# Patient Record
Sex: Female | Born: 1970 | Race: Black or African American | Hispanic: No | Marital: Single | Smoking: Never smoker
Health system: Southern US, Community
[De-identification: ages and names within clinical notes are randomized; demographics above are authoritative.]

## PROBLEM LIST (undated history)

## (undated) DIAGNOSIS — E119 Type 2 diabetes mellitus without complications: Secondary | ICD-10-CM

## (undated) DIAGNOSIS — Z923 Personal history of irradiation: Secondary | ICD-10-CM

## (undated) DIAGNOSIS — F419 Anxiety disorder, unspecified: Secondary | ICD-10-CM

## (undated) DIAGNOSIS — Z9221 Personal history of antineoplastic chemotherapy: Secondary | ICD-10-CM

## (undated) DIAGNOSIS — J45909 Unspecified asthma, uncomplicated: Secondary | ICD-10-CM

## (undated) DIAGNOSIS — I10 Essential (primary) hypertension: Secondary | ICD-10-CM

## (undated) DIAGNOSIS — M1990 Unspecified osteoarthritis, unspecified site: Secondary | ICD-10-CM

## (undated) DIAGNOSIS — Z800 Family history of malignant neoplasm of digestive organs: Secondary | ICD-10-CM

## (undated) DIAGNOSIS — Z803 Family history of malignant neoplasm of breast: Secondary | ICD-10-CM

## (undated) DIAGNOSIS — F32A Depression, unspecified: Secondary | ICD-10-CM

## (undated) DIAGNOSIS — R519 Headache, unspecified: Secondary | ICD-10-CM

## (undated) DIAGNOSIS — I639 Cerebral infarction, unspecified: Secondary | ICD-10-CM

## (undated) DIAGNOSIS — F329 Major depressive disorder, single episode, unspecified: Secondary | ICD-10-CM

## (undated) HISTORY — DX: Family history of malignant neoplasm of digestive organs: Z80.0

## (undated) HISTORY — DX: Family history of malignant neoplasm of breast: Z80.3

## (undated) HISTORY — PX: TONSILLECTOMY: SUR1361

## (undated) HISTORY — PX: DILATION AND CURETTAGE OF UTERUS: SHX78

## (undated) HISTORY — PX: BREAST BIOPSY: SHX20

---

## 2000-09-15 ENCOUNTER — Encounter: Admission: RE | Admit: 2000-09-15 | Discharge: 2000-09-15 | Payer: Self-pay | Admitting: Family Medicine

## 2000-09-15 ENCOUNTER — Encounter: Payer: Self-pay | Admitting: Family Medicine

## 2003-06-05 ENCOUNTER — Other Ambulatory Visit: Admission: RE | Admit: 2003-06-05 | Discharge: 2003-06-05 | Payer: Self-pay | Admitting: Family Medicine

## 2004-11-06 ENCOUNTER — Other Ambulatory Visit: Admission: RE | Admit: 2004-11-06 | Discharge: 2004-11-06 | Payer: Self-pay | Admitting: Family Medicine

## 2004-11-30 ENCOUNTER — Encounter: Admission: RE | Admit: 2004-11-30 | Discharge: 2004-11-30 | Payer: Self-pay | Admitting: Family Medicine

## 2005-11-09 ENCOUNTER — Other Ambulatory Visit: Admission: RE | Admit: 2005-11-09 | Discharge: 2005-11-09 | Payer: Self-pay | Admitting: Family Medicine

## 2006-07-09 ENCOUNTER — Emergency Department (HOSPITAL_COMMUNITY): Admission: EM | Admit: 2006-07-09 | Discharge: 2006-07-09 | Payer: Self-pay | Admitting: Emergency Medicine

## 2006-11-11 ENCOUNTER — Other Ambulatory Visit: Admission: RE | Admit: 2006-11-11 | Discharge: 2006-11-11 | Payer: Self-pay | Admitting: Family Medicine

## 2006-11-23 ENCOUNTER — Encounter: Admission: RE | Admit: 2006-11-23 | Discharge: 2006-11-23 | Payer: Self-pay | Admitting: Family Medicine

## 2008-04-09 ENCOUNTER — Other Ambulatory Visit: Admission: RE | Admit: 2008-04-09 | Discharge: 2008-04-09 | Payer: Self-pay | Admitting: Obstetrics and Gynecology

## 2008-11-08 ENCOUNTER — Emergency Department (HOSPITAL_COMMUNITY): Admission: EM | Admit: 2008-11-08 | Discharge: 2008-11-08 | Payer: Self-pay | Admitting: Emergency Medicine

## 2009-05-06 ENCOUNTER — Other Ambulatory Visit: Admission: RE | Admit: 2009-05-06 | Discharge: 2009-05-06 | Payer: Self-pay | Admitting: Family Medicine

## 2010-05-16 ENCOUNTER — Ambulatory Visit (INDEPENDENT_AMBULATORY_CARE_PROVIDER_SITE_OTHER): Payer: BC Managed Care – PPO

## 2010-05-16 ENCOUNTER — Inpatient Hospital Stay (INDEPENDENT_AMBULATORY_CARE_PROVIDER_SITE_OTHER)
Admission: RE | Admit: 2010-05-16 | Discharge: 2010-05-16 | Disposition: A | Discharge status: Home / Self Care | Payer: BC Managed Care – PPO | Source: Ambulatory Visit | Attending: Family Medicine | Admitting: Family Medicine

## 2010-05-16 DIAGNOSIS — J40 Bronchitis, not specified as acute or chronic: Secondary | ICD-10-CM

## 2010-05-16 DIAGNOSIS — J019 Acute sinusitis, unspecified: Secondary | ICD-10-CM

## 2010-05-16 LAB — POCT RAPID STREP A (OFFICE): Streptococcus, Group A Screen (Direct): NEGATIVE

## 2010-06-05 LAB — POCT URINALYSIS DIP (DEVICE)
Bilirubin Urine: NEGATIVE
Glucose, UA: NEGATIVE mg/dL
Ketones, ur: NEGATIVE mg/dL
Nitrite: NEGATIVE
Protein, ur: NEGATIVE mg/dL
Specific Gravity, Urine: 1.015 (ref 1.005–1.030)
Urobilinogen, UA: 0.2 mg/dL (ref 0.0–1.0)
pH: 5.5 (ref 5.0–8.0)

## 2011-03-12 ENCOUNTER — Telehealth: Payer: Self-pay | Admitting: *Deleted

## 2011-03-12 NOTE — Telephone Encounter (Signed)
Opened in error

## 2011-07-06 ENCOUNTER — Other Ambulatory Visit: Payer: Self-pay | Admitting: Family Medicine

## 2011-07-06 DIAGNOSIS — Z1231 Encounter for screening mammogram for malignant neoplasm of breast: Secondary | ICD-10-CM

## 2011-07-11 ENCOUNTER — Emergency Department (HOSPITAL_COMMUNITY): Payer: BC Managed Care – PPO

## 2011-07-11 ENCOUNTER — Encounter (HOSPITAL_COMMUNITY): Payer: Self-pay | Admitting: Emergency Medicine

## 2011-07-11 ENCOUNTER — Inpatient Hospital Stay (HOSPITAL_COMMUNITY)
Admission: EM | Admit: 2011-07-11 | Discharge: 2011-07-14 | DRG: 427 | Disposition: A | Discharge status: Home / Self Care | Payer: BC Managed Care – PPO | Attending: Internal Medicine | Admitting: Internal Medicine

## 2011-07-11 DIAGNOSIS — G459 Transient cerebral ischemic attack, unspecified: Secondary | ICD-10-CM

## 2011-07-11 DIAGNOSIS — F1010 Alcohol abuse, uncomplicated: Secondary | ICD-10-CM | POA: Diagnosis present

## 2011-07-11 DIAGNOSIS — I10 Essential (primary) hypertension: Secondary | ICD-10-CM

## 2011-07-11 DIAGNOSIS — K5730 Diverticulosis of large intestine without perforation or abscess without bleeding: Secondary | ICD-10-CM | POA: Diagnosis present

## 2011-07-11 DIAGNOSIS — D509 Iron deficiency anemia, unspecified: Secondary | ICD-10-CM

## 2011-07-11 DIAGNOSIS — R531 Weakness: Secondary | ICD-10-CM

## 2011-07-11 DIAGNOSIS — D649 Anemia, unspecified: Secondary | ICD-10-CM

## 2011-07-11 DIAGNOSIS — Z91199 Patient's noncompliance with other medical treatment and regimen due to unspecified reason: Secondary | ICD-10-CM

## 2011-07-11 DIAGNOSIS — F451 Undifferentiated somatoform disorder: Principal | ICD-10-CM | POA: Diagnosis present

## 2011-07-11 DIAGNOSIS — K589 Irritable bowel syndrome without diarrhea: Secondary | ICD-10-CM | POA: Diagnosis present

## 2011-07-11 DIAGNOSIS — Z9119 Patient's noncompliance with other medical treatment and regimen: Secondary | ICD-10-CM

## 2011-07-11 DIAGNOSIS — E669 Obesity, unspecified: Secondary | ICD-10-CM | POA: Diagnosis present

## 2011-07-11 DIAGNOSIS — Z79899 Other long term (current) drug therapy: Secondary | ICD-10-CM

## 2011-07-11 DIAGNOSIS — K219 Gastro-esophageal reflux disease without esophagitis: Secondary | ICD-10-CM | POA: Diagnosis present

## 2011-07-11 DIAGNOSIS — Z791 Long term (current) use of non-steroidal anti-inflammatories (NSAID): Secondary | ICD-10-CM

## 2011-07-11 DIAGNOSIS — F1410 Cocaine abuse, uncomplicated: Secondary | ICD-10-CM | POA: Diagnosis present

## 2011-07-11 DIAGNOSIS — E538 Deficiency of other specified B group vitamins: Secondary | ICD-10-CM

## 2011-07-11 HISTORY — DX: Essential (primary) hypertension: I10

## 2011-07-11 LAB — CBC
HCT: 21 % — ABNORMAL LOW (ref 36.0–46.0)
Hemoglobin: 5.7 g/dL — CL (ref 12.0–15.0)
MCV: 63.4 fL — ABNORMAL LOW (ref 78.0–100.0)
Platelets: 279 10*3/uL (ref 150–400)
RBC: 3.31 MIL/uL — ABNORMAL LOW (ref 3.87–5.11)
WBC: 11.2 10*3/uL — ABNORMAL HIGH (ref 4.0–10.5)

## 2011-07-11 LAB — DIFFERENTIAL
Basophils Relative: 0 % (ref 0–1)
Eosinophils Relative: 6 % — ABNORMAL HIGH (ref 0–5)
Lymphs Abs: 2.6 10*3/uL (ref 0.7–4.0)
Monocytes Relative: 8 % (ref 3–12)
Neutro Abs: 7 10*3/uL (ref 1.7–7.7)

## 2011-07-11 LAB — RETICULOCYTES: Retic Count, Absolute: 50.4 10*3/uL (ref 19.0–186.0)

## 2011-07-11 LAB — COMPREHENSIVE METABOLIC PANEL
Albumin: 3.8 g/dL (ref 3.5–5.2)
Alkaline Phosphatase: 75 U/L (ref 39–117)
BUN: 12 mg/dL (ref 6–23)
Chloride: 101 mEq/L (ref 96–112)
Potassium: 3.6 mEq/L (ref 3.5–5.1)
Total Bilirubin: 0.3 mg/dL (ref 0.3–1.2)

## 2011-07-11 LAB — RAPID URINE DRUG SCREEN, HOSP PERFORMED
Amphetamines: NOT DETECTED
Cocaine: POSITIVE — AB
Opiates: NOT DETECTED
Tetrahydrocannabinol: NOT DETECTED

## 2011-07-11 LAB — POCT I-STAT, CHEM 8
Chloride: 104 mEq/L (ref 96–112)
Glucose, Bld: 126 mg/dL — ABNORMAL HIGH (ref 70–99)
HCT: 22 % — ABNORMAL LOW (ref 36.0–46.0)
Potassium: 3.4 mEq/L — ABNORMAL LOW (ref 3.5–5.1)

## 2011-07-11 LAB — PREPARE RBC (CROSSMATCH)

## 2011-07-11 LAB — CK TOTAL AND CKMB (NOT AT ARMC): Total CK: 97 U/L (ref 7–177)

## 2011-07-11 LAB — OCCULT BLOOD, POC DEVICE: Fecal Occult Bld: NEGATIVE

## 2011-07-11 MED ORDER — ALBUTEROL SULFATE (5 MG/ML) 0.5% IN NEBU: 2.5000 mg | INHALATION_SOLUTION | RESPIRATORY_TRACT | Status: DC | PRN

## 2011-07-11 MED ORDER — ASPIRIN EC 325 MG PO TBEC
325.0000 mg | DELAYED_RELEASE_TABLET | Freq: Every day | ORAL | Status: DC
  Administered 2011-07-11 – 2011-07-12 (×2): 325 mg via ORAL
  Filled 2011-07-11 (×3): qty 1

## 2011-07-11 MED ORDER — SODIUM CHLORIDE 0.9 % IJ SOLN
3.0000 mL | Freq: Two times a day (BID) | INTRAMUSCULAR | Status: DC
  Administered 2011-07-11 – 2011-07-14 (×5): 3 mL via INTRAVENOUS

## 2011-07-11 MED ORDER — GUAIFENESIN-DM 100-10 MG/5ML PO SYRP
5.0000 mL | ORAL_SOLUTION | ORAL | Status: DC | PRN
  Administered 2011-07-12 – 2011-07-13 (×2): 5 mL via ORAL
  Filled 2011-07-11 (×2): qty 5

## 2011-07-11 MED ORDER — ONDANSETRON HCL 4 MG/2ML IJ SOLN: 4.0000 mg | Freq: Four times a day (QID) | INTRAMUSCULAR | Status: DC | PRN

## 2011-07-11 MED ORDER — SENNOSIDES-DOCUSATE SODIUM 8.6-50 MG PO TABS: 1.0000 | ORAL_TABLET | Freq: Every evening | ORAL | Status: DC | PRN

## 2011-07-11 MED ORDER — CHLORDIAZEPOXIDE HCL 5 MG PO CAPS
10.0000 mg | ORAL_CAPSULE | Freq: Three times a day (TID) | ORAL | Status: DC
  Administered 2011-07-11 – 2011-07-14 (×8): 10 mg via ORAL
  Filled 2011-07-11 (×2): qty 1
  Filled 2011-07-11 (×3): qty 2
  Filled 2011-07-11 (×4): qty 1
  Filled 2011-07-11: qty 2

## 2011-07-11 MED ORDER — LORAZEPAM 1 MG PO TABS: 1.0000 mg | ORAL_TABLET | Freq: Four times a day (QID) | ORAL | Status: DC | PRN

## 2011-07-11 MED ORDER — ONDANSETRON HCL 4 MG PO TABS: 4.0000 mg | ORAL_TABLET | Freq: Four times a day (QID) | ORAL | Status: DC | PRN

## 2011-07-11 MED ORDER — ASPIRIN 81 MG PO CHEW
324.0000 mg | CHEWABLE_TABLET | Freq: Once | ORAL | Status: AC
  Administered 2011-07-11: 324 mg via ORAL
  Filled 2011-07-11: qty 4

## 2011-07-11 MED ORDER — LORAZEPAM 2 MG/ML IJ SOLN
1.0000 mg | Freq: Four times a day (QID) | INTRAMUSCULAR | Status: DC | PRN
  Administered 2011-07-12: 1 mg via INTRAVENOUS

## 2011-07-11 MED ORDER — VITAMIN B-1 100 MG PO TABS
100.0000 mg | ORAL_TABLET | Freq: Every day | ORAL | Status: DC
  Administered 2011-07-12 – 2011-07-14 (×3): 100 mg via ORAL
  Filled 2011-07-11 (×4): qty 1

## 2011-07-11 MED ORDER — ADULT MULTIVITAMIN W/MINERALS CH
1.0000 | ORAL_TABLET | Freq: Every day | ORAL | Status: DC
  Administered 2011-07-11 – 2011-07-14 (×4): 1 via ORAL
  Filled 2011-07-11 (×4): qty 1

## 2011-07-11 MED ORDER — FAMOTIDINE 20 MG PO TABS
20.0000 mg | ORAL_TABLET | Freq: Two times a day (BID) | ORAL | Status: DC
  Administered 2011-07-11 – 2011-07-14 (×6): 20 mg via ORAL
  Filled 2011-07-11 (×7): qty 1

## 2011-07-11 MED ORDER — FOLIC ACID 1 MG PO TABS
1.0000 mg | ORAL_TABLET | Freq: Every day | ORAL | Status: DC
  Administered 2011-07-11 – 2011-07-14 (×4): 1 mg via ORAL
  Filled 2011-07-11 (×4): qty 1

## 2011-07-11 MED ORDER — HYDROCODONE-ACETAMINOPHEN 5-325 MG PO TABS
1.0000 | ORAL_TABLET | ORAL | Status: DC | PRN
  Administered 2011-07-12 – 2011-07-14 (×3): 2 via ORAL
  Filled 2011-07-11 (×3): qty 2

## 2011-07-11 MED ORDER — THIAMINE HCL 100 MG/ML IJ SOLN
100.0000 mg | Freq: Every day | INTRAMUSCULAR | Status: DC
  Administered 2011-07-11: 100 mg via INTRAVENOUS
  Filled 2011-07-11 (×2): qty 1

## 2011-07-11 NOTE — ED Notes (Signed)
Calling report now. 

## 2011-07-11 NOTE — ED Notes (Addendum)
Report given to Ajsa, RN.  No further questions/concerns from RN.  RN informed that we are waiting on blood bank to call about ready units.  Informed RN that she can call with any questions/concerns once patient arrives to floor.  Preparing patient for transport.

## 2011-07-11 NOTE — ED Notes (Signed)
[facility]G 132 Rn notified leslie

## 2011-07-11 NOTE — H&P (Signed)
[facility]                                                                                     Patient Demographics  Gioia Ranes, is a 41 y.o. female  CSN: [id number]  MRN: [id number]  DOB - Dec 19, 1970  Admit Date - 07/11/2011  Outpatient Primary MD for the patient is Evlyn Courier, MD, MD   With History of -  Past Medical History  Diagnosis Date  . Hypertension       History reviewed. No pertinent past surgical history.  in for   Chief Complaint  Patient presents with  . Code Stroke     HPI  An Lannan  is a 41 y.o. female, right-handed African American, who has chronic history of hypertension, anemia, heavy menstural periods which are ongoing, who is undergoing a lot of personal stress in life for the past few weeks, results to the ER with history of right-sided arm and leg pain and weakness, some slurred speech, which happened about 6 hours ago and is now almost completely resolved, she says that she was in a barbecue event in her backyard when this happened, she denies any headache denies any problems with vision or hearing, she presented to the ER where her head CT was unremarkable however her blood work was suggestive of severe anemia with low MCV MCH and MCHC, good stroke was called she was seen by Dr. Elita Boone neurologist and cleared from the point of view of code stroke i.e. no TPA and I was called to admit the patient.    Review of Systems  currently the review systems is completely negative.  In addition to the HPI above,   No Fever-chills, No Headache, No changes with Vision or hearing, No problems swallowing food or Liquids, No Chest pain, Cough or Shortness of Breath, No Abdominal pain, No Nausea or Vommitting, Bowel movements are regular, No Blood in stool or Urine, No dysuria, No new skin rashes or bruises, No new joints pains-aches,  No new weakness, tingling, numbness in  any extremity, No recent weight gain or loss, No polyuria, polydypsia or polyphagia, She's not suicidal or homicidal  A full 10 point Review of Systems was done, except as stated above, all other Review of Systems were negative.   Social History History  Substance Use Topics  . Smoking status: Never Smoker   . Smokeless tobacco: Not on file  . Alcohol Use: No      Family History Positive for stroke in her mother   Prior to Admission medications   Medication Sig Start Date End Date Taking? Authorizing Provider  dicyclomine (BENTYL) 10 MG capsule Take 10 mg by mouth 4 (four) times daily -  before meals and at bedtime.   Yes Historical Provider, MD  famotidine (PEPCID) 20 MG tablet Take 20 mg by mouth daily.   Yes Historical Provider, MD  hydrochlorothiazide (HYDRODIURIL) 25 MG tablet Take 25 mg by mouth daily.   Yes Historical Provider, MD    No Known Allergies  Physical Exam  Vitals  Blood pressure 148/78, pulse 90, temperature 98.2 F (36.8 C), temperature source Oral, resp. rate 20, last  menstrual period 06/30/2011, SpO2 100.00%.   1. General middle-aged obese African American female  lying in bed in NAD,     2. Normal affect and insight, Not Suicidal or Homicidal, Awake Alert, Oriented *3.  3. No F.N deficits, ALL C.Nerves Intact, Strength 5/5 all 4 extremities, Sensation intact all 4 extremities, Plantars down going.  4. Ears and Eyes appear Normal, Conjunctivae clear, PERRLA. Moist Oral Mucosa.  5. Supple Neck, No JVD, No cervical lymphadenopathy appriciated, No Carotid Bruits.  6. Symmetrical Chest wall movement, Good air movement bilaterally, CTAB.  7. RRR, No Gallops, Rubs or Murmurs, No Parasternal Heave.  8. Positive Bowel Sounds, Abdomen Soft, Non tender, No organomegaly appriciated,       No rebound -guarding or rigidity.  9.  No Cyanosis, Normal Skin Turgor, No Skin Rash or Bruise.  10. Good muscle tone,  joints appear normal , no effusions, Normal  ROM.  11. No Palpable Lymph Nodes in Neck or Axillae     Data Review  CBC  Lab 07/11/11 1705 07/11/11 1652  WBC -- 11.2*  HGB 7.5* 5.7*  HCT 22.0* 21.0*  PLT -- 279  MCV -- 63.4*  MCH -- 17.2*  MCHC -- 27.1*  RDW -- 21.4*  LYMPHSABS -- 2.6  MONOABS -- 0.9  EOSABS -- 0.7  BASOSABS -- 0.0  BANDABS -- --   ------------------------------------------------------------------------------------------------------------------ Chemistries   Lab 07/11/11 1705 07/11/11 1652  NA 137 135  K 3.4* 3.6  CL 104 101  CO2 -- 21  GLUCOSE 126* 121*  BUN 12 12  CREATININE 0.70 0.75  CALCIUM -- 9.3  MG -- --  AST -- 16  ALT -- 7  ALKPHOS -- 75  BILITOT -- 0.3   ------------------------------------------------------------------------------------------------------------------ CrCl is unknown because there is no height on file for the current visit. ------------------------------------------------------------------------------------------------------------------ No results found for this basename: TSH,T4TOTAL,FREET3,T3FREE,THYROIDAB in the last 72 hours  Coagulation profile  Lab 07/11/11 1652  INR 1.01  PROTIME --   ------------------------------------------------------------------------------------------------------------------- No results found for this basename: DDIMER:2 in the last 72 hours ------------------------------------------------------------------------------------------------------------------- Cardiac Enzymes  Lab 07/11/11 1653  CKMB 1.4  TROPONINI <0.30  MYOGLOBIN --   ------------------------------------------------------------------------------------------------------------------ No components found with this basename: POCBNP:3 ------------------------------------------------------------------------------------------------------------------  Imaging results:   Ct Head Wo Contrast  07/11/2011  *RADIOLOGY REPORT*  Clinical Data: Right-sided weakness, acute  onset  CT HEAD WITHOUT CONTRAST  Technique:  Contiguous axial images were obtained from the base of the skull through the vertex without contrast.  Comparison: None.  Findings: No acute intracranial hemorrhage.  No focal mass lesion. No CT evidence of acute infarction.   No midline shift or mass effect.  No hydrocephalus.  Basilar cisterns are patent. Paranasal sinuses and mastoid air cells are clear.  Orbits are normal.  IMPRESSION: No acute intracranial findings. No intracranial hemorrhage or CT evidence of infarction. Essentially normal head CT  Findings conveyed to Dr. Preston Fleeting 07/11/2011 at to 1655 hours  Original Report Authenticated By: Genevive Bi, M.D.    My personal review of EKG: Rhythm NSR, no Acute ST changes     Assessment & Plan  1.Right-sided arm and leg pain with weakness along with some slurred speech which happened 6 hours prior to hospital presentation symptoms now almost completely resolved. Head CT negative. Patient cleared by neurologist.-At this time her history and physical exam are not consistent with any organic etiology, her symptoms are more suggestive of somatoform pathology arising from a small mental stress, patient herself admits to this to  some extent, however as suggested by neurology we'll complete TIA workup including MRI MRA of the brain, A1c, lipid panel, PT OT and speech evaluation. We'll place her on aspirin for now.   2. Iron deficiency anemia which is severe- vision says that she has been told before that she has iron deficiency anemia, does have history of heavy menstrual periods, will obtain anemia panel, 1 units of packed RBC transfusion, the H&H in the morning, he is Hemoccult negative, outpatient anemia workup by PCP to continue.   3. Patient will history of alcohol use patient states that she drinks 1-2 bottles of beer a week however her father tells me that she drinks 7-8 bottles of beer a day, will place her on folic acid and thiamine, CIWA protocol  .   4. Hypertension no acute issues for now no medications as TIAs in the differential outpatient followup.    DVT Prophylaxis  SCDs    AM Labs Ordered, also please review Full Orders  Admission, patients condition and plan of care including tests being ordered have been discussed with the patient and father who indicate understanding and agree with the plan and Code Status.  Code Status Full  Condition Marinell Blight K M.D on 07/11/2011 at 7:23 PM  [facility] Office  [phone]

## 2011-07-11 NOTE — Consult Note (Signed)
Reason for Consult:Reponse to Code Stroke Referring Physician: Dr Preston Fleeting (ED physician_  CC: right sided parathesiae HPI: Cathy Parrish is an 41 y.o. female who has minimal hypetension controlled with HCTZ,  She stopped her aspirin fo no known reason as she just forgot to take it.  She can tolerate aspirin.  Today following church she went to a cook out at her pastor's house.  About 1 PM she felt a vague change of sensation beginning in her right leg shich felt cold.  I slowly ascend to her torso and arm but not her face.  She sat down and had a soda and she noted her right index finger flexed on it's ownw.  It stayed like this for an extended period of time.  The pt did not improve much and the pastor's wife suggested she might be having a stroke and she brought the pt to the [facility].  She was triaged by Dr Preston Fleeting and a Code Stroke was called.    The rapid reponse nurse, Angelique Blonder reponded, the pt had a CT which showed no acute changes and I saw the pt with [facility] in [place], room 13.   Past Medical History  Diagnosis Date  . Hypertension     History reviewed. No pertinent past surgical history.  No family history on file.  Social History:  reports that she has never smoked. She does not have any smokeless tobacco history on file. She reports that she does not drink alcohol or use illicit drugs.  No Known Allergies  Medications: Prior to Admission:  HCTZ, pt stopped aspirin weeks ago.  ROS *History obtained from the patient  General ROS: negative for - chills, fatigue, fever, night sweats, weight gain or weight loss Psychological ROS: negative for - behavioral disorder, hallucinations, memory difficulties, mood swings or suicidal ideation Ophthalmic ROS: negative for - blurry vision, double vision, eye pain or loss of vision ENT ROS: negative for - epistaxis, nasal discharge, oral lesions, sore throat, tinnitus or vertigo Allergy and Immunology ROS: negative for - hives or itchy/watery  eyes Hematological and Lymphatic ROS: negative for - bleeding problems, bruising or swollen lymph nodes Endocrine ROS: negative for - galactorrhea, hair pattern changes, polydipsia/polyuria or temperature intolerance Respiratory ROS: negative for - cough, hemoptysis, shortness of breath or wheezing Cardiovascular ROS: negative for - chest pain, dyspnea on exertion, edema or irregular heartbeat Gastrointestinal ROS: negative for - abdominal pain, diarrhea, hematemesis, nausea/vomiting or stool incontinence Genito-Urinary ROS: negative for - dysuria, hematuria, incontinence or urinary frequency/urgency Musculoskeletal ROS: negative for - joint swelling or muscular weakness Neurological ROS: as noted in HPI Dermatological ROS: negative for rash and skin lesion changes * Physical Examination: Blood pressure 148/78, pulse 90, temperature 98.2 F (36.8 C), temperature source Oral, resp. rate 20, last menstrual period 06/30/2011, SpO2 100.00%.  Neurologic Examination *Mental Status: Alert, oriented, thought content appropriate.  Speech fluent without evidence of aphasia.  Able to follow 3 step commands without difficulty. Cranial Nerves: II: visual fields grossly normal, pupils equal, round, reactive to light. III,IV, VI: ptosis not present, extra-ocular motions intact bilaterally without nystagmus V,VII: smile symmetric, facial light touch sensation normal bilaterally VIII: hearing normal bilaterally to speech IX,X: gag reflex not tested XI: trapezius strength/neck flexion strength normal bilaterally XII: tongue strength normal  Motor: Right :  Left Upper extremity   5/5  Upper extremity 5/5                                                                                                                                                                             Lower extremity   5/5  Lower extremity   5/5 Tone and bulk:normal tone throughout; no  atrophy noted Sensory: Light touch intact throughout, bilaterally Deep Tendon Reflexes: 1+ and symmetric throughout Plantars: Right: downgoing   Left: downgoing Cerebellar: normal finger-to-nose normal gait and station, not tested   Results for orders placed during the hospital encounter of 07/11/11 (from the past 48 hour(s))  PROTIME-INR     Status: Normal   Collection Time   07/11/11  4:52 PM      Component Value Range Comment   Prothrombin Time 13.5  11.6 - 15.2 (seconds)    INR 1.01  0.00 - 1.49    APTT     Status: Normal   Collection Time   07/11/11  4:52 PM      Component Value Range Comment   aPTT 25  24 - 37 (seconds)   CBC     Status: Abnormal   Collection Time   07/11/11  4:52 PM      Component Value Range Comment   WBC 11.2 (*) 4.0 - 10.5 (K/uL)    RBC 3.31 (*) 3.87 - 5.11 (MIL/uL)    Hemoglobin 5.7 (*) 12.0 - 15.0 (g/dL)    HCT 16.1 (*) 09.6 - 46.0 (%)    MCV 63.4 (*) 78.0 - 100.0 (fL)    MCH 17.2 (*) 26.0 - 34.0 (pg)    MCHC 27.1 (*) 30.0 - 36.0 (g/dL)    RDW 04.5 (*) 40.9 - 15.5 (%)    Platelets 279  150 - 400 (K/uL)   DIFFERENTIAL     Status: Normal (Preliminary result)   Collection Time   07/11/11  4:52 PM      Component Value Range Comment   Neutrophils Relative PENDING  43 - 77 (%)    Neutro Abs PENDING  1.7 - 7.7 (K/uL)    Band Neutrophils PENDING  0 - 10 (%)    Lymphocytes Relative PENDING  12 - 46 (%)    Lymphs Abs PENDING  0.7 - 4.0 (K/uL)    Monocytes Relative PENDING  3 - 12 (%)    Monocytes Absolute PENDING  0.1 - 1.0 (K/uL)    Eosinophils Relative PENDING  0 - 5 (%)    Eosinophils Absolute PENDING  0.0 - 0.7 (K/uL)    Basophils Relative PENDING  0 - 1 (%)    Basophils Absolute PENDING  0.0 - 0.1 (K/uL)  WBC Morphology PENDING      RBC Morphology PENDING      Smear Review PENDING      nRBC PENDING  0 (/100 WBC)    Metamyelocytes Relative PENDING      Myelocytes PENDING      Promyelocytes Absolute PENDING      Blasts PENDING       COMPREHENSIVE METABOLIC PANEL     Status: Abnormal   Collection Time   07/11/11  4:52 PM      Component Value Range Comment   Sodium 135  135 - 145 (mEq/L)    Potassium 3.6  3.5 - 5.1 (mEq/L)    Chloride 101  96 - 112 (mEq/L)    CO2 21  19 - 32 (mEq/L)    Glucose, Bld 121 (*) 70 - 99 (mg/dL)    BUN 12  6 - 23 (mg/dL)    Creatinine, Ser 1.61  0.50 - 1.10 (mg/dL)    Calcium 9.3  8.4 - 10.5 (mg/dL)    Total Protein 8.0  6.0 - 8.3 (g/dL)    Albumin 3.8  3.5 - 5.2 (g/dL)    AST 16  0 - 37 (U/L)    ALT 7  0 - 35 (U/L)    Alkaline Phosphatase 75  39 - 117 (U/L)    Total Bilirubin 0.3  0.3 - 1.2 (mg/dL)    GFR calc non Af Amer >90  >90 (mL/min)    GFR calc Af Amer >90  >90 (mL/min)   GLUCOSE, CAPILLARY     Status: Abnormal   Collection Time   07/11/11  5:02 PM      Component Value Range Comment   Glucose-Capillary 132 (*) 70 - 99 (mg/dL)   POCT I-STAT, CHEM 8     Status: Abnormal   Collection Time   07/11/11  5:05 PM      Component Value Range Comment   Sodium 137  135 - 145 (mEq/L)    Potassium 3.4 (*) 3.5 - 5.1 (mEq/L)    Chloride 104  96 - 112 (mEq/L)    BUN 12  6 - 23 (mg/dL)    Creatinine, Ser 0.96  0.50 - 1.10 (mg/dL)    Glucose, Bld 045 (*) 70 - 99 (mg/dL)    Calcium, Ion 4.09  1.12 - 1.32 (mmol/L)    TCO2 23  0 - 100 (mmol/L)    Hemoglobin 7.5 (*) 12.0 - 15.0 (g/dL)    HCT 81.1 (*) 91.4 - 46.0 (%)   OCCULT BLOOD, POC DEVICE     Status: Normal   Collection Time   07/11/11  5:31 PM      Component Value Range Comment   Fecal Occult Bld NEGATIVE       No results found for this or any previous visit (from the past 240 hour(s)).  Ct Head Wo Contrast  07/11/2011  *RADIOLOGY REPORT*  Clinical Data: Right-sided weakness, acute onset  CT HEAD WITHOUT CONTRAST  Technique:  Contiguous axial images were obtained from the base of the skull through the vertex without contrast.  Comparison: None.  Findings: No acute intracranial hemorrhage.  No focal mass lesion. No CT evidence of  acute infarction.   No midline shift or mass effect.  No hydrocephalus.  Basilar cisterns are patent. Paranasal sinuses and mastoid air cells are clear.  Orbits are normal.  IMPRESSION: No acute intracranial findings. No intracranial hemorrhage or CT evidence of infarction. Essentially normal head CT  Findings conveyed to Dr.  Preston Fleeting 07/11/2011 at to 1655 hours  Original Report Authenticated By: Genevive Bi, M.D.     Assessment/Plan:  TIA Mild controlled hypertension  Plan: Discussed with Dr Preston Fleeting and nurse Angelique Blonder and we all concurred pt should have a TIA work up in the ED.  If appropriate she could then be discharged home on aspirin and HCTZ.  I requested her ED nurse to give her an aspirin in the ED.  Jehieli Brassell A. Elita Boone, MD   07/11/2011, 5:38 PM

## 2011-07-11 NOTE — ED Notes (Signed)
Received bedside report from [place], [place].  Patient currently sitting up in bed; no respiratory or acute distress noted.  Patient updated on plan of care; informed patient that we are currently waiting on a bed assignment.  Patient has no other questions or concerns at this time; family present at bedside.  Will continue to monitor.

## 2011-07-11 NOTE — ED Notes (Addendum)
C/o pain, numbness, and weakness to R arm and leg that started at 1600 while eating.  Friend reports pt did have slurred speech that has now resolved.  Speech clear at this time.  Friend also reports pt seems forgetful over the last 30 min.  Pt alert and oriented but slow to answer some questions.  R grip weaker than L.  Pt does have R arm drift.  On initial assessment of arm drift pt immediately dropped R arm when arms were raised (appeared arm was flaccid).  She opened eyes and looked at R arm and then when arm drift was reassessed pt able to have more control over R arm but does still have a slight drift. Code stroke initiated and pt straight to CT with Woodroe Chen, RN. Dr. Weldon Inches assessed airway at [facility].

## 2011-07-11 NOTE — ED Provider Notes (Signed)
History     CSN: [id number]  Arrival date & time 07/11/11  1627   First MD Initiated Contact with Patient 07/11/11 1642      Chief Complaint  Patient presents with  . Code Stroke    (Consider location/radiation/quality/duration/timing/severity/associated sxs/prior treatment) The history is provided by the patient.   41 year old female had onset at about 2 PM of her right arm feeling heavy and achy and she was unable to use her arm. She did not notice any leg problem. She was having some difficulty speaking and that she was slurring her words. There is no headache and no nausea or vomiting and no chest pain. She's never had any episodes like this before. Symptoms have been stable.  Past Medical History  Diagnosis Date  . Hypertension     History reviewed. No pertinent past surgical history.  No family history on file.  History  Substance Use Topics  . Smoking status: Never Smoker   . Smokeless tobacco: Not on file  . Alcohol Use: No    OB History    Grav Para Term Preterm Abortions TAB SAB Ect Mult Living                  Review of Systems  All other systems reviewed and are negative.    Allergies  Review of patient's allergies indicates no known allergies.  Home Medications   Current Outpatient Rx  Name Route Sig Dispense Refill  . DICYCLOMINE HCL 10 MG PO CAPS Oral Take 10 mg by mouth 4 (four) times daily -  before meals and at bedtime.    Marland Kitchen FAMOTIDINE 20 MG PO TABS Oral Take 20 mg by mouth daily.    Marland Kitchen HYDROCHLOROTHIAZIDE 25 MG PO TABS Oral Take 25 mg by mouth daily.      BP 148/78  Pulse 90  Temp(Src) 98.2 F (36.8 C) (Oral)  Resp 20  SpO2 100%  LMP 06/30/2011  Physical Exam  Nursing note and vitals reviewed.  preterm female is resting comfortably and in no acute distress. Vital signs are normal. Oxygen saturation is 100% which is normal. Head is normocephalic and atraumatic. PERRLA, EOMI. Oropharynx is clear. There is no facial asymmetry. Neck is  nontender and supple without adenopathy or bruit. Lungs are clear without rales, wheezes, rhonchi. Heart has regular rate rhythm without murmur. Abdomen is soft, flat, nontender without masses or hepatosplenomegaly. Extremities have full range of motion, no cyanosis or edema. Skin is warm and dry without rash. Neurologic: Mental status is normal. Speech is normal. Cranial nerves are intact and there is no facial droop seen.. There is moderate weakness of the right arm which is 3/5. There is mild weakness of the right leg which is 4/5. There is mild pronator drift of the right arm. No sensory deficits are noted.  ED Course  Procedures (including critical care time)  Labs Reviewed  GLUCOSE, CAPILLARY - Abnormal; Notable for the following:    Glucose-Capillary 132 (*)    All other components within normal limits  POCT I-STAT, CHEM 8 - Abnormal; Notable for the following:    Potassium 3.4 (*)    Glucose, Bld 126 (*)    Hemoglobin 7.5 (*)    HCT 22.0 (*)    All other components within normal limits  PROTIME-INR  APTT  CBC  DIFFERENTIAL  COMPREHENSIVE METABOLIC PANEL  CK TOTAL AND CKMB  TROPONIN I  URINE RAPID DRUG SCREEN (HOSP PERFORMED)   Ct Head Wo Contrast  07/11/2011  *  RADIOLOGY REPORT*  Clinical Data: Right-sided weakness, acute onset  CT HEAD WITHOUT CONTRAST  Technique:  Contiguous axial images were obtained from the base of the skull through the vertex without contrast.  Comparison: None.  Findings: No acute intracranial hemorrhage.  No focal mass lesion. No CT evidence of acute infarction.   No midline shift or mass effect.  No hydrocephalus.  Basilar cisterns are patent. Paranasal sinuses and mastoid air cells are clear.  Orbits are normal.  IMPRESSION: No acute intracranial findings. No intracranial hemorrhage or CT evidence of infarction. Essentially normal head CT  Findings conveyed to Dr. Preston Fleeting 07/11/2011 at to 1655 hours  Original Report Authenticated By: Genevive Bi, M.D.      1. Transient ischemic attack (TIA)   2. Anemia       MDM   acute stroke. Code stroke was called and the section was taken to CT scan where CT was read as negative by radiologist. I discussed the findings with the radiologist and reviewed the images. After return to the room, and she was seen by the neurologist at which point she had returned to completely normal neurologic status. Apparently this is a TIA and the neurologist recommends a TIA workup. Code stroke was canceled.   Hemoglobin has come back 5.7. This would make her ineligible for the CDU protocol for a TIA workup. Rectal exam was done and there is a small amount of stool which has normal color and has been sent for Hemoccult testing, which was negative. She does tell me that she has been told she has anemia and has been blamed on menstrual blood loss but she does not know what her baseline hemoglobin is.  Case is discussed with Dr. Thedore Mins who agrees to admit the patient. She will be given blood transfusion after that has been drawn for anemia evaluation. Note is made of target cells and stomatocytes on peripheral smear suggesting that she may actually have an underlying hemoglobinopathy.  CRITICAL CARE Performed by: Dione Booze   Total critical care time: 40 minutes  Critical care time was exclusive of separately billable procedures and treating other patients.  Critical care was necessary to treat or prevent imminent or life-threatening deterioration.  Critical care was time spent personally by me on the following activities: development of treatment plan with patient and/or surrogate as well as nursing, discussions with consultants, evaluation of patient's response to treatment, examination of patient, obtaining history from patient or surrogate, ordering and performing treatments and interventions, ordering and review of laboratory studies, ordering and review of radiographic studies, pulse oximetry and re-evaluation of  patient's condition.        Dione Booze, MD 07/11/11 [phone]

## 2011-07-11 NOTE — Code Documentation (Signed)
Code stroke called at 1638, patient arrived to [facility] at 1627, stroke team arrived at 1650, Patient to CT scan at 1643 lab at 1653, CT read at 1655.  As per patient, she went to church And then went to pastors house for a cook out.  At about 1400 she started to feel funny and she sat down, at 1500 she got a sharp pain down her right arm, then arm and leg felt heavy.  Pastor's  Wife drove her to [facility].  NIHSS 3, which has since resolved.  Cancelled at 1718

## 2011-07-12 ENCOUNTER — Inpatient Hospital Stay (HOSPITAL_COMMUNITY): Payer: BC Managed Care – PPO

## 2011-07-12 DIAGNOSIS — D509 Iron deficiency anemia, unspecified: Secondary | ICD-10-CM

## 2011-07-12 DIAGNOSIS — G459 Transient cerebral ischemic attack, unspecified: Secondary | ICD-10-CM

## 2011-07-12 DIAGNOSIS — Z791 Long term (current) use of non-steroidal anti-inflammatories (NSAID): Secondary | ICD-10-CM

## 2011-07-12 DIAGNOSIS — E538 Deficiency of other specified B group vitamins: Secondary | ICD-10-CM | POA: Diagnosis present

## 2011-07-12 DIAGNOSIS — F1010 Alcohol abuse, uncomplicated: Secondary | ICD-10-CM

## 2011-07-12 LAB — CBC
HCT: 22.3 % — ABNORMAL LOW (ref 36.0–46.0)
Hemoglobin: 6.4 g/dL — CL (ref 12.0–15.0)
MCHC: 28.7 g/dL — ABNORMAL LOW (ref 30.0–36.0)
RBC: 3.34 MIL/uL — ABNORMAL LOW (ref 3.87–5.11)

## 2011-07-12 LAB — LIPID PANEL
Cholesterol: 144 mg/dL (ref 0–200)
Triglycerides: 109 mg/dL (ref <150)
VLDL: 22 mg/dL (ref 0–40)

## 2011-07-12 LAB — BASIC METABOLIC PANEL
BUN: 10 mg/dL (ref 6–23)
Chloride: 102 mEq/L (ref 96–112)
GFR calc Af Amer: >90 mL/min (ref >90)
GFR calc non Af Amer: >90 mL/min (ref >90)
Potassium: 3.3 mEq/L — ABNORMAL LOW (ref 3.5–5.1)
Sodium: 136 mEq/L (ref 135–145)

## 2011-07-12 LAB — D-DIMER, QUANTITATIVE: D-Dimer, Quant: 0.67 ug/mL-FEU — ABNORMAL HIGH (ref 0.00–0.48)

## 2011-07-12 LAB — HEMOGLOBIN A1C
Hgb A1c MFr Bld: 6.2 % — ABNORMAL HIGH (ref <5.7)
Mean Plasma Glucose: 131 mg/dL — ABNORMAL HIGH (ref <117)

## 2011-07-12 LAB — PROTIME-INR
INR: 1.03 (ref 0.00–1.49)
Prothrombin Time: 13.7 seconds (ref 11.6–15.2)

## 2011-07-12 LAB — IRON AND TIBC
Iron: 13 ug/dL — ABNORMAL LOW (ref 42–135)
UIBC: 568 ug/dL — ABNORMAL HIGH (ref 125–400)

## 2011-07-12 LAB — PATHOLOGIST SMEAR REVIEW

## 2011-07-12 LAB — FOLATE: Folate: 13.2 ng/mL

## 2011-07-12 LAB — VITAMIN B12: Vitamin B-12: 135 pg/mL — ABNORMAL LOW (ref 211–911)

## 2011-07-12 LAB — HAPTOGLOBIN: Haptoglobin: 147 mg/dL (ref 45–215)

## 2011-07-12 MED ORDER — SODIUM CHLORIDE 0.9 % IV SOLN
1250.0000 mg | Freq: Once | INTRAVENOUS | Status: AC
  Administered 2011-07-12: 1250 mg via INTRAVENOUS
  Filled 2011-07-12 (×2): qty 25

## 2011-07-12 MED ORDER — METOCLOPRAMIDE HCL 5 MG/ML IJ SOLN
10.0000 mg | Freq: Once | INTRAMUSCULAR | Status: AC
  Administered 2011-07-12: 10 mg via INTRAVENOUS
  Filled 2011-07-12: qty 2

## 2011-07-12 MED ORDER — SODIUM CHLORIDE 0.45 % IV SOLN
Freq: Once | INTRAVENOUS | Status: AC
  Administered 2011-07-12: 22:00:00 via INTRAVENOUS

## 2011-07-12 MED ORDER — CYANOCOBALAMIN 1000 MCG/ML IJ SOLN
1000.0000 ug | Freq: Every day | INTRAMUSCULAR | Status: DC
  Administered 2011-07-12 – 2011-07-13 (×2): 1000 ug via INTRAMUSCULAR
  Filled 2011-07-12 (×2): qty 1

## 2011-07-12 MED ORDER — BISACODYL 5 MG PO TBEC
20.0000 mg | DELAYED_RELEASE_TABLET | Freq: Once | ORAL | Status: AC
  Administered 2011-07-12: 20 mg via ORAL
  Filled 2011-07-12: qty 4

## 2011-07-12 MED ORDER — METOCLOPRAMIDE HCL 5 MG/ML IJ SOLN
10.0000 mg | Freq: Once | INTRAMUSCULAR | Status: AC
  Administered 2011-07-13: 10 mg via INTRAVENOUS
  Filled 2011-07-12: qty 2

## 2011-07-12 MED ORDER — IRON DEXTRAN 50 MG/ML IJ SOLN
25.0000 mg | Freq: Once | INTRAMUSCULAR | Status: AC
  Administered 2011-07-12: 25 mg via INTRAVENOUS
  Filled 2011-07-12: qty 0.5

## 2011-07-12 MED ORDER — PEG 3350-KCL-NA BICARB-NACL 420 G PO SOLR
2000.0000 mL | Freq: Once | ORAL | Status: AC
  Administered 2011-07-12: 2000 mL via ORAL
  Filled 2011-07-12: qty 4000

## 2011-07-12 MED ORDER — CYANOCOBALAMIN 1000 MCG/ML IJ SOLN
1000.0000 ug | Freq: Once | INTRAMUSCULAR | Status: DC
  Filled 2011-07-12: qty 1

## 2011-07-12 MED ORDER — POTASSIUM CHLORIDE CRYS ER 20 MEQ PO TBCR
40.0000 meq | EXTENDED_RELEASE_TABLET | Freq: Once | ORAL | Status: AC
  Administered 2011-07-12: 40 meq via ORAL
  Filled 2011-07-12: qty 2

## 2011-07-12 MED ORDER — PEG 3350-KCL-NA BICARB-NACL 420 G PO SOLR
2000.0000 mL | Freq: Once | ORAL | Status: DC
  Filled 2011-07-12: qty 4000

## 2011-07-12 MED ORDER — SODIUM CHLORIDE 0.9 % IV SOLN
Freq: Once | INTRAVENOUS | Status: AC
  Administered 2011-07-12: 19:00:00 via INTRAVENOUS

## 2011-07-12 NOTE — Progress Notes (Signed)
Pt briefly screened for speech/language/cognitive deficits. Pt WFL, reports no concerns. Will defer full eval and complete order. If concerns arise please reorder. Thanks, Harlon Ditty, MA CCC-SLP [phone]

## 2011-07-12 NOTE — Progress Notes (Addendum)
[facility]                                                                                 Patient Demographics  Cathy Parrish, is a 41 y.o. female  ZOX:[id number]  [id number]  DOB - Jun 27, 1970  Admit date - 07/11/2011  Admitting Physician Leroy Sea, MD  Outpatient Primary MD for the patient is Evlyn Courier, MD, MD  LOS - 1     Chief Complaint  Patient presents with  . Code Stroke        Subjective:   Cathy Parrish today has, No headache, No chest pain, No abdominal pain - No Nausea, No new weakness tingling or numbness, No Cough - SOB.    Objective:   Filed Vitals:   07/12/11 0130 07/12/11 0225 07/12/11 0500 07/12/11 0644  BP: 116/59 121/71 126/68 119/74  Pulse: 80 71 80 79  Temp: 98.9 F (37.2 C) 98.9 F (37.2 C) 98.5 F (36.9 C) 98.2 F (36.8 C)  TempSrc: Oral Oral Oral Oral  Resp: 18 18 18 19   SpO2:   100% 97%    Wt Readings from Last 3 Encounters:  No data found for Wt     Intake/Output Summary (Last 24 hours) at 07/12/11 1146 Last data filed at 07/12/11 0225  Gross per 24 hour  Intake    100 ml  Output      0 ml  Net    100 ml    Exam Awake Alert, Oriented *3, No new F.N deficits, Normal affect [place].AT,PERRAL Supple Neck,No JVD, No cervical lymphadenopathy appriciated.  Symmetrical Chest wall movement, Good air movement bilaterally, CTAB RRR,No Gallops,Rubs or new Murmurs, No Parasternal Heave +ve B.Sounds, Abd Soft, Non tender, No organomegaly appriciated, No rebound -guarding or rigidity. No Cyanosis, Clubbing or edema, No new Rash or bruise    Data Review  CBC  Lab 07/12/11 0710 07/11/11 1705 07/11/11 1652  WBC 8.6 -- 11.2*  HGB 6.4* 7.5* 5.7*  HCT 22.3* 22.0* 21.0*  PLT 230 -- 279  MCV 66.8* -- 63.4*  MCH 19.2* -- 17.2*  MCHC 28.7* -- 27.1*  RDW 22.4* -- 21.4*  LYMPHSABS -- -- 2.6  MONOABS -- -- 0.9  EOSABS -- -- 0.7  BASOSABS -- -- 0.0  BANDABS -- -- --    Chemistries   Lab  07/12/11 0710 07/11/11 1705 07/11/11 1652  NA 136 137 135  K 3.3* 3.4* 3.6  CL 102 104 101  CO2 24 -- 21  GLUCOSE 101* 126* 121*  BUN 10 12 12   CREATININE 0.70 0.70 0.75  CALCIUM 9.0 -- 9.3  MG -- -- --  AST -- -- 16  ALT -- -- 7  ALKPHOS -- -- 75  BILITOT -- -- 0.3   ------------------------------------------------------------------------------------------------------------------ CrCl is unknown because there is no height on file for the current visit. ------------------------------------------------------------------------------------------------------------------ No results found for this basename: HGBA1C:2 in the last 72 hours ------------------------------------------------------------------------------------------------------------------  [facility] 07/12/11 0710  CHOL 144  HDL 44  LDLCALC 78  TRIG 109  CHOLHDL 3.3  LDLDIRECT --   ------------------------------------------------------------------------------------------------------------------ No results found for this basename: TSH,T4TOTAL,FREET3,T3FREE,THYROIDAB in the last 72 hours ------------------------------------------------------------------------------------------------------------------  [facility] 07/11/11  1842  VITAMINB12 135*  FOLATE 13.2  FERRITIN 3*  TIBC 581*  IRON 13*  RETICCTPCT 1.6    Coagulation profile  Lab 07/12/11 0924 07/12/11 0710 07/11/11 1652  INR 0.99 1.03 1.01  PROTIME -- -- --     Basename 07/12/11 0924  DDIMER 0.67*    Cardiac Enzymes  Lab 07/11/11 1653  CKMB 1.4  TROPONINI <0.30  MYOGLOBIN --   ------------------------------------------------------------------------------------------------------------------ No components found with this basename: POCBNP:3  Micro Results No results found for this or any previous visit (from the past 240 hour(s)).  Radiology Reports Ct Head Wo Contrast  07/11/2011  *RADIOLOGY REPORT*  Clinical Data: Right-sided weakness, acute onset   CT HEAD WITHOUT CONTRAST  Technique:  Contiguous axial images were obtained from the base of the skull through the vertex without contrast.  Comparison: None.  Findings: No acute intracranial hemorrhage.  No focal mass lesion. No CT evidence of acute infarction.   No midline shift or mass effect.  No hydrocephalus.  Basilar cisterns are patent. Paranasal sinuses and mastoid air cells are clear.  Orbits are normal.  IMPRESSION: No acute intracranial findings. No intracranial hemorrhage or CT evidence of infarction. Essentially normal head CT  Findings conveyed to Dr. Preston Fleeting 07/11/2011 at to 1655 hours  Original Report Authenticated By: Genevive Bi, M.D.    Scheduled Meds:   . aspirin  324 mg Oral Once  . aspirin EC  325 mg Oral Daily  . chlordiazePOXIDE  10 mg Oral TID  . famotidine  20 mg Oral BID  . folic acid  1 mg Oral Daily  . mulitivitamin with minerals  1 tablet Oral Daily  . sodium chloride  3 mL Intravenous Q12H  . thiamine  100 mg Oral Daily  . DISCONTD: thiamine  100 mg Intravenous Daily   Continuous Infusions:  PRN Meds:.albuterol, guaiFENesin-dextromethorphan, HYDROcodone-acetaminophen, LORazepam, LORazepam, ondansetron (ZOFRAN) IV, ondansetron, senna-docusate  Assessment & Plan   1. Right-sided weakness which was likely related to stress/somatoform in origin- head CT unremarkable, symptoms completely resolved, this does not appear to be a neurological event, discussed with Dr. Pearlean Brownie, for now carotid duplex is stable, echo pending, will complete MRI MRA of the brain, A1c and lipid panel, no swallowing problems cleared by speech therapist, PTOT to follow. On aspirin.  No results found for this basename: HGBA1C    Lab Results  Component Value Date   CHOL 144 07/12/2011   HDL 44 07/12/2011   LDLCALC 78 07/12/2011   TRIG 109 07/12/2011   CHOLHDL 3.3 07/12/2011      2. Severe iron deficiency anemia- likely from heavy menstrual periods, occult negative x1 will repeat, LDH  normal, fibrinogen normal, peripheral smear discussed with hematologist Dr. Dimas Millin Y suggest IV iron replacement and 2 units of blood transfusion aching total 3 units since admission and no further workup at this time, if anemia persists she would like to see the patient in the office will request PCP to monitor, GI to see the patient although I doubt she is having any acute GI blood loss. Continue PPI and H&H monitoring.    3. Hypertension no acute issues outpatient followup with PCP.     4. History of alcohol and cocaine abuse patient counseled continue Cipro protocol and Librium.    DVT Prophylaxis SCDs      Leroy Sea M.D on 07/12/2011 at 11:46 AM  [facility] Office  [phone]

## 2011-07-12 NOTE — Progress Notes (Signed)
PT/OT Cancellation Note  Treatment cancelled today due to medical issues with patient which prohibited therapy.  Pt Hgb currently 6.4 therefore will hold PT/OT evaluations at this time.    Cathy Parrish 07/12/2011, 9:14 AM Jake Shark, PT DPT [phone]

## 2011-07-12 NOTE — Progress Notes (Signed)
  Echocardiogram 2D Echocardiogram has been performed.  Lenzie Sandler, Real Cons 07/12/2011, 10:44 AM

## 2011-07-12 NOTE — Progress Notes (Signed)
Pt has received one unit of PRBC's, Pt tolerated well. Will continue to monitor Pt and transfuse another unit. Rema Fendt, RN

## 2011-07-12 NOTE — Consult Note (Signed)
[facility] Consultation  Referring Provider: [facility] Primary Care Physician:  Evlyn Courier, MD, MD Primary Gastroenterologist:   none Reason for Consultation:  Anemia  HPI: Cathy Parrish is a 41 y.o. female admitted with right sided weakness, slurred speech. Head CTscan negative. Neurology evaluated her and TIA is in list of differential diagnoses. Labs have revealed profound microcytic anemia.  Her hemoglobin was 5.7. Ferritin is 3. Blood smear reveals large platelets. Her WBC and platelets are normal. Folate in normal range, B12 low at 135.  Patient gives a long history of anemia, she stopped taking her iron pills several months ago. She describes heavy menstrual cycles. Her PCP diagnosed her with IBS 2 months ago. Her symptoms have included constipation and mid abdominal pain unrelated to meals or defecation. She hasn't ever seen a gastroenterologist. Other GI history includes GERD for which she takes Pepcid as needed. She describes occasional black stools but relates that to iron and also Bismuth. Patient has taken daily Goody Powders for years. She has had some voluntary weight loss.   Past Medical History  Diagnosis Date  . Hypertension     Past Surgical History  Procedure Date  . Dilation and curettage of uterus abortion    Prior to Admission medications   Medication Sig Start Date End Date Taking? Authorizing Provider  dicyclomine (BENTYL) 10 MG capsule Take 10 mg by mouth 4 (four) times daily -  before meals and at bedtime.   Yes Historical Provider, MD  famotidine (PEPCID) 20 MG tablet Take 20 mg by mouth daily.   Yes Historical Provider, MD  hydrochlorothiazide (HYDRODIURIL) 25 MG tablet Take 25 mg by mouth daily.   Yes Historical Provider, MD    Current Facility-Administered Medications  Medication Dose Route Frequency Provider Last Rate Last Dose  . albuterol (PROVENTIL) (5 MG/ML) 0.5% nebulizer solution 2.5 mg  2.5 mg Nebulization Q2H PRN Leroy Sea, MD      . aspirin chewable tablet 324 mg  324 mg Oral Once Dione Booze, MD   324 mg at 07/11/11 1727  . aspirin EC tablet 325 mg  325 mg Oral Daily Leroy Sea, MD   325 mg at 07/12/11 1056  . chlordiazePOXIDE (LIBRIUM) capsule 10 mg  10 mg Oral TID Leroy Sea, MD   10 mg at 07/12/11 1056  . famotidine (PEPCID) tablet 20 mg  20 mg Oral BID Leroy Sea, MD   20 mg at 07/12/11 1056  . folic acid (FOLVITE) tablet 1 mg  1 mg Oral Daily Leroy Sea, MD   1 mg at 07/12/11 1056  . guaiFENesin-dextromethorphan (ROBITUSSIN DM) 100-10 MG/5ML syrup 5 mL  5 mL Oral Q4H PRN Leroy Sea, MD      . HYDROcodone-acetaminophen (NORCO) 5-325 MG per tablet 1-2 tablet  1-2 tablet Oral Q4H PRN Leroy Sea, MD   2 tablet at 07/12/11 1057  . iron dextran complex (INFED) 25 mg in sodium chloride 0.9 % 50 mL IVPB  25 mg Intravenous Once Leroy Sea, MD       Followed by  . iron dextran complex (INFED) 1,250 mg in sodium chloride 0.9 % 500 mL IVPB  1,250 mg Intravenous Once Leroy Sea, MD      . LORazepam (ATIVAN) tablet 1 mg  1 mg Oral Q6H PRN Leroy Sea, MD       Or  . LORazepam (ATIVAN) injection 1 mg  1 mg Intravenous Q6H PRN Prashant  Curlene Labrum, MD      . mulitivitamin with minerals tablet 1 tablet  1 tablet Oral Daily Leroy Sea, MD   1 tablet at 07/12/11 1056  . ondansetron (ZOFRAN) tablet 4 mg  4 mg Oral Q6H PRN Leroy Sea, MD       Or  . ondansetron (ZOFRAN) injection 4 mg  4 mg Intravenous Q6H PRN Leroy Sea, MD      . senna-docusate (Senokot-S) tablet 1 tablet  1 tablet Oral QHS PRN Leroy Sea, MD      . sodium chloride 0.9 % injection 3 mL  3 mL Intravenous Q12H Leroy Sea, MD   3 mL at 07/12/11 1057  . thiamine (VITAMIN B-1) tablet 100 mg  100 mg Oral Daily Leroy Sea, MD   100 mg at 07/12/11 1056  . DISCONTD: thiamine (B-1) injection 100 mg  100 mg Intravenous Daily Leroy Sea, MD   100 mg at 07/11/11 2256     Allergies as of 07/11/2011  . (No Known Allergies)    FMH:  Aunt with colon cancer in her 70's.  History   Social History  . Marital Status: Single            .     Occupational History  . [employer]   Social History Main Topics  . Smoking status: Never Smoker   . Smokeless tobacco: Not on file  . Alcohol Use: 1.8 oz/week    3 Cans of beer per week     twice a week  . Drug Use: No  . Sexually Active: Yes    Birth Control/ Protection: Condom      Review of Systems: Positive for fatigue, mild dizziness. All other systems reviewed and negative except where noted in HPI PHYSICAL EXAM: Vital signs in last 24 hours: Temp:  [97.8 F (36.6 C)-98.9 F (37.2 C)] 98.6 F (37 C) (05/13 1230) Pulse Rate:  [59-98] 90  (05/13 1230) Resp:  [10-22] 20  (05/13 1230) BP: (116-148)/(58-85) 132/76 mmHg (05/13 1230) SpO2:  [97 %-100 %] 100 % (05/13 1000) Weight:  [211 lb (95.709 kg)] 211 lb (95.709 kg) (05/13 1230) Last BM Date: 07/10/11 General:   Pleasant black female in NAD Head:  Normocephalic and atraumatic. Eyes:   No icterus.   Conjunctiva pale. Ears:  Normal auditory acuity. Neck:  Supple; no masses felt Lungs:  Respirations even and unlabored. Lungs clear to auscultation bilaterally.   No wheezes, crackles, or rhonchi.  Heart:  Regular rate and rhythm; no murmurs heard. Abdomen:  Soft, nondistended, nontender. Normal bowel sounds. No appreciable masses or hepatomegaly.  Rectal:  Not performed.  Msk:  Symmetrical without gross deformities.  Extremities:  Without edema. Neurologic:  Alert and  oriented;  grossly normal neurologically. Skin:  Intact without significant lesions or rashes. Cervical Nodes:  No significant cervical adenopathy. Psych:  Alert and cooperative. Normal affect.  LAB RESULTS:  Basename 07/12/11 0710 07/11/11 1705 07/11/11 1652  WBC 8.6 -- 11.2*  HGB 6.4* 7.5* 5.7*  HCT 22.3* 22.0* 21.0*  PLT 230 -- 279   BMET  Basename 07/12/11  0710 07/11/11 1705 07/11/11 1652  NA 136 137 135  K 3.3* 3.4* 3.6  CL 102 104 101  CO2 24 -- 21  GLUCOSE 101* 126* 121*  BUN 10 12 12   CREATININE 0.70 0.70 0.75  CALCIUM 9.0 -- 9.3   LFT  Basename 07/11/11 1652  PROT 8.0  ALBUMIN 3.8  AST 16  ALT 7  ALKPHOS 75  BILITOT 0.3  BILIDIR --  IBILI --   PT/INR  Basename 07/12/11 0924 07/12/11 0710  LABPROT 13.3 13.7  INR 0.99 1.03    STUDIES: Ct Head Wo Contrast  07/11/2011  *RADIOLOGY REPORT*  Clinical Data: Right-sided weakness, acute onset  CT HEAD WITHOUT CONTRAST  Technique:  Contiguous axial images were obtained from the base of the skull through the vertex without contrast.  Comparison: None.  Findings: No acute intracranial hemorrhage.  No focal mass lesion. No CT evidence of acute infarction.   No midline shift or mass effect.  No hydrocephalus.  Basilar cisterns are patent. Paranasal sinuses and mastoid air cells are clear.  Orbits are normal.  IMPRESSION: No acute intracranial findings. No intracranial hemorrhage or CT evidence of infarction. Essentially normal head CT  Findings conveyed to Dr. Preston Fleeting 07/11/2011 at to 1655 hours  Original Report Authenticated By: Genevive Bi, M.D.     PREVIOUS ENDOSCOPIES: none  IMPRESSION / PLAN: 1. Profound iron deficiency anemia with ferritin of 3, hgb of 5.7. Anemia could be secondary to heavy menses and discontinuation of iron but need to exclude PUD in setting NSAIDS,, colon neoplasm, AVMs, malabsorption. She is currently getting a blood transfusion.  Patient needs both an upper and lower endoscopy for further evaluation. The risks, benefits, and alternatives to EGD and colonoscopy with possible biopsies and possible polypectomy were discussed with the patient and she consents to proceed.    2. Two month history of constipation and mid abdominal pain felt by PCP to be IBS (per patient). She may in fact have IBS but need to exclude colon neoplasm.  3. GERD, takes Pepcid as  needed for heartburn.   4.  B12 deficiency, ?secondary to malabsorption, ?pernicious anemia. Might this have caused her neurologic symptoms?.    Thanks   LOS: 1 day   Willette Cluster  07/12/2011, 1:06 PM  [facility] Attending:  I have personally taken a history and examined the patient and ave no additions to above note.   Iron and B12 deficiency anemia in setting of chronic Goody's. Non-compliant with iron due to constipation. B12 deficiency is new.  ? Malabsorption or nutritional issues, blood loss (iron only).  Plan for EGD and colonoscopy tomorrow PM. The risks and benefits as well as alternatives of endoscopic procedure(s) have been discussed and reviewed. All questions answered. The patient agrees to proceed.  Also start B12 injections, check folate. Check TTG Ab and IgA level and probably small bowel bxs. May need anti-parietal and anti intrinsic factor antibodies (achlorhydria could also cause combined Fe deficiency and B12 deficiency). At least some of iron-deficiency from menses, it seems,. She may need to consider intervention for this.  I appreciate the opportunity to care for this patient.  Iva Boop, MD, [facility] [phone] (pager) 07/12/2011 4:09 PM   UJ:[id number],JYNWGN Kirtland Bouchard, MD

## 2011-07-12 NOTE — Progress Notes (Signed)
Agree with below statement  Lucile Shutters   OTR/L Pager: [phone] Office: [phone] .

## 2011-07-12 NOTE — Progress Notes (Signed)
VASCULAR LAB PRELIMINARY  PRELIMINARY  PRELIMINARY  PRELIMINARY  Carotid duplex  completed.    Preliminary report:  Bilateral:  No evidence of hemodynamically significant internal carotid artery stenosis.   Vertebral artery flow is antegrade.     Terance Hart, RVT 07/12/2011, 9:45 AM

## 2011-07-12 NOTE — Progress Notes (Signed)
Pharmacy - IV Iron  Current HgB = 6.4   Iron deficit calculated as 1250 mg  Plan: 1) Iron dextran test dose  2) Iron dextran 1250 mg IV x 1 3) Follow up in AM  Thank you. Okey Regal, PharmD

## 2011-07-13 ENCOUNTER — Encounter (HOSPITAL_COMMUNITY)
Admission: EM | Disposition: A | Discharge status: Home / Self Care | Payer: Self-pay | Source: Home / Self Care | Attending: Internal Medicine

## 2011-07-13 ENCOUNTER — Encounter (HOSPITAL_COMMUNITY): Payer: Self-pay

## 2011-07-13 DIAGNOSIS — F1010 Alcohol abuse, uncomplicated: Secondary | ICD-10-CM

## 2011-07-13 DIAGNOSIS — G459 Transient cerebral ischemic attack, unspecified: Secondary | ICD-10-CM

## 2011-07-13 DIAGNOSIS — D509 Iron deficiency anemia, unspecified: Secondary | ICD-10-CM

## 2011-07-13 HISTORY — PX: ESOPHAGOGASTRODUODENOSCOPY: SHX5428

## 2011-07-13 HISTORY — PX: COLONOSCOPY: SHX5424

## 2011-07-13 LAB — TYPE AND SCREEN
ABO/RH(D): O POS
Antibody Screen: NEGATIVE
Unit division: 0

## 2011-07-13 LAB — HEMOGLOBIN AND HEMATOCRIT, BLOOD: HCT: 28.1 % — ABNORMAL LOW (ref 36.0–46.0)

## 2011-07-13 LAB — POTASSIUM: Potassium: 3.8 meq/L (ref 3.5–5.1)

## 2011-07-13 LAB — FOLATE: Folate: 17.9 ng/mL

## 2011-07-13 SURGERY — COLONOSCOPY
Anesthesia: Moderate Sedation

## 2011-07-13 SURGERY — COLONOSCOPY WITH ESOPHAGOGASTRODUODENOSCOPY (EGD)
Anesthesia: Moderate Sedation

## 2011-07-13 MED ORDER — MAGNESIUM CITRATE PO SOLN
0.5000 | Freq: Once | ORAL | Status: AC
  Administered 2011-07-13: 0.5 via ORAL
  Filled 2011-07-13: qty 296

## 2011-07-13 MED ORDER — MIDAZOLAM HCL 10 MG/2ML IJ SOLN
INTRAMUSCULAR | Status: DC | PRN
  Administered 2011-07-13: 2 mg via INTRAVENOUS
  Administered 2011-07-13: 1 mg via INTRAVENOUS
  Administered 2011-07-13 (×2): 2 mg via INTRAVENOUS
  Administered 2011-07-13: 1 mg via INTRAVENOUS
  Administered 2011-07-13: 2 mg via INTRAVENOUS

## 2011-07-13 MED ORDER — FENTANYL NICU IV SYRINGE 50 MCG/ML
INJECTION | INTRAMUSCULAR | Status: DC | PRN
  Administered 2011-07-13 (×5): 25 ug via INTRAVENOUS

## 2011-07-13 MED ORDER — FENTANYL CITRATE 0.05 MG/ML IJ SOLN
INTRAMUSCULAR | Status: AC
  Filled 2011-07-13: qty 4

## 2011-07-13 MED ORDER — BUTAMBEN-TETRACAINE-BENZOCAINE 2-2-14 % EX AERO
INHALATION_SPRAY | CUTANEOUS | Status: DC | PRN
  Administered 2011-07-13: 2 via TOPICAL

## 2011-07-13 MED ORDER — FERROUS SULFATE 325 (65 FE) MG PO TABS: 325.0000 mg | ORAL_TABLET | Freq: Two times a day (BID) | ORAL | Status: DC

## 2011-07-13 MED ORDER — FOLIC ACID 1 MG PO TABS: 1.0000 mg | ORAL_TABLET | Freq: Every day | ORAL | Status: AC

## 2011-07-13 MED ORDER — MIDAZOLAM HCL 10 MG/2ML IJ SOLN
INTRAMUSCULAR | Status: AC
  Filled 2011-07-13: qty 4

## 2011-07-13 MED ORDER — CYANOCOBALAMIN 1000 MCG/ML IJ SOLN: 1000.0000 ug | Freq: Every day | INTRAMUSCULAR | Status: AC

## 2011-07-13 MED ORDER — CYANOCOBALAMIN 1000 MCG/ML IJ SOLN
1000.0000 ug | Freq: Once | INTRAMUSCULAR | Status: AC
  Administered 2011-07-13: 1000 ug via INTRAMUSCULAR
  Filled 2011-07-13: qty 1

## 2011-07-13 MED ORDER — THIAMINE HCL 100 MG PO TABS: 100.0000 mg | ORAL_TABLET | Freq: Every day | ORAL | Status: AC

## 2011-07-13 MED ORDER — FERROUS SULFATE 325 (65 FE) MG PO TABS
325.0000 mg | ORAL_TABLET | Freq: Two times a day (BID) | ORAL | Status: DC
  Administered 2011-07-13 – 2011-07-14 (×2): 325 mg via ORAL
  Filled 2011-07-13 (×4): qty 1

## 2011-07-13 NOTE — Op Note (Addendum)
[name] [address] [place], [place]  [address]  ENDOSCOPY PROCEDURE REPORT  PATIENT:  Cathy, Parrish  MR#:  [id number] BIRTHDATE:  May 09, 1970, 40 yrs. old  GENDER:  female  ENDOSCOPIST:  Iva Boop, MD, [facility] Referred by:          Hospitalist  PROCEDURE DATE:  07/13/2011 PROCEDURE:  EGD with biopsy, 02725 ASA CLASS:  Class II INDICATIONS:  iron deficiency anemia and B12 deficiency  MEDICATIONS:   Fentanyl 75 mcg IV, Versed 7.5 mg IV Correction 9 mg IV TOPICAL ANESTHETIC:  Cetacaine Spray  DESCRIPTION OF PROCEDURE:   After the risks benefits and alternatives of the procedure were thoroughly explained, informed consent was obtained.  The Pentax Gastroscope [id number] endoscope was introduced through the mouth and advanced to the second portion of the duodenum, without limitations.  The instrument was slowly withdrawn as the mucosa was fully examined. <<PROCEDUREIMAGES>>  A nodule was found antrum Two nodules or polyps, one 1 cm the other 2-3 mm. White exudates on top. Larger nodule biopsied. Otherwise the examination was normal. Duodenal biopsied taken to look for celiac disease. Random biopsies were obtained and sent to pathology.    Retroflexed views revealed no abnormalities.    The scope was then withdrawn from the patient and the procedure completed.  COMPLICATIONS:  None  ENDOSCOPIC IMPRESSION: 1) Nodules in the antrum- biopsied 2) Otherwise normal examination RECOMMENDATIONS: 1) Await biopsy results 2) Proceed with a Colonscopy.  Iva Boop, MD, [facility]  CC:  Mirna Mires, MD  n. REVISED:  07/13/2011 04:09 PM eSIGNED:   Iva Boop at 07/13/2011 04:09 PM  Dorathy Daft, [phone]

## 2011-07-13 NOTE — Progress Notes (Signed)
Patient wants to be discharged in am. Dr. Thedore Mins was notified and ordered ok to go home tomorrow. Telemetry was d/c per MD order. Able to ate dinner well. Pt on stable condition. Instructed pt to inject Vitamin B12 injection by herself and was able to do it.

## 2011-07-13 NOTE — Progress Notes (Signed)
Pt. Unable to complete Golytely bowel prep. Drank 1/2 of first bottle. Pt. Refused to drink any further.  Dr. Leone Payor was notified, new orders were received. Pt. given 2 500cc tap water enemas and 0.5 bottle Mag Citrate. Pt. Tolerated well. Will monitor.   Julianne Rice, RN

## 2011-07-13 NOTE — Clinical Social Work Note (Signed)
Clinical Social Worker received "substance abuse." CSW attempted to meet with and conduct psychosocial assessment and SBIRT. Pt was off floor and endoscopy per nurse. CSW will attempt again in the morning. Please call with any urgent needs. CSW will continue follow.   Dede Query, MSW, Theresia Majors [phone]

## 2011-07-13 NOTE — Progress Notes (Signed)
Pt given 1 mg IV Ativan while in MRI. Med taken out of PYXIS in radiology. 1 mg Ativan wasted by Salvadore Oxford, RN and witnessed by Benjiman Core, RN.

## 2011-07-13 NOTE — Evaluation (Signed)
Occupational Therapy Evaluation Patient Details Name: ARLIE POSCH MRN: [id number] DOB: 25-May-1970 Today's Date: 07/13/2011 Time: 4098-1191 OT Time Calculation (min): 20 min  OT Assessment / Plan / Recommendation Clinical Impression  Pt admitted with c/o Rt-sided weakness which soon resolved, found to be anemic. Pt reports she feels near baseline functiong; however, noticed that pt slightly staggers/loses balance when walking in room (pt with no falls/able to self-correct)- pt states this present at baseline    OT Assessment  Patient needs continued OT Services    Follow Up Recommendations  No OT follow up    Barriers to Discharge      Equipment Recommendations   (TBD- do not anticipate any needs)    Recommendations for Other Services    Frequency  Min 2X/week    Precautions / Restrictions Precautions Precautions: Fall Restrictions Weight Bearing Restrictions: No   Pertinent Vitals/Pain No c/o pain    ADL  Eating/Feeding: Simulated;Independent Where Assessed - Eating/Feeding: Edge of bed Grooming: Performed;Modified independent;Wash/dry hands (leaned on sink for support) Where Assessed - Grooming: Standing at sink Upper Body Bathing: Simulated;Supervision/safety Where Assessed - Upper Body Bathing: Standing at sink Lower Body Bathing: Simulated (Min guard A) Where Assessed - Lower Body Bathing: Standing at sink Upper Body Dressing: Simulated;Independent Where Assessed - Upper Body Dressing: Sitting, chair;Sitting, bed Lower Body Dressing: Simulated;Supervision/safety Where Assessed - Lower Body Dressing: Sit to stand from bed Toilet Transfer: Performed;Supervision/safety (with lines and pt slightly unsteady) Toilet Transfer Method: Proofreader: Regular height toilet;Grab bars Toileting - Clothing Manipulation: Performed;Independent Where Assessed - Toileting Clothing Manipulation: Standing Toileting - Hygiene: Performed;Independent Where  Assessed - Toileting Hygiene: Sit on 3-in-1 or toilet Tub/Shower Transfer: Simulated;Minimal assistance Tub/Shower Transfer Method: Ambulating Ambulation Related to ADLs: Min guard A/Supervision as pt staggering x 2, but able to self-correct ADL Comments: Pt is at/near baseline functioning, but because of pt's unsteadiness, I am concerned about her returning home. Will await PT eval to make final decisions regarding need for OT, but do not anticipate any needs    OT Diagnosis: Generalized weakness  OT Problem List: Decreased activity tolerance;Decreased knowledge of use of DME or AE OT Treatment Interventions: Self-care/ADL training;Balance training;DME and/or AE instruction   OT Goals Acute Rehab OT Goals OT Goal Formulation: With patient Time For Goal Achievement: 07/20/11 Potential to Achieve Goals: Good ADL Goals Pt Will Perform Tub/Shower Transfer: Tub transfer;with modified independence;Ambulation ADL Goal: Tub/Shower Transfer - Progress: Goal set today Additional ADL Goal #1: Pt will be I with all ADL item retrieval ADL Goal: Additional Goal #1 - Progress: Goal set today  Visit Information  Last OT Received On: 07/13/11 Assistance Needed: +1    Subjective Data  Subjective: I think everything is kind of getting back to normal. 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[employer] Communication: No difficulties Dominant Hand: Right    Cognition   Overall Cognitive Status: Appears within functional limits for tasks assessed/performed Arousal/Alertness: Awake/alert Behavior During Session: [facility] for tasks performed    Extremity/Trunk Assessment Right Upper Extremity Assessment RUE ROM/Strength/Tone: Within  functional levels RUE Sensation: WFL - Light Touch RUE Coordination: WFL - gross/fine motor Left Upper Extremity Assessment LUE ROM/Strength/Tone: Within functional levels LUE Sensation: WFL - Light Touch LUE Coordination: WFL - gross/fine motor  RUE noted to initially be slightly weaker than LUE, but once "warmed up" exhibited equal, if not greater, strength as LUE.  Mobility Bed Mobility Bed Mobility: Not assessed Transfers Transfers: Sit to Stand;Stand to Sit Sit to Stand: 5: Supervision;From bed Stand to Sit: 7: Independent;To bed;To toilet Details for Transfer Assistance: pt required trial x 2 for sit to stand from bed with hands on knees.            End of Session OT - End of Session Activity Tolerance: Patient tolerated treatment well Patient left: in bed;with call bell/phone within reach   Mairany Bruno 07/13/2011, 9:43 AM

## 2011-07-13 NOTE — Clinical Social Work Psychosocial (Addendum)
    Clinical Social Work Department BRIEF PSYCHOSOCIAL ASSESSMENT 07/13/2011  Patient:  Cathy Parrish, Cathy Parrish     Account Number:  [id number]     Admit date:  07/11/2011  Clinical Social Worker:  Peggyann Shoals  Date/Time:  07/13/2011 06:00 PM  Referred by:  Care Management  Date Referred:  07/13/2011 Referred for  Substance Abuse   Other Referral:   Interview type:  Patient Other interview type:    PSYCHOSOCIAL DATA Living Status:  FAMILY Admitted from facility:   Level of care:   Primary support name:  [name] Primary support relationship to patient:  PARENT Degree of support available:   Adequate    CURRENT CONCERNS Current Concerns  Substance Abuse   Other Concerns:    SOCIAL WORK ASSESSMENT / PLAN CSW met with pt to conduct assessment and SBIRT. Pt is aware that her UDS tested positive for cocaine at admission. 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 Pt shared that her family is not aware, however she plans to stop using cocaine. Pt endorsed the use of ETOH and denies the use of other substances. Pt is accepting of resources for assistance in stopping the use of cocaine.    CSW provided support to pt and encouraged pt to utilize the resouces provided while acknowledging that pt would have to make that choice to stop. Pt citied that her health and her son is her motivation for the stopping of cocaine use.  CSW is signing off as no further needs identified. Pt for discharge home.    Assessment/plan status:  No Further Intervention Required Other assessment/ plan:   Information/referral to community resources:   NA/AA resources and [employer].    PATIENT'S/FAMILY'S RESPONSE TO PLAN OF CARE: Pt was very pleasant and alert and oriented. Pt was accepting of resources and was thankful for CSW intervention.  Cathy Parrish, MSW, Theresia Parrish [phone]

## 2011-07-13 NOTE — Evaluation (Signed)
Physical Therapy Evaluation Patient Details Name: Cathy Parrish MRN: [id number] DOB: [birth year] Today's Date: 07/13/2011 Time: 4098-1191 PT Time Calculation (min): 11 min  PT Assessment / Plan / Recommendation Clinical Impression  pt adm with R side weakness, numbness and slurred speech. Pt's s/s quickly resolved, but some mild general weakness, fatigue from anemia remain.  General mobility is safe and like at or close to baseline with some mild unsteadiness that pt can self manage without safety risk.  No further PT needs    PT Assessment  Patent does not need any further PT services    Follow Up Recommendations  No PT follow up    Barriers to Discharge        lEquipment Recommendations  None recommended by PT (TBD- do not anticipate any needs)    Recommendations for Other Services     Frequency      Precautions / Restrictions Precautions Precautions: Fall (very mild risk) Restrictions Weight Bearing Restrictions: No   Pertinent Vitals/Pain       Mobility  Bed Mobility Bed Mobility: Supine to Sit;Sitting - Scoot to Edge of Bed Supine to Sit: 7: Independent Sitting - Scoot to Edge of Bed: 7: Independent Transfers Transfers: Sit to Stand;Stand to Sit Sit to Stand: 5: Supervision;From bed Stand to Sit: 7: Independent;To bed Details for Transfer Assistance:  pt steady throughout, no cues or assistance needed Ambulation/Gait Ambulation/Gait Assistance: 5: Supervision Ambulation Distance (Feet): 300 Feet Assistive device: None Ambulation/Gait Assistance Details: generally steady if a little guarded due to general weakness Gait Pattern: Within Functional Limits Gait velocity: moderate, subjectively neighborhood level Stairs: Yes Stairs Assistance: 5: Supervision Stairs Assistance Details (indicate cue type and reason): alternating with use of one rail Stair Management Technique: One rail Left;Alternating pattern;Forwards Number of Stairs: 3  Wheelchair  Mobility Wheelchair Mobility: No    Exercises     PT Diagnosis:    PT Problem List:   PT Treatment Interventions:     PT Goals    Visit Information  Last PT Received On: 07/13/11 Assistance Needed: +1    Subjective Data  Subjective: yeh I've been quite anemic Patient Stated Goal: home independent   Prior Functioning  Home Living Lives With: Son (19y/o) Available Help at Discharge: Family Type of Home: House Home Access: Stairs to enter Secretary/administrator of Steps: 5 Entrance Stairs-Rails: Can reach both Home Layout: One level Bathroom Shower/Tub: Forensic scientist: Standard Home Adaptive Equipment: None Prior Function Level of Independence: Independent Able to Take Stairs?: Reciprically Driving: Yes 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[employer] Communication: No difficulties Dominant Hand: Right    Cognition  Overall Cognitive Status: Appears within functional limits for tasks assessed/performed Arousal/Alertness: Awake/alert Orientation Level: Oriented X4 / Intact Behavior During Session: [facility] for tasks performed    Extremity/Trunk Assessment Right Upper Extremity Assessment RUE ROM/Strength/Tone: Within functional levels RUE Sensation: WFL - Light Touch RUE Coordination: WFL - gross/fine motor Left Upper Extremity Assessment LUE ROM/Strength/Tone: Within functional levels LUE Sensation: WFL - Light Touch LUE Coordination: WFL - gross/fine motor Right Lower Extremity Assessment RLE ROM/Strength/Tone: Within functional levels Left Lower Extremity Assessment LLE ROM/Strength/Tone: Within functional levels Trunk Assessment Trunk Assessment: Normal   Balance Balance Balance Assessed:  (see DGI) Standardized Balance Assessment Standardized Balance Assessment: Dynamic Gait Index Dynamic Gait Index Level Surface: Normal Change in Gait Speed: Normal Gait with Horizontal Head Turns:  Mild Impairment Gait with Vertical Head Turns: Normal Gait and Pivot Turn: Normal  Step Over Obstacle: Normal Step Around Obstacles: Mild Impairment Steps: Normal Total Score: 22   End of Session PT - End of Session Activity Tolerance: Patient tolerated treatment well Patient left: Other (comment);with call bell/phone within reach (EOB) Nurse Communication: Mobility status   Wyvonne Carda, Eliseo Gum 07/13/2011, 11:52 AM  07/13/2011  [place], PT [phone] (pager)

## 2011-07-13 NOTE — Progress Notes (Signed)
Pt had received 2nd unit of PRBC with no reaction noted and able to tolerate well.

## 2011-07-13 NOTE — Op Note (Signed)
Cathy Parrish [facility] [address] [place], [place]  [address]  COLONOSCOPY PROCEDURE REPORT  PATIENT:  Cathy Parrish, Cathy Parrish  MR#:  [id number] BIRTHDATE:  21-Mar-1970, 40 yrs. old  GENDER:  female ENDOSCOPIST:  Iva Boop, MD, [facility] REF. BY:  [facility] PROCEDURE DATE:  07/13/2011 PROCEDURE:  Colonoscopy 24401 ASA CLASS:  Class II INDICATIONS:  Iron deficiency anemia MEDICATIONS:   There was residual sedation effect present from prior procedure., Fentanyl 50 mcg IV, Versed 1 mg IV  DESCRIPTION OF PROCEDURE:   After the risks benefits and alternatives of the procedure were thoroughly explained, informed consent was obtained.  Digital rectal exam was performed and revealed no abnormalities.   The Pentax Colonoscope [id number] endoscope was introduced through the anus and advanced to the terminal ileum which was intubated for a short distance, without limitations.  The quality of the prep was excellent, using MoviPrep.  The instrument was then slowly withdrawn as the colon was fully examined. <<PROCEDUREIMAGES>>  FINDINGS:  Mild diverticulosis was found in the sigmoid colon. Otherwse normal exam including terminal ileum and right colon retroflexion.   Retroflexed views in the rectum revealed no abnormalities.  The scope was then withdrawn in 8 minutes from the cecum and the procedure completed. COMPLICATIONS:  None ENDOSCOPIC IMPRESSION: 1) Mild diverticulosis in the sigmoid colon, otherwise normal colonoscopy to terminal ileum RECOMMENDATIONS: 1) I will call EGD biopsy results and plans 2) She needs oral iron supplementation and B12 1000ug weekly x 3 more weeks and then monthly vs. oral supplementation and follow-up (through primary care MD) REPEAT EXAM:  In 10 year(s) for routine screening colonoscopy.  Iva Boop, MD, [facility]  CC:  Mirna Mires, MD and The Patient  n. eSIGNED:   Iva Boop at 07/13/2011 04:39 PM  Dorathy Daft, [phone]

## 2011-07-13 NOTE — Discharge Instructions (Signed)
Follow with Primary MD Evlyn Courier, MD, MD in 3 days   Get CBC, CMP, checked 3 days by Primary MD and again as instructed by your Primary MD.   Get Iron and B12 levels checked in 4 weeks  Get Medicines reviewed and adjusted.  Please request your Prim.MD to go over all Hospital Tests and Procedure/Radiological results at the follow up, please get all Hospital records sent to your Prim MD by signing hospital release before you go home.  Activity: As tolerated with Full fall precautions use walker/cane & assistance as needed  Diet: Heart Healthy, Aspiration precautions.  For Heart failure patients - Check your Weight same time everyday, if you gain over 2 pounds, or you develop in leg swelling, experience more shortness of breath or chest pain, call your Primary MD immediately. Follow Cardiac Low Salt Diet and 1.8 lit/day fluid restriction.  Disposition Home  If you experience worsening of your admission symptoms, develop shortness of breath, life threatening emergency, suicidal or homicidal thoughts you must seek medical attention immediately by calling 911 or calling your MD immediately  if symptoms less severe.  You Must read complete instructions/literature along with all the possible adverse reactions/side effects for all the Medicines you take and that have been prescribed to you. Take any new Medicines after you have completely understood and accpet all the possible adverse reactions/side effects.   Do not drive if your were admitted for syncope or siezures until you have seen by Primary MD or a Neurologist and advised to drive.  Do not drive when taking Pain medications.    Do not take more than prescribed Pain, Sleep and Anxiety Medications  Special Instructions: If you have smoked or chewed Tobacco  in the last 2 yrs please stop smoking, stop any regular Alcohol  and or any Recreational drug use.  Wear Seat belts while driving.

## 2011-07-13 NOTE — Progress Notes (Signed)
[facility]                                                                                 Patient Demographics  Cathy Parrish, is a 41 y.o. female  ZOX:[id number]  [id number]  DOB - 02-16-1971  Admit date - 07/11/2011  Admitting Physician Leroy Sea, MD  Outpatient Primary MD for the patient is Evlyn Courier, MD, MD  LOS - 2     Chief Complaint  Patient presents with  . Code Stroke        Subjective:   Cathy Parrish today has, No headache, No chest pain, No abdominal pain - No Nausea, No new weakness tingling or numbness, No Cough - SOB.    Objective:   Filed Vitals:   07/13/11 0045 07/13/11 0115 07/13/11 0500 07/13/11 0600  BP: 120/73 127/85  130/83  Pulse: 71 73  70  Temp: 98.6 F (37 C) 98.3 F (36.8 C)  98.3 F (36.8 C)  TempSrc: Oral Oral  Oral  Resp: 20 20  20   Height:      Weight:   96.8 kg (213 lb 6.5 oz)   SpO2:    97%    Wt Readings from Last 3 Encounters:  07/13/11 96.8 kg (213 lb 6.5 oz)  07/13/11 96.8 kg (213 lb 6.5 oz)  07/13/11 96.8 kg (213 lb 6.5 oz)     Intake/Output Summary (Last 24 hours) at 07/13/11 0930 Last data filed at 07/12/11 2240  Gross per 24 hour  Intake   2868 ml  Output      0 ml  Net   2868 ml    Exam Awake Alert, Oriented *3, No new F.N deficits, Normal affect [place].AT,PERRAL Supple Neck,No JVD, No cervical lymphadenopathy appriciated.  Symmetrical Chest wall movement, Good air movement bilaterally, CTAB RRR,No Gallops,Rubs or new Murmurs, No Parasternal Heave +ve B.Sounds, Abd Soft, Non tender, No organomegaly appriciated, No rebound -guarding or rigidity. No Cyanosis, Clubbing or edema, No new Rash or bruise    Data Review  CBC  Lab 07/13/11 0604 07/12/11 0710 07/11/11 1705 07/11/11 1652  WBC -- 8.6 -- 11.2*  HGB 8.8* 6.4* 7.5* 5.7*  HCT 28.1* 22.3* 22.0* 21.0*  PLT -- 230 -- 279  MCV -- 66.8* -- 63.4*  MCH -- 19.2* -- 17.2*  MCHC -- 28.7* -- 27.1*  RDW -- 22.4* --  21.4*  LYMPHSABS -- -- -- 2.6  MONOABS -- -- -- 0.9  EOSABS -- -- -- 0.7  BASOSABS -- -- -- 0.0  BANDABS -- -- -- --    Chemistries   Lab 07/12/11 0710 07/11/11 1705 07/11/11 1652  NA 136 137 135  K 3.3* 3.4* 3.6  CL 102 104 101  CO2 24 -- 21  GLUCOSE 101* 126* 121*  BUN 10 12 12   CREATININE 0.70 0.70 0.75  CALCIUM 9.0 -- 9.3  MG -- -- --  AST -- -- 16  ALT -- -- 7  ALKPHOS -- -- 75  BILITOT -- -- 0.3   ------------------------------------------------------------------------------------------------------------------ estimated creatinine clearance is 107.5 ml/min (by C-G formula based on Cr of 0.7). ------------------------------------------------------------------------------------------------------------------  Basename 07/12/11 0710  HGBA1C 6.2*   ------------------------------------------------------------------------------------------------------------------  Basename 07/12/11 0710  CHOL 144  HDL 44  LDLCALC 78  TRIG 109  CHOLHDL 3.3  LDLDIRECT --   ------------------------------------------------------------------------------------------------------------------ No results found for this basename: TSH,T4TOTAL,FREET3,T3FREE,THYROIDAB in the last 72 hours ------------------------------------------------------------------------------------------------------------------  [facility] 07/11/11 1842  VITAMINB12 135*  FOLATE 13.2  FERRITIN 3*  TIBC 581*  IRON 13*  RETICCTPCT 1.6    Coagulation profile  Lab 07/12/11 0924 07/12/11 0710 07/11/11 1652  INR 0.99 1.03 1.01  PROTIME -- -- --     Basename 07/12/11 0924  DDIMER 0.67*    Cardiac Enzymes  Lab 07/11/11 1653  CKMB 1.4  TROPONINI <0.30  MYOGLOBIN --   ------------------------------------------------------------------------------------------------------------------ No components found with this basename: POCBNP:3  Micro Results No results found for this or any previous visit (from the past 240  hour(s)).  Radiology Reports Ct Head Wo Contrast  07/11/2011  *RADIOLOGY REPORT*  Clinical Data: Right-sided weakness, acute onset  CT HEAD WITHOUT CONTRAST  Technique:  Contiguous axial images were obtained from the base of the skull through the vertex without contrast.  Comparison: None.  Findings: No acute intracranial hemorrhage.  No focal mass lesion. No CT evidence of acute infarction.   No midline shift or mass effect.  No hydrocephalus.  Basilar cisterns are patent. Paranasal sinuses and mastoid air cells are clear.  Orbits are normal.  IMPRESSION: No acute intracranial findings. No intracranial hemorrhage or CT evidence of infarction. Essentially normal head CT  Findings conveyed to Dr. [place] Fleeting 07/11/2011 at to 1655 hours  Original Report Authenticated By: Genevive Bi, M.D.    Scheduled Meds:    . sodium chloride   Intravenous Once  . sodium chloride   Intravenous Once  . aspirin EC  325 mg Oral Daily  . bisacodyl  20 mg Oral Once  . chlordiazePOXIDE  10 mg Oral TID  . cyanocobalamin  1,000 mcg Intramuscular Daily  . famotidine  20 mg Oral BID  . folic acid  1 mg Oral Daily  . iron dextran (INFED/DEXFERRUM) infusion  25 mg Intravenous Once   Followed by  . iron dextran (INFED/DEXFERRUM) infusion  1,250 mg Intravenous Once  . magnesium citrate  0.5 Bottle Oral Once  . metoCLOPramide (REGLAN) injection  10 mg Intravenous Once  . metoCLOPramide (REGLAN) injection  10 mg Intravenous Once  . mulitivitamin with minerals  1 tablet Oral Daily  . polyethylene glycol-electrolytes  2,000 mL Oral Once  . polyethylene glycol-electrolytes  2,000 mL Oral Once  . potassium chloride  40 mEq Oral Once  . sodium chloride  3 mL Intravenous Q12H  . thiamine  100 mg Oral Daily  . DISCONTD: cyanocobalamin  1,000 mcg Intramuscular Once  . DISCONTD: thiamine  100 mg Intravenous Daily   Continuous Infusions:  PRN Meds:.albuterol, guaiFENesin-dextromethorphan, HYDROcodone-acetaminophen, LORazepam,  LORazepam, ondansetron (ZOFRAN) IV, ondansetron, senna-docusate  Assessment & Plan   41 year old [place] American female in reasonably good health except hypertension, alcohol abuse and some cocaine abuse intermittently, presented for right sided weakness physical exam was inconsistent with any organic neurologic lesion, symptoms were likely due to being under emotional distress and psychological stress, he shouldn't was not suicidal homicidal and did not want psych input. He was cleared from neurological standpoint, ever during her routine blood work she was found to be profoundly anemic with iron and low B12, is given 3 units of packed RBC transfusion IV iron and intramuscular B12 replacement, he has been seen by gastroenterology and is due for EGD and colonoscopy this evening for evaluation of  possible malabsorption question celiac disease versus inflammatory colitis.    1. Right-sided weakness which was likely related to stress/somatoform in origin- head CT unremarkable, symptoms completely resolved, this does not appear to be a neurological event, discussed with Dr. Pearlean Brownie, for now carotid duplex is stable, echo pending, will complete MRI MRA of the brain, stable A1c and lipid panel, will stop aspirin. Again her symptoms have completely resolved.  Lab Results  Component Value Date   HGBA1C 6.2* 07/12/2011    Lab Results  Component Value Date   CHOL 144 07/12/2011   HDL 44 07/12/2011   LDLCALC 78 07/12/2011   TRIG 109 07/12/2011   CHOLHDL 3.3 07/12/2011      2. Severe iron deficiency anemia with low B12- likely from heavy menstrual periods, question malabsorption syndromes like celiac disease, inflammatory colitis, occult negative x1 will repeat, LDH normal, fibrinogen normal, peripheral smear discussed with hematologist Dr. Darrold Span suggest IV iron replacement, intramuscular B12 and 2 units of blood transfusion aching total 3 units since admission and no further workup at this time, if anemia  persists she would like to see the patient in the office will request PCP to monitor.   GI seeing the patient will undergo EGD and colonoscopy later this evening she is undergoing bowel prep right now, will likely need outpatient GI followup.    3. Hypertension no acute issues outpatient followup with PCP.     4. History of alcohol and cocaine abuse patient counseled continue CIWA protocol and Librium. No evidence of withdrawal.    DVT Prophylaxis SCDs      Leroy Sea M.D on 07/13/2011 at 9:30 AM  [facility] Office  [phone]

## 2011-07-13 NOTE — Discharge Summary (Addendum)
[facility]                                                                                   Cathy Parrish, 41 y.o., is a 41 y.o. female  DOB 1970-09-25  MRN [id number].  Admission date:  07/11/2011  Discharge Date:  07/13/2011  Primary MD  Evlyn Courier, MD, MD  Admitting Physician  Leroy Sea, MD  Admission Diagnosis  Transient ischemic attack (TIA) [435.9] Anemia [285.9] stroke like symptoms iron-deficiency anemia iron deficiency anemia, evaluation of abdominal pain  Discharge Diagnosis     Principal Problem:  *Weakness Active Problems:  Hypertension  Iron deficiency anemia  B12 deficiency  NSAID long-term use-[facility]   Past Medical History  Diagnosis Date  . Hypertension     Past Surgical History  Procedure Date  . Dilation and curettage of uterus abortion     Hospital Course See H&P, Labs, Consult and Test reports for all details in brief, patient was admitted for   41 year old African American female in reasonably good health except hypertension, alcohol abuse and some cocaine abuse intermittently, presented for right sided weakness physical exam was inconsistent with any organic neurologic lesion, symptoms were likely due to being under emotional distress and psychological stress, he shouldn't was not suicidal homicidal and did not want psych input. He was cleared from neurological standpoint, ever during her routine blood work she was found to be profoundly anemic with iron and low B12, is given 3 units of packed RBC transfusion IV iron and intramuscular B12 replacement, she was seen by gastroenterology EGD and colonoscopy for evaluation of possible malabsorption question celiac disease versus inflammatory colitis. However both were inconclusive and essentially unremarkable she will continue to follow with GI outpatient, patient will be placed on oral iron and IM B12 supplementation.   1. Right-sided  weakness which was likely related to stress/somatoform in origin- head CT unremarkable, symptoms completely resolved, this does not appear to be a neurological event, discussed with Dr. Pearlean Brownie, for now carotid duplex is stable, echo pending, will complete MRI MRA of the brain, stable A1c and lipid panel, will stop aspirin. Again her symptoms have completely resolved. She herself admitted that these were non-organic in origin i.e. stress and self-induced.   Lab Results   Component  Value  Date    HGBA1C  6.2*  07/12/2011     Lab Results   Component  Value  Date    CHOL  144  07/12/2011    HDL  44  07/12/2011    LDLCALC  78  07/12/2011    TRIG  109  07/12/2011    CHOLHDL  3.3  07/12/2011      2. Severe iron deficiency anemia with low B12- likely from heavy menstrual periods, question malabsorption syndromes like celiac disease, inflammatory colitis, occult negative , LDH normal, fibrinogen normal, peripheral smear discussed with hematologist Dr. Darrold Span which was adjusted of severe iron deficiency , as suggested by GI and hematology patient received t IV iron replacement, intramuscular B12 and  total 3 units since admission and no further workup at this time, she will be discharged on oral iron folic acid and  B12 IM, will require primary care physician to please followup patient's anemia, iron levels and B12 levels outpatient she will also follow with GI. Her EGD and colonoscopy were unremarkable. If questions persist regarding anemia outpatient followup with hematology can be considered.    3. Hypertension no acute issues outpatient followup with PCP.     4. History of alcohol and cocaine abuse patient counseled , was stable on CIWA protocol and Librium.- Has no evidence of withdrawal. Patient declined any formal intervention or help. We'll discharge home on folic acid and thiamine supplementation.      Consults  GI  Significant Tests:  See full reports for all details     Colonoscopy ENDOSCOPIC IMPRESSION:  1) Mild diverticulosis in the sigmoid colon, otherwise normal  colonoscopy to terminal ileum  RECOMMENDATIONS:  1) I will call EGD biopsy results and plans  2) She needs oral iron supplementation and B12 1000ug weekly x 3  more weeks and then monthly vs. oral supplementation and follow-up  (through primary care MD)  REPEAT EXAM: In 10 year(s) for routine screening colonoscopy.    EGD ENDOSCOPIC IMPRESSION:  1) Nodules in the antrum- biopsied  2) Otherwise normal examination  RECOMMENDATIONS:  1) Await biopsy results  2) Proceed with a Colonscopy.   Echo Study Conclusions  Left ventricle: The cavity size was normal. Wall thickness was normal. Systolic function was normal. The estimated ejection fraction was in the range of 55% to 60%. Left ventricular diastolic function parameters were normal.  Impressions:  - No cardiac source of emboli was indentified.    Carotids Carotid duplex completed.  Preliminary report: Bilateral: No evidence of hemodynamically significant internal carotid artery stenosis. Vertebral artery flow is antegrade.     Ct Head Wo Contrast  07/11/2011  *RADIOLOGY REPORT*  Clinical Data: Right-sided weakness, acute onset  CT HEAD WITHOUT CONTRAST  Technique:  Contiguous axial images were obtained from the base of the skull through the vertex without contrast.  Comparison: None.  Findings: No acute intracranial hemorrhage.  No focal mass lesion. No CT evidence of acute infarction.   No midline shift or mass effect.  No hydrocephalus.  Basilar cisterns are patent. Paranasal sinuses and mastoid air cells are clear.  Orbits are normal.  IMPRESSION: No acute intracranial findings. No intracranial hemorrhage or CT evidence of infarction. Essentially normal head CT  Findings conveyed to Dr. [place] Fleeting 07/11/2011 at to 1655 hours  Original Report Authenticated By: Genevive Bi, M.D.   Mr Brain Wo Contrast  07/13/2011   *RADIOLOGY REPORT*  Clinical Data:  Stroke.  Chronic hypertension.  Episode of right arm and hand contractions.  Episode of right-sided arm and leg pain and weakness.  Slurred speech.  The symptoms have resolved.  MRI HEAD WITHOUT CONTRAST MRA HEAD WITHOUT CONTRAST  Technique:  Multiplanar, multiecho pulse sequences of the brain and surrounding structures were obtained without intravenous contrast. Angiographic images of the head were obtained using MRA technique without contrast.  Comparison:  CT head with contrast 07/11/2011.  MRI HEAD  Findings:  No acute infarct, hemorrhage, or mass lesion is present. Flow is present in the major intracranial arteries.  The ventricles are of normal size.  No significant extra-axial fluid collections are present.  Mild mucosal thickening is present within the maxillary sinuses, worse on the left.  There is some mucosal thickening within the anterior left ethmoid air cells and inferior left frontal sinus.  The mastoid air cells are clear.  T1 marrow signal in the upper  cervical spine is low.  IMPRESSION:  1.  Normal MRI appearance of the brain. 2.  Minimal sinus disease. 3.  Decreased T1 marrow signal is compatible with the patient's anemia.  MRA HEAD  Findings: The internal carotid arteries are within normal limits from high cervical segments through the ICA termini.  The A1 and M1 segments are normal.  A right posterior communicating artery is present.  There is a small infundibulum associated with the left posterior communicating artery.  The study is mildly degraded by motion.  The ACA and MCA branch vessels are within normal limits. The anterior communicating artery is patent.  The vertebral arteries are codominant.  The right PICA origin is visualized and normal.  The left AICA is dominant.  A prominent right the AICA is noted as well.  Both posterior cerebral arteries originate from the basilar tip.  The PCA branch vessels are normal.  IMPRESSION: Normal variant MRA circle  of Willis without evidence for significant proximal stenosis, aneurysm, or branch vessel occlusion.  Original Report Authenticated By: Jamesetta Orleans. MATTERN, M.D.   Mr Maxine Glenn Head/brain Wo Cm  07/13/2011  *RADIOLOGY REPORT*  Clinical Data:  Stroke.  Chronic hypertension.  Episode of right arm and hand contractions.  Episode of right-sided arm and leg pain and weakness.  Slurred speech.  The symptoms have resolved.  MRI HEAD WITHOUT CONTRAST MRA HEAD WITHOUT CONTRAST  Technique:  Multiplanar, multiecho pulse sequences of the brain and surrounding structures were obtained without intravenous contrast. Angiographic images of the head were obtained using MRA technique without contrast.  Comparison:  CT head with contrast 07/11/2011.  MRI HEAD  Findings:  No acute infarct, hemorrhage, or mass lesion is present. Flow is present in the major intracranial arteries.  The ventricles are of normal size.  No significant extra-axial fluid collections are present.  Mild mucosal thickening is present within the maxillary sinuses, worse on the left.  There is some mucosal thickening within the anterior left ethmoid air cells and inferior left frontal sinus.  The mastoid air cells are clear.  T1 marrow signal in the upper cervical spine is low.  IMPRESSION:  1.  Normal MRI appearance of the brain. 2.  Minimal sinus disease. 3.  Decreased T1 marrow signal is compatible with the patient's anemia.  MRA HEAD  Findings: The internal carotid arteries are within normal limits from high cervical segments through the ICA termini.  The A1 and M1 segments are normal.  A right posterior communicating artery is present.  There is a small infundibulum associated with the left posterior communicating artery.  The study is mildly degraded by motion.  The ACA and MCA branch vessels are within normal limits. The anterior communicating artery is patent.  The vertebral arteries are codominant.  The right PICA origin is visualized and normal.  The left  AICA is dominant.  A prominent right the AICA is noted as well.  Both posterior cerebral arteries originate from the basilar tip.  The PCA branch vessels are normal.  IMPRESSION: Normal variant MRA circle of Willis without evidence for significant proximal stenosis, aneurysm, or branch vessel occlusion.  Original Report Authenticated By: Jamesetta Orleans. MATTERN, M.D.     Today   Subjective:   Cathy Parrish today has no headache,no chest abdominal pain,no new weakness tingling or numbness, feels much better wants to go home today.    Objective:   Blood pressure 143/73, pulse 72, temperature 97.3 F (36.3 C), temperature source Oral, resp. rate 17, height  5\' 6"  (1.676 m), weight 96.8 kg (213 lb 6.5 oz), last menstrual period 06/30/2011, SpO2 98.00%.  Intake/Output Summary (Last 24 hours) at 07/13/11 1651 Last data filed at 07/12/11 2240  Gross per 24 hour  Intake   1728 ml  Output      0 ml  Net   1728 ml    Exam Awake Alert, Oriented *3, No new F.N deficits, Normal affect [place].AT,PERRAL Supple Neck,No JVD, No cervical lymphadenopathy appriciated.  Symmetrical Chest wall movement, Good air movement bilaterally, CTAB RRR,No Gallops,Rubs or new Murmurs, No Parasternal Heave +ve B.Sounds, Abd Soft, Non tender, No organomegaly appriciated, No rebound -guarding or rigidity. No Cyanosis, Clubbing or edema, No new Rash or bruise  Data Review      CBC w Diff: Lab Results  Component Value Date   WBC 8.6 07/12/2011   HGB 8.8* 07/13/2011   HCT 28.1* 07/13/2011   PLT 230 07/12/2011   LYMPHOPCT 23 07/11/2011   MONOPCT 8 07/11/2011   EOSPCT 6* 07/11/2011   BASOPCT 0 07/11/2011   CMP: Lab Results  Component Value Date   NA 136 07/12/2011   K 3.8 07/13/2011   CL 102 07/12/2011   CO2 24 07/12/2011   BUN 10 07/12/2011   CREATININE 0.70 07/12/2011   PROT 8.0 07/11/2011   ALBUMIN 3.8 07/11/2011   BILITOT 0.3 07/11/2011   ALKPHOS 75 07/11/2011   AST 16 07/11/2011   ALT 7 07/11/2011  .  Micro  Results No results found for this or any previous visit (from the past 240 hour(s)).   Discharge Instructions     Follow with Primary MD Evlyn Courier, MD, MD in 3 days   Get CBC, CMP, checked 3 days by Primary MD and again as instructed by your Primary MD.   Get Iron and B12 levels checked in 4 weeks  Get Medicines reviewed and adjusted.  Please request your Prim.MD to go over all Hospital Tests and Procedure/Radiological results at the follow up, please get all Hospital records sent to your Prim MD by signing hospital release before you go home.  Activity: As tolerated with Full fall precautions use walker/cane & assistance as needed  Diet: Heart Healthy, Aspiration precautions.  For Heart failure patients - Check your Weight same time everyday, if you gain over 2 pounds, or you develop in leg swelling, experience more shortness of breath or chest pain, call your Primary MD immediately. Follow Cardiac Low Salt Diet and 1.8 lit/day fluid restriction.  Disposition Home  If you experience worsening of your admission symptoms, develop shortness of breath, life threatening emergency, suicidal or homicidal thoughts you must seek medical attention immediately by calling 911 or calling your MD immediately  if symptoms less severe.  You Must read complete instructions/literature along with all the possible adverse reactions/side effects for all the Medicines you take and that have been prescribed to you. Take any new Medicines after you have completely understood and accpet all the possible adverse reactions/side effects.   Do not drive if your were admitted for syncope or siezures until you have seen by Primary MD or a Neurologist and advised to drive.  Do not drive when taking Pain medications.    Do not take more than prescribed Pain, Sleep and Anxiety Medications  Special Instructions: If you have smoked or chewed Tobacco  in the last 2 yrs please stop smoking, stop any regular  Alcohol  and or any Recreational drug use.  Wear Seat belts while driving.  Follow-up  Information    Follow up with HILL,GERALD K, MD. Schedule an appointment as soon as possible for a visit in 1 week.   Contact information:   [address] [place] [address] [phone]       Follow up with Stan Head, MD. Schedule an appointment as soon as possible for a visit in 1 week.   Contact information:   [address] [facility] [address] [place] [address] [place] [address] [phone]          Discharge Medications   Medication List  As of 07/13/2011  4:51 PM   START taking these medications         cyanocobalamin 1000 MCG/ML injection   Commonly known as: (VITAMIN B-12)   Inject 1 mL (1,000 mcg total) into the muscle daily.      ferrous sulfate 325 (65 FE) MG tablet   Take 1 tablet (325 mg total) by mouth 2 (two) times daily with a meal.      folic acid 1 MG tablet   Commonly known as: FOLVITE   Take 1 tablet (1 mg total) by mouth daily.      thiamine 100 MG tablet   Take 1 tablet (100 mg total) by mouth daily.         CONTINUE taking these medications         BENTYL 10 MG capsule   Generic drug: dicyclomine      hydrochlorothiazide 25 MG tablet   Commonly known as: HYDRODIURIL         STOP taking these medications         famotidine 20 MG tablet          Where to get your medications    These are the prescriptions that you need to pick up. We sent them to a specific pharmacy, so you will need to go there to get them.   [facility] [place], [place] - [address]    [address] [place]  [address]    Phone: [phone]        ferrous sulfate 325 (65 FE) MG tablet   folic acid 1 MG tablet   thiamine 100 MG tablet         You may get these medications from any pharmacy.         cyanocobalamin 1000 MCG/ML injection             Total Time in preparing paper work, data  evaluation and todays exam - 35 minutes  Leroy Sea M.D on 07/13/2011 at 4:51 PM  [facility] Office  [phone]

## 2011-07-14 ENCOUNTER — Encounter (HOSPITAL_COMMUNITY): Payer: Self-pay | Admitting: Internal Medicine

## 2011-07-14 NOTE — Care Management Note (Signed)
    Page 1 of 1   07/14/2011     3:09:13 PM   CARE MANAGEMENT NOTE 07/14/2011  Patient:  Cathy Parrish, Cathy Parrish   Account Number:  [id number]  Date Initiated:  07/12/2011  Documentation initiated by:  [facility]  Subjective/Objective Assessment:   Admitted with rt sided weakness, slurred speech, Hgb 6.4.     Action/Plan:   PT eval-no d/c needs  OT eval-no d/c needs  CSW referral for substance abuse   Anticipated DC Date:  07/14/2011   Anticipated DC Plan:  HOME/SELF CARE  In-house referral  Clinical Social Worker      DC Planning Services  CM consult      Choice offered to / List presented to:             Status of service:  Completed, signed off Medicare Important Message given?   (If response is "NO", the following Medicare IM given date fields will be blank) Date Medicare IM given:   Date Additional Medicare IM given:    Discharge Disposition:  HOME/SELF CARE  Per [facility] Regulation:  Reviewed for med. necessity/level of care/duration of stay  If discussed at Long Length of Stay Meetings, dates discussed:    Comments:  PCP Dr. Mirna Mires  07/12/11 PT and OT evals delayed due to low Hgb, receiving 2 units PRBCs. Will follow for d/c needs. Jacquelynn Cree RN, BSN, CCM

## 2011-07-15 ENCOUNTER — Telehealth: Payer: Self-pay | Admitting: Internal Medicine

## 2011-07-15 NOTE — Telephone Encounter (Signed)
Patient advised.  She is advised we will notify her of the results of her pathology.  She is aware to make a follow up with her primary care MD

## 2011-07-15 NOTE — Telephone Encounter (Signed)
Per endo reports Iron Bid and follow up with her primary care.  She has been advised per discharge instructions to follow up with you in 1 week.  Please advise

## 2011-07-15 NOTE — Telephone Encounter (Signed)
I did not think she needed GI follow-up - did not have a GI source of anemia Please explain that to her Needs PCP follow-up

## 2011-07-15 NOTE — Telephone Encounter (Signed)
She does have pathology pending that I will notify her if anything needs to be done

## 2011-07-15 NOTE — Progress Notes (Signed)
Quick Note:  Let her know biopsies showed gastritis - from NSAID use She needs to minimize use of those and if taking daily then take Prilosec OTC or Prevacid OTC daily  No GI follow-up needed See PCP for anemia follow-up and B12 Tx   Please forward path and this note to pcp ______

## 2011-07-19 ENCOUNTER — Ambulatory Visit: Payer: BC Managed Care – PPO

## 2011-08-26 ENCOUNTER — Ambulatory Visit
Admission: RE | Admit: 2011-08-26 | Discharge: 2011-08-26 | Disposition: A | Discharge status: Home / Self Care | Payer: BC Managed Care – PPO | Source: Ambulatory Visit | Attending: Family Medicine | Admitting: Family Medicine

## 2011-08-26 DIAGNOSIS — Z1231 Encounter for screening mammogram for malignant neoplasm of breast: Secondary | ICD-10-CM

## 2011-08-31 ENCOUNTER — Other Ambulatory Visit: Payer: Self-pay | Admitting: Family Medicine

## 2011-08-31 DIAGNOSIS — R928 Other abnormal and inconclusive findings on diagnostic imaging of breast: Secondary | ICD-10-CM

## 2011-09-08 ENCOUNTER — Ambulatory Visit
Admission: RE | Admit: 2011-09-08 | Discharge: 2011-09-08 | Disposition: A | Discharge status: Home / Self Care | Payer: BC Managed Care – PPO | Source: Ambulatory Visit | Attending: Family Medicine | Admitting: Family Medicine

## 2011-09-08 ENCOUNTER — Other Ambulatory Visit: Payer: Self-pay | Admitting: Family Medicine

## 2011-09-08 DIAGNOSIS — R921 Mammographic calcification found on diagnostic imaging of breast: Secondary | ICD-10-CM

## 2011-09-08 DIAGNOSIS — R928 Other abnormal and inconclusive findings on diagnostic imaging of breast: Secondary | ICD-10-CM

## 2011-09-20 ENCOUNTER — Ambulatory Visit
Admission: RE | Admit: 2011-09-20 | Discharge: 2011-09-20 | Disposition: A | Discharge status: Home / Self Care | Payer: BC Managed Care – PPO | Source: Ambulatory Visit | Attending: Family Medicine | Admitting: Family Medicine

## 2011-09-20 ENCOUNTER — Other Ambulatory Visit: Payer: Self-pay | Admitting: Family Medicine

## 2011-09-20 DIAGNOSIS — R921 Mammographic calcification found on diagnostic imaging of breast: Secondary | ICD-10-CM

## 2011-10-25 ENCOUNTER — Other Ambulatory Visit (HOSPITAL_COMMUNITY)
Admission: RE | Admit: 2011-10-25 | Discharge: 2011-10-25 | Disposition: A | Discharge status: Home / Self Care | Payer: BC Managed Care – PPO | Source: Ambulatory Visit | Attending: Family Medicine | Admitting: Family Medicine

## 2011-10-25 ENCOUNTER — Other Ambulatory Visit: Payer: Self-pay | Admitting: Family Medicine

## 2011-10-25 DIAGNOSIS — R87810 Cervical high risk human papillomavirus (HPV) DNA test positive: Secondary | ICD-10-CM | POA: Insufficient documentation

## 2011-10-25 DIAGNOSIS — Z01419 Encounter for gynecological examination (general) (routine) without abnormal findings: Secondary | ICD-10-CM | POA: Insufficient documentation

## 2012-08-16 ENCOUNTER — Other Ambulatory Visit: Payer: Self-pay

## 2012-08-16 DIAGNOSIS — Z1231 Encounter for screening mammogram for malignant neoplasm of breast: Secondary | ICD-10-CM

## 2012-08-28 ENCOUNTER — Ambulatory Visit: Payer: BC Managed Care – PPO

## 2012-09-11 ENCOUNTER — Other Ambulatory Visit: Payer: Self-pay | Admitting: Obstetrics and Gynecology

## 2012-09-15 ENCOUNTER — Encounter (HOSPITAL_COMMUNITY): Payer: Self-pay | Admitting: Pharmacist

## 2012-09-26 ENCOUNTER — Other Ambulatory Visit: Payer: Self-pay

## 2012-09-26 ENCOUNTER — Encounter (HOSPITAL_COMMUNITY)
Admission: RE | Admit: 2012-09-26 | Discharge: 2012-09-26 | Disposition: A | Discharge status: Home / Self Care | Payer: BC Managed Care – PPO | Source: Ambulatory Visit | Attending: Obstetrics and Gynecology | Admitting: Obstetrics and Gynecology

## 2012-09-26 ENCOUNTER — Encounter (HOSPITAL_COMMUNITY): Payer: Self-pay

## 2012-09-26 HISTORY — DX: Depression, unspecified: F32.A

## 2012-09-26 HISTORY — DX: Major depressive disorder, single episode, unspecified: F32.9

## 2012-09-26 LAB — CBC
HCT: 31.6 % — ABNORMAL LOW (ref 36.0–46.0)
Hemoglobin: 9.2 g/dL — ABNORMAL LOW (ref 12.0–15.0)
MCH: 21.9 pg — ABNORMAL LOW (ref 26.0–34.0)
RBC: 4.21 MIL/uL (ref 3.87–5.11)

## 2012-09-26 NOTE — Patient Instructions (Addendum)
20 EMELIN DASCENZO  09/26/2012   Your procedure is scheduled on:  09/28/12  Enter through the Main Entrance of [facility] at 9 AM.  Pick up the phone at the desk and dial [phone].   Call this number if you have problems the morning of surgery: [phone]   Remember:   Do not eat food:After Midnight.  Do not drink clear liquids: After Midnight.  Take these medicines the morning of surgery with A SIP OF WATER: take blood pressure medication   Do not wear jewelry, make-up or nail polish.  Do not wear lotions, powders, or perfumes. You may wear deodorant.  Do not shave 48 hours prior to surgery.  Do not bring valuables to the hospital.  [facility] is not responsible                  for any belongings or valuables brought to the hospital.  Contacts, dentures or bridgework may not be worn into surgery.  Leave suitcase in the car. After surgery it may be brought to your room.  For patients admitted to the hospital, checkout time is 11:00 AM the day of                discharge.   Patients discharged the day of surgery will not be allowed to drive                   home.  Name and phone number of your driver: son  Joaquin Music  Special Instructions: Shower using CHG 2 nights before surgery and the night before surgery.  If you shower the day of surgery use CHG.  Use special wash - you have one bottle of CHG for all showers.  You should use approximately 1/3 of the bottle for each shower.   Please read over the following fact sheets that you were given: Surgical Site Infection Prevention

## 2012-09-28 ENCOUNTER — Encounter (HOSPITAL_COMMUNITY): Payer: Self-pay | Admitting: Anesthesiology

## 2012-09-28 ENCOUNTER — Ambulatory Visit (HOSPITAL_COMMUNITY)
Admission: RE | Admit: 2012-09-28 | Discharge: 2012-09-28 | Disposition: A | Discharge status: Home / Self Care | Payer: BC Managed Care – PPO | Source: Ambulatory Visit | Attending: Obstetrics and Gynecology | Admitting: Obstetrics and Gynecology

## 2012-09-28 ENCOUNTER — Encounter (HOSPITAL_COMMUNITY)
Admission: RE | Disposition: A | Discharge status: Home / Self Care | Payer: Self-pay | Source: Ambulatory Visit | Attending: Obstetrics and Gynecology

## 2012-09-28 ENCOUNTER — Ambulatory Visit (HOSPITAL_COMMUNITY): Payer: BC Managed Care – PPO | Admitting: Anesthesiology

## 2012-09-28 DIAGNOSIS — N840 Polyp of corpus uteri: Secondary | ICD-10-CM | POA: Insufficient documentation

## 2012-09-28 DIAGNOSIS — Z9889 Other specified postprocedural states: Secondary | ICD-10-CM

## 2012-09-28 DIAGNOSIS — N949 Unspecified condition associated with female genital organs and menstrual cycle: Secondary | ICD-10-CM | POA: Insufficient documentation

## 2012-09-28 DIAGNOSIS — N938 Other specified abnormal uterine and vaginal bleeding: Secondary | ICD-10-CM | POA: Insufficient documentation

## 2012-09-28 HISTORY — PX: DILITATION & CURRETTAGE/HYSTROSCOPY WITH NOVASURE ABLATION: SHX5568

## 2012-09-28 SURGERY — DILATATION & CURETTAGE/HYSTEROSCOPY WITH NOVASURE ABLATION
Anesthesia: General | Site: Vagina | Wound class: Clean Contaminated

## 2012-09-28 MED ORDER — KETOROLAC TROMETHAMINE 30 MG/ML IJ SOLN
INTRAMUSCULAR | Status: DC | PRN
  Administered 2012-09-28: 30 mg via INTRAVENOUS

## 2012-09-28 MED ORDER — DEXAMETHASONE SODIUM PHOSPHATE 4 MG/ML IJ SOLN
INTRAMUSCULAR | Status: DC | PRN
  Administered 2012-09-28: 10 mg via INTRAVENOUS

## 2012-09-28 MED ORDER — KETOROLAC TROMETHAMINE 60 MG/2ML IM SOLN
INTRAMUSCULAR | Status: DC | PRN
  Administered 2012-09-28: 30 mg via INTRAMUSCULAR

## 2012-09-28 MED ORDER — CHLOROPROCAINE HCL 1 % IJ SOLN
INTRAMUSCULAR | Status: AC
  Filled 2012-09-28: qty 30

## 2012-09-28 MED ORDER — MEPERIDINE HCL 25 MG/ML IJ SOLN: 6.2500 mg | INTRAMUSCULAR | Status: DC | PRN

## 2012-09-28 MED ORDER — LACTATED RINGERS IV SOLN
INTRAVENOUS | Status: DC
  Administered 2012-09-28: 10:00:00 via INTRAVENOUS

## 2012-09-28 MED ORDER — KETOROLAC TROMETHAMINE 30 MG/ML IJ SOLN
INTRAMUSCULAR | Status: AC
  Filled 2012-09-28: qty 2

## 2012-09-28 MED ORDER — FENTANYL CITRATE 0.05 MG/ML IJ SOLN
INTRAMUSCULAR | Status: DC | PRN
  Administered 2012-09-28 (×2): 100 ug via INTRAVENOUS

## 2012-09-28 MED ORDER — HYDROCODONE-ACETAMINOPHEN 5-300 MG PO TABS: 1.0000 | ORAL_TABLET | Freq: Four times a day (QID) | ORAL | Status: DC | PRN

## 2012-09-28 MED ORDER — LIDOCAINE HCL (CARDIAC) 20 MG/ML IV SOLN
INTRAVENOUS | Status: DC | PRN
  Administered 2012-09-28: 80 mg via INTRAVENOUS

## 2012-09-28 MED ORDER — DEXAMETHASONE SODIUM PHOSPHATE 10 MG/ML IJ SOLN
INTRAMUSCULAR | Status: AC
  Filled 2012-09-28: qty 1

## 2012-09-28 MED ORDER — CHLOROPROCAINE HCL 1 % IJ SOLN
INTRAMUSCULAR | Status: DC | PRN
  Administered 2012-09-28: 20 mL

## 2012-09-28 MED ORDER — LIDOCAINE HCL (CARDIAC) 20 MG/ML IV SOLN
INTRAVENOUS | Status: AC
  Filled 2012-09-28: qty 5

## 2012-09-28 MED ORDER — ONDANSETRON HCL 4 MG/2ML IJ SOLN
INTRAMUSCULAR | Status: AC
  Filled 2012-09-28: qty 2

## 2012-09-28 MED ORDER — PANTOPRAZOLE SODIUM 40 MG PO TBEC: 40.0000 mg | DELAYED_RELEASE_TABLET | Freq: Once | ORAL | Status: DC

## 2012-09-28 MED ORDER — FENTANYL CITRATE 0.05 MG/ML IJ SOLN: 25.0000 ug | INTRAMUSCULAR | Status: DC | PRN

## 2012-09-28 MED ORDER — IBUPROFEN 200 MG PO TABS: 800.0000 mg | ORAL_TABLET | Freq: Four times a day (QID) | ORAL | Status: DC | PRN

## 2012-09-28 MED ORDER — FENTANYL CITRATE 0.05 MG/ML IJ SOLN
INTRAMUSCULAR | Status: AC
  Filled 2012-09-28: qty 2

## 2012-09-28 MED ORDER — PROPOFOL 10 MG/ML IV BOLUS
INTRAVENOUS | Status: DC | PRN
  Administered 2012-09-28: 200 mg via INTRAVENOUS

## 2012-09-28 MED ORDER — MIDAZOLAM HCL 5 MG/5ML IJ SOLN
INTRAMUSCULAR | Status: DC | PRN
  Administered 2012-09-28: 2 mg via INTRAVENOUS

## 2012-09-28 MED ORDER — PROPOFOL 10 MG/ML IV EMUL
INTRAVENOUS | Status: AC
  Filled 2012-09-28: qty 20

## 2012-09-28 MED ORDER — METOCLOPRAMIDE HCL 5 MG/ML IJ SOLN: 10.0000 mg | Freq: Once | INTRAMUSCULAR | Status: DC | PRN

## 2012-09-28 MED ORDER — PANTOPRAZOLE SODIUM 40 MG PO TBEC
DELAYED_RELEASE_TABLET | ORAL | Status: DC
Start: 2012-09-28 — End: 2012-09-28
  Filled 2012-09-28: qty 1

## 2012-09-28 MED ORDER — MIDAZOLAM HCL 2 MG/2ML IJ SOLN
INTRAMUSCULAR | Status: AC
  Filled 2012-09-28: qty 2

## 2012-09-28 MED ORDER — ONDANSETRON HCL 4 MG/2ML IJ SOLN
INTRAMUSCULAR | Status: DC | PRN
  Administered 2012-09-28: 4 mg via INTRAVENOUS

## 2012-09-28 MED ORDER — LACTATED RINGERS IV SOLN
INTRAVENOUS | Status: DC | PRN
  Administered 2012-09-28: 3000 mL

## 2012-09-28 SURGICAL SUPPLY — 13 items
ABLATOR ENDOMETRIAL BIPOLAR (ABLATOR) ×2 IMPLANT
CATH ROBINSON RED A/P 16FR (CATHETERS) ×2 IMPLANT
CLOTH BEACON ORANGE TIMEOUT ST (SAFETY) ×2 IMPLANT
CONTAINER PREFILL 10% NBF 60ML (FORM) ×2 IMPLANT
DRESSING TELFA 8X3 (GAUZE/BANDAGES/DRESSINGS) ×2 IMPLANT
GLOVE BIO SURGEON STRL SZ 6.5 (GLOVE) ×2 IMPLANT
GLOVE BIOGEL PI IND STRL 7.0 (GLOVE) ×1 IMPLANT
GLOVE BIOGEL PI INDICATOR 7.0 (GLOVE) ×1
GOWN STRL REIN XL XLG (GOWN DISPOSABLE) ×4 IMPLANT
PACK HYSTEROSCOPY LF (CUSTOM PROCEDURE TRAY) ×2 IMPLANT
PAD OB MATERNITY 4.3X12.25 (PERSONAL CARE ITEMS) ×2 IMPLANT
TOWEL OR 17X24 6PK STRL BLUE (TOWEL DISPOSABLE) ×4 IMPLANT
WATER STERILE IRR 1000ML POUR (IV SOLUTION) ×2 IMPLANT

## 2012-09-28 NOTE — Anesthesia Procedure Notes (Signed)
Procedure Name: LMA Insertion Date/Time: 09/28/2012 9:55 AM Performed by: Graciela Husbands Pre-anesthesia Checklist: Emergency Drugs available, Timeout performed, Suction available, Patient identified and Patient being monitored Patient Re-evaluated:Patient Re-evaluated prior to inductionOxygen Delivery Method: Circle system utilized Preoxygenation: Pre-oxygenation with 100% oxygen Intubation Type: IV induction LMA: LMA inserted LMA Size: 4.0 Number of attempts: 1 Airway Equipment and Method: Stylet Placement Confirmation: positive ETCO2 and breath sounds checked- equal and bilateral Tube secured with: Tape Dental Injury: Teeth and Oropharynx as per pre-operative assessment

## 2012-09-28 NOTE — Anesthesia Preprocedure Evaluation (Signed)
Anesthesia Evaluation  Patient identified by MRN, date of birth, ID band Patient awake    Reviewed: Allergy & Precautions, H&P , NPO status , Patient's Chart, lab work & pertinent test results  Airway Mallampati: III TM Distance: >3 FB Neck ROM: Full    Dental no notable dental hx. (+) Teeth Intact   Pulmonary neg pulmonary ROS,  breath sounds clear to auscultation  Pulmonary exam normal       Cardiovascular hypertension, Pt. on medications Rhythm:Regular Rate:Normal     Neuro/Psych PSYCHIATRIC DISORDERS Depression negative neurological ROS     GI/Hepatic Neg liver ROS, GERD-  Medicated and Controlled,  Endo/Other  negative endocrine ROSObesity  Renal/GU negative Renal ROS  negative genitourinary   Musculoskeletal negative musculoskeletal ROS (+)   Abdominal (+) + obese,   Peds  Hematology negative hematology ROS (+)   Anesthesia Other Findings   Reproductive/Obstetrics Endometrial Mass Menorrhagia                           Anesthesia Physical Anesthesia Plan  ASA: II  Anesthesia Plan: General   Post-op Pain Management:    Induction: Intravenous  Airway Management Planned: LMA  Additional Equipment:   Intra-op Plan:   Post-operative Plan: Extubation in OR  Informed Consent: I have reviewed the patients History and Physical, chart, labs and discussed the procedure including the risks, benefits and alternatives for the proposed anesthesia with the patient or authorized representative who has indicated his/her understanding and acceptance.   Dental advisory given  Plan Discussed with: CRNA, Anesthesiologist and Surgeon  Anesthesia Plan Comments:         Anesthesia Quick Evaluation

## 2012-09-28 NOTE — Brief Op Note (Signed)
09/28/2012  11:43 AM  PATIENT:  Cathy Parrish  42 y.o. female  PRE-OPERATIVE DIAGNOSIS:  Endometrial Mass, DUB  POST-OPERATIVE DIAGNOSIS:  endometrial polyps,DUB  PROCEDURE:  Diagnostic hysteroscopy, removal of endometrial polyps, dilation and curettage, Novasure endometrial ablation  SURGEON:  Surgeon(s) and Role:    * Ruddy Swire Cathie Beams, MD - Primary  PHYSICIAN ASSISTANT:   ASSISTANTS: none   ANESTHESIA:   general and paracervical block  EBL:  Total I/O In: 1150 [I.V.:1150] Out: -   BLOOD ADMINISTERED:none  DRAINS: none   LOCAL MEDICATIONS USED:  OTHER nesicaine  SPECIMEN:  Source of Specimen:  endometrial curreting and polyps  DISPOSITION OF SPECIMEN:  PATHOLOGY  COUNTS:  YES  TOURNIQUET:  * No tourniquets in log *  DICTATION: .Other Dictation: Dictation Number [id number]  PLAN OF CARE: Discharge to home after PACU  PATIENT DISPOSITION:  PACU - hemodynamically stable.   Delay start of Pharmacological VTE agent (>24hrs) due to surgical blood loss or risk of bleeding: no

## 2012-09-28 NOTE — Transfer of Care (Signed)
Immediate Anesthesia Transfer of Care Note  Patient: Cathy Parrish  Procedure(s) Performed: Procedure(s): DILATATION & CURETTAGE/HYSTEROSCOPY WITH NOVASURE ABLATION; And Resectoscope (N/A)  Patient Location: PACU  Anesthesia Type:General  Level of Consciousness: awake, alert  and oriented  Airway & Oxygen Therapy: Patient Spontanous Breathing  Post-op Assessment: Report given to PACU RN and Post -op Vital signs reviewed and stable  Post vital signs: Reviewed and stable  Complications: No apparent anesthesia complications

## 2012-09-28 NOTE — Anesthesia Postprocedure Evaluation (Signed)
  Anesthesia Post-op Note  Patient: Cathy Parrish  Procedure(s) Performed: Procedure(s): DILATATION & CURETTAGE/HYSTEROSCOPY WITH NOVASURE ABLATION; And Resectoscope (N/A)  Patient Location: PACU  Anesthesia Type:General  Level of Consciousness: awake, alert  and oriented  Airway and Oxygen Therapy: Patient Spontanous Breathing  Post-op Pain: none  Post-op Assessment: Post-op Vital signs reviewed, Patient's Cardiovascular Status Stable, Respiratory Function Stable, Patent Airway, No signs of Nausea or vomiting and Pain level controlled  Post-op Vital Signs: Reviewed and stable  Complications: No apparent anesthesia complications

## 2012-09-29 ENCOUNTER — Encounter (HOSPITAL_COMMUNITY): Payer: Self-pay | Admitting: Obstetrics and Gynecology

## 2012-09-29 NOTE — Op Note (Signed)
NAMEMarland Parrish  MAZELLE, HUEBERT NO.:  [id number]  MEDICAL RECORD NO.:  [id number]  LOCATION:  [facility]                          FACILITY:  [facility]  PHYSICIAN:  Maxie Better, M.D.DATE OF BIRTH:  May 14, 1970  DATE OF PROCEDURE:  09/28/2012 DATE OF DISCHARGE:  09/28/2012                              OPERATIVE REPORT   PREOPERATIVE DIAGNOSES: 1. Dysfunction uterine bleeding. 2. Endometrial polyps.  PROCEDURES: 1. Diagnostic hysteroscopy. 2. Removal of endometrial polyps. 3. Dilation and curettage. 4. NovaSure endometrial ablation.  POSTOPERATIVE DIAGNOSIS: 1. Dysfunction uterine bleeding. 2. Endometrial polyps.  ANESTHESIA:  General, paracervical block.  SURGEON:  Maxie Better, MD  ASSISTANT:  None.  DESCRIPTION OF PROCEDURE:  Under adequate general anesthesia, the patient was placed in the dorsal lithotomy position.  She was sterilely prepped and draped in usual fashion.  Bladder was catheterized for moderate amount of urine.  Examination under anesthesia revealed a retroverted uterus.  No adnexal masses could be appreciated.  Bivalve speculum was placed in the vagina.  Single-tooth tenaculum was placed on the anterior lip of the cervix.  1% Nesacaine was injected paracervically at 3 and 9 o'clock position.  The endocervical canal sounded to 4 cm.  The uterus sounded to 9 cm.  Uterine cavity length was at 5.5 cm.  At that point, a diagnostic hysteroscope was introduced into the uterine cavity.  A large polypoid lesion was noted protruding through the internal os and fluffy tissue was noted.  The hysteroscope was removed.  Polyp  forceps was then used to explore the cavity and ultimately removed the polypoid lesion followed by curetting of the uterine cavity with large amount of tissue was noted.  The hysteroscope was then inserted.  The cavity was noted to have all the tissue that was polypoid removed.  At that point, it was felt that the NovaSure  ablation could be done.  The NovaSure apparatus was inserted.  Cavity width of 4 was noted with a power of 110, the ablation was 1 minute and 23 seconds was done.  The apparatus removed.  The hysteroscope was inserted.  Good ablation throughout.  At which time, all instruments were then removed from the vagina.  SPECIMEN:  Endometrial curetting with polyps sent to Pathology.  ESTIMATED BLOOD LOSS:  Minimal.  COMPLICATION:  None.  The patient tolerated the procedure well, was transferred to the recovery room in stable condition.     Maxie Better, M.D.     [place]/MEDQ  D:  09/29/2012  T:  09/29/2012  Job:  [id number]

## 2013-08-23 ENCOUNTER — Other Ambulatory Visit: Payer: Self-pay | Admitting: Family Medicine

## 2013-08-23 ENCOUNTER — Other Ambulatory Visit (HOSPITAL_COMMUNITY)
Admission: RE | Admit: 2013-08-23 | Discharge: 2013-08-23 | Disposition: A | Discharge status: Home / Self Care | Payer: BC Managed Care – PPO | Source: Ambulatory Visit | Attending: Family Medicine | Admitting: Family Medicine

## 2013-08-23 DIAGNOSIS — N760 Acute vaginitis: Secondary | ICD-10-CM | POA: Insufficient documentation

## 2013-08-23 DIAGNOSIS — Z113 Encounter for screening for infections with a predominantly sexual mode of transmission: Secondary | ICD-10-CM | POA: Insufficient documentation

## 2014-07-03 ENCOUNTER — Emergency Department (INDEPENDENT_AMBULATORY_CARE_PROVIDER_SITE_OTHER): Payer: BLUE CROSS/BLUE SHIELD

## 2014-07-03 ENCOUNTER — Emergency Department (INDEPENDENT_AMBULATORY_CARE_PROVIDER_SITE_OTHER)
Admission: EM | Admit: 2014-07-03 | Discharge: 2014-07-03 | Disposition: A | Discharge status: Home / Self Care | Payer: BLUE CROSS/BLUE SHIELD | Source: Home / Self Care | Attending: Family Medicine | Admitting: Family Medicine

## 2014-07-03 ENCOUNTER — Encounter (HOSPITAL_COMMUNITY): Payer: Self-pay | Admitting: Emergency Medicine

## 2014-07-03 DIAGNOSIS — J069 Acute upper respiratory infection, unspecified: Secondary | ICD-10-CM | POA: Diagnosis not present

## 2014-07-03 MED ORDER — FLUTICASONE PROPIONATE 50 MCG/ACT NA SUSP: 2.0000 | Freq: Two times a day (BID) | NASAL | Status: DC

## 2014-07-03 MED ORDER — ALBUTEROL SULFATE HFA 108 (90 BASE) MCG/ACT IN AERS: 2.0000 | INHALATION_SPRAY | RESPIRATORY_TRACT | Status: DC | PRN

## 2014-07-03 MED ORDER — IPRATROPIUM-ALBUTEROL 0.5-2.5 (3) MG/3ML IN SOLN
3.0000 mL | Freq: Once | RESPIRATORY_TRACT | Status: AC
  Administered 2014-07-03: 3 mL via RESPIRATORY_TRACT

## 2014-07-03 MED ORDER — IPRATROPIUM-ALBUTEROL 0.5-2.5 (3) MG/3ML IN SOLN
RESPIRATORY_TRACT | Status: AC
  Filled 2014-07-03: qty 3

## 2014-07-03 MED ORDER — PREDNISONE 10 MG PO TABS: ORAL_TABLET | ORAL | Status: DC

## 2014-07-03 MED ORDER — DOXYCYCLINE HYCLATE 100 MG PO CAPS: 100.0000 mg | ORAL_CAPSULE | Freq: Two times a day (BID) | ORAL | Status: DC

## 2014-07-03 NOTE — Discharge Instructions (Signed)
Start the antibiotic if you continue to get sicker for the next few days or if you are not getting any better in one week   Upper Respiratory Infection, Adult An upper respiratory infection (URI) is also sometimes known as the common cold. The upper respiratory tract includes the nose, sinuses, throat, trachea, and bronchi. Bronchi are the airways leading to the lungs. Most people improve within 1 week, but symptoms can last up to 2 weeks. A residual cough may last even longer.  CAUSES Many different viruses can infect the tissues lining the upper respiratory tract. The tissues become irritated and inflamed and often become very moist. Mucus production is also common. A cold is contagious. You can easily spread the virus to others by oral contact. This includes kissing, sharing a glass, coughing, or sneezing. Touching your mouth or nose and then touching a surface, which is then touched by another person, can also spread the virus. SYMPTOMS  Symptoms typically develop 1 to 3 days after you come in contact with a cold virus. Symptoms vary from person to person. They may include:  Runny nose.  Sneezing.  Nasal congestion.  Sinus irritation.  Sore throat.  Loss of voice (laryngitis).  Cough.  Fatigue.  Muscle aches.  Loss of appetite.  Headache.  Low-grade fever. DIAGNOSIS  You might diagnose your own cold based on familiar symptoms, since most people get a cold 2 to 3 times a year. Your caregiver can confirm this based on your exam. Most importantly, your caregiver can check that your symptoms are not due to another disease such as strep throat, sinusitis, pneumonia, asthma, or epiglottitis. Blood tests, throat tests, and X-rays are not necessary to diagnose a common cold, but they may sometimes be helpful in excluding other more serious diseases. Your caregiver will decide if any further tests are required. RISKS AND COMPLICATIONS  You may be at risk for a more severe case of the  common cold if you smoke cigarettes, have chronic heart disease (such as heart failure) or lung disease (such as asthma), or if you have a weakened immune system. The very young and very old are also at risk for more serious infections. Bacterial sinusitis, middle ear infections, and bacterial pneumonia can complicate the common cold. The common cold can worsen asthma and chronic obstructive pulmonary disease (COPD). Sometimes, these complications can require emergency medical care and may be life-threatening. PREVENTION  The best way to protect against getting a cold is to practice good hygiene. Avoid oral or hand contact with people with cold symptoms. Wash your hands often if contact occurs. There is no clear evidence that vitamin C, vitamin E, echinacea, or exercise reduces the chance of developing a cold. However, it is always recommended to get plenty of rest and practice good nutrition. TREATMENT  Treatment is directed at relieving symptoms. There is no cure. Antibiotics are not effective, because the infection is caused by a virus, not by bacteria. Treatment may include:  Increased fluid intake. Sports drinks offer valuable electrolytes, sugars, and fluids.  Breathing heated mist or steam (vaporizer or shower).  Eating chicken soup or other clear broths, and maintaining good nutrition.  Getting plenty of rest.  Using gargles or lozenges for comfort.  Controlling fevers with ibuprofen or acetaminophen as directed by your caregiver.  Increasing usage of your inhaler if you have asthma. Zinc gel and zinc lozenges, taken in the first 24 hours of the common cold, can shorten the duration and lessen the severity of  symptoms. Pain medicines may help with fever, muscle aches, and throat pain. A variety of non-prescription medicines are available to treat congestion and runny nose. Your caregiver can make recommendations and may suggest nasal or lung inhalers for other symptoms.  HOME CARE  INSTRUCTIONS   Only take over-the-counter or prescription medicines for pain, discomfort, or fever as directed by your caregiver.  Use a warm mist humidifier or inhale steam from a shower to increase air moisture. This may keep secretions moist and make it easier to breathe.  Drink enough water and fluids to keep your urine clear or pale yellow.  Rest as needed.  Return to work when your temperature has returned to normal or as your caregiver advises. You may need to stay home longer to avoid infecting others. You can also use a face mask and careful hand washing to prevent spread of the virus. SEEK MEDICAL CARE IF:   After the first few days, you feel you are getting worse rather than better.  You need your caregiver's advice about medicines to control symptoms.  You develop chills, worsening shortness of breath, or brown or red sputum. These may be signs of pneumonia.  You develop yellow or brown nasal discharge or pain in the face, especially when you bend forward. These may be signs of sinusitis.  You develop a fever, swollen neck glands, pain with swallowing, or white areas in the back of your throat. These may be signs of strep throat. SEEK IMMEDIATE MEDICAL CARE IF:   You have a fever.  You develop severe or persistent headache, ear pain, sinus pain, or chest pain.  You develop wheezing, a prolonged cough, cough up blood, or have a change in your usual mucus (if you have chronic lung disease).  You develop sore muscles or a stiff neck. Document Released: 08/11/2000 Document Revised: 05/10/2011 Document Reviewed: 05/23/2013 [facility] Patient Information 2015 [place], [place]. This information is not intended to replace advice given to you by your health care provider. Make sure you discuss any questions you have with your health care provider.

## 2014-07-03 NOTE — ED Notes (Signed)
C/o cold/flu like sx onset 3 days Sx include cough, runny nose, congestion, BA, chills, fevers Taking OTC cold meds w/no relief Alert, no signs of acute distress.

## 2014-07-03 NOTE — ED Provider Notes (Signed)
CSN: [id number]     Arrival date & time 07/03/14  1032 History   First MD Initiated Contact with Patient 07/03/14 1222     Chief Complaint  Patient presents with  . URI   (Consider location/radiation/quality/duration/timing/severity/associated sxs/prior Treatment) HPI     44 year old female presents complaining of cold and flulike symptoms. She has cough, congestion, frontal headache, bilateral ear pain and pressure, fever, chills, body aches. Symptoms started 3 days ago and have gradually worsened. She had a temperature 102F yesterday. She is taking over-the-counter medications with minimal relief. She also admits to some chest pain with taking a deep breath only. No chest pain at rest or exertional chest pain and no history of any coronary artery disease. No recent travel or sick contacts. No history of sinusitis nor any history of pneumonia.  Past Medical History  Diagnosis Date  . Hypertension   . Depression    Past Surgical History  Procedure Laterality Date  . Dilation and curettage of uterus  abortion  . Colonoscopy  07/13/2011    Procedure: COLONOSCOPY;  Surgeon: Gatha Mayer, MD;  Location: [place];  Service: Endoscopy;  Laterality: N/A;  . Esophagogastroduodenoscopy  07/13/2011    Procedure: ESOPHAGOGASTRODUODENOSCOPY (EGD);  Surgeon: Gatha Mayer, MD;  Location: [facility];  Service: Endoscopy;  Laterality: N/A;  . Dilitation & currettage/hystroscopy with novasure ablation N/A 09/28/2012    Procedure: DILATATION & CURETTAGE/HYSTEROSCOPY WITH NOVASURE ABLATION; And Resectoscope;  Surgeon: Marvene Staff, MD;  Location: [facility];  Service: Gynecology;  Laterality: N/A;   No family history on file. History  Substance Use Topics  . Smoking status: Never Smoker   . Smokeless tobacco: Not on file  . Alcohol Use: 1.8 oz/week    3 Cans of beer per week     Comment: twice a week   OB History    No data available     Review of Systems  Constitutional: Positive for  fever, chills and fatigue.  HENT: Positive for congestion, ear pain, sinus pressure, sneezing and sore throat.   Respiratory: Positive for cough and shortness of breath.   Cardiovascular: Positive for chest pain.  Gastrointestinal: Negative for nausea, vomiting and abdominal pain.  Musculoskeletal: Negative for myalgias.  Skin: Negative for rash.  Neurological: Positive for headaches.  All other systems reviewed and are negative.   Allergies  Review of patient's allergies indicates no known allergies.  Home Medications   Prior to Admission medications   Medication Sig Start Date End Date Taking? Authorizing Provider  hydrochlorothiazide (HYDRODIURIL) 25 MG tablet Take 25 mg by mouth daily.   Yes Historical Provider, MD  Hydrocodone-Acetaminophen (VICODIN) 5-300 MG TABS Take 1 tablet by mouth 4 (four) times daily as needed. 09/28/12  Yes Sheronette Cousins, MD  albuterol (PROVENTIL HFA;VENTOLIN HFA) 108 (90 BASE) MCG/ACT inhaler Inhale 2 puffs into the lungs every 4 (four) hours as needed for wheezing. 07/03/14   Liam Graham, PA-C  Aspirin-Salicylamide-Caffeine (BC HEADACHE POWDER PO) Take 1 packet by mouth daily as needed (for headache).    Historical Provider, MD  dicyclomine (BENTYL) 10 MG capsule Take 10 mg by mouth 2 (two) times daily with a meal. 30 prior to meal    Historical Provider, MD  doxycycline (VIBRAMYCIN) 100 MG capsule Take 1 capsule (100 mg total) by mouth 2 (two) times daily. 07/03/14   Freeman Caldron Frankie Scipio, PA-C  fluticasone (FLONASE) 50 MCG/ACT nasal spray Place 2 sprays into both nostrils 2 (two) times daily. Decrease to 2  sprays/nostril daily after 5 days 07/03/14   Liam Graham, PA-C  ibuprofen (ADVIL,MOTRIN) 200 MG tablet Take 4 tablets (800 mg total) by mouth every 6 (six) hours as needed for pain. 09/28/12   Servando Salina, MD  predniSONE (DELTASONE) 10 MG tablet 4 tabs PO QD for 4 days; 3 tabs PO QD for 3 days; 2 tabs PO QD for 2 days; 1 tab PO QD for 1 day 07/03/14    Liam Graham, PA-C  sertraline (ZOLOFT) 50 MG tablet Take 50 mg by mouth daily.    Historical Provider, MD  Tetrahydrozoline HCl (VISINE OP) Apply 1 drop to eye as needed (for allergies).    Historical Provider, MD   BP 127/87 mmHg  Pulse 99  Temp(Src) 98.4 F (36.9 C) (Oral)  Resp 20  SpO2 98% Physical Exam  Constitutional: She is oriented to person, place, and time. Vital signs are normal. She appears well-developed and well-nourished. No distress.  HENT:  Head: Normocephalic and atraumatic.  Right Ear: External ear normal.  Left Ear: External ear normal.  Nose: Nose normal.  Mouth/Throat: Oropharynx is clear and moist. No oropharyngeal exudate.  Eyes: Conjunctivae are normal.  Neck: Normal range of motion. Neck supple.  Cardiovascular: Normal rate, regular rhythm, normal heart sounds and intact distal pulses.   Pulmonary/Chest: Effort normal. No respiratory distress. She has wheezes (mild, expiratory in bases, bilateral ).  Lymphadenopathy:    She has cervical adenopathy (posterior cervical ).  Neurological: She is alert and oriented to person, place, and time. She has normal strength. Coordination normal.  Skin: Skin is warm and dry. No rash noted. She is not diaphoretic.  Psychiatric: She has a normal mood and affect. Judgment normal.  Nursing note and vitals reviewed.   ED Course  Procedures (including critical care time) Labs Review Labs Reviewed - No data to display  Imaging Review Dg Chest 2 View  07/03/2014   CLINICAL DATA:  Cough and fever 4 days. Sore throat and chest congestion. Shortness of breath.  EXAM: CHEST  2 VIEW  COMPARISON:  05/16/2010  FINDINGS: The heart size and mediastinal contours are within normal limits. Both lungs are clear. The visualized skeletal structures are unremarkable.  IMPRESSION: No active cardiopulmonary disease.   Electronically Signed   By: Earle Gell M.D.   On: 07/03/2014 13:49     MDM   1. URI (upper respiratory infection)     Chest x-ray is normal. She has symptomatic improvement and improvement to auscultation with the breathing treatment. Treat symptomatically for a few days, start antibiotics if she continues to worsen despite the prednisone and inhaler and Flonase, follow-up if no improvement in a few days after that   Meds ordered this encounter  Medications  . ipratropium-albuterol (DUONEB) 0.5-2.5 (3) MG/3ML nebulizer solution 3 mL    Sig:   . predniSONE (DELTASONE) 10 MG tablet    Sig: 4 tabs PO QD for 4 days; 3 tabs PO QD for 3 days; 2 tabs PO QD for 2 days; 1 tab PO QD for 1 day    Dispense:  30 tablet    Refill:  0  . fluticasone (FLONASE) 50 MCG/ACT nasal spray    Sig: Place 2 sprays into both nostrils 2 (two) times daily. Decrease to 2 sprays/nostril daily after 5 days    Dispense:  16 g    Refill:  2  . albuterol (PROVENTIL HFA;VENTOLIN HFA) 108 (90 BASE) MCG/ACT inhaler    Sig: Inhale 2 puffs  into the lungs every 4 (four) hours as needed for wheezing.    Dispense:  1 Inhaler    Refill:  0  . doxycycline (VIBRAMYCIN) 100 MG capsule    Sig: Take 1 capsule (100 mg total) by mouth 2 (two) times daily.    Dispense:  20 capsule    Refill:  Pierz Linnie Mcglocklin, PA-C 07/03/14 1428

## 2015-11-14 LAB — GLUCOSE, POCT (MANUAL RESULT ENTRY): POC Glucose: 126 mg/dl — AB (ref 70–99)

## 2015-11-19 ENCOUNTER — Other Ambulatory Visit: Payer: Self-pay | Admitting: Family Medicine

## 2015-11-19 DIAGNOSIS — Z1231 Encounter for screening mammogram for malignant neoplasm of breast: Secondary | ICD-10-CM

## 2015-11-20 ENCOUNTER — Ambulatory Visit
Admission: RE | Admit: 2015-11-20 | Discharge: 2015-11-20 | Disposition: A | Discharge status: Home / Self Care | Payer: No Typology Code available for payment source | Source: Ambulatory Visit | Attending: Family Medicine | Admitting: Family Medicine

## 2015-11-20 DIAGNOSIS — Z1231 Encounter for screening mammogram for malignant neoplasm of breast: Secondary | ICD-10-CM

## 2016-06-17 ENCOUNTER — Other Ambulatory Visit: Payer: Self-pay | Admitting: Family Medicine

## 2016-06-17 ENCOUNTER — Other Ambulatory Visit (HOSPITAL_COMMUNITY)
Admission: RE | Admit: 2016-06-17 | Discharge: 2016-06-17 | Disposition: A | Discharge status: Home / Self Care | Payer: 59 | Source: Ambulatory Visit | Attending: Family Medicine | Admitting: Family Medicine

## 2016-06-17 DIAGNOSIS — Z113 Encounter for screening for infections with a predominantly sexual mode of transmission: Secondary | ICD-10-CM | POA: Diagnosis present

## 2016-06-17 DIAGNOSIS — Z01419 Encounter for gynecological examination (general) (routine) without abnormal findings: Secondary | ICD-10-CM | POA: Insufficient documentation

## 2016-06-17 DIAGNOSIS — Z1151 Encounter for screening for human papillomavirus (HPV): Secondary | ICD-10-CM | POA: Diagnosis present

## 2016-06-22 LAB — CYTOLOGY - PAP
BACTERIAL VAGINITIS: POSITIVE — AB
CHLAMYDIA, DNA PROBE: NEGATIVE
Candida vaginitis: NEGATIVE
DIAGNOSIS: NEGATIVE
HPV (WINDOPATH): NOT DETECTED
NEISSERIA GONORRHEA: NEGATIVE
Trichomonas: NEGATIVE

## 2016-08-25 ENCOUNTER — Encounter (HOSPITAL_COMMUNITY): Payer: Self-pay

## 2016-08-25 ENCOUNTER — Ambulatory Visit (HOSPITAL_COMMUNITY)
Admission: EM | Admit: 2016-08-25 | Discharge: 2016-08-25 | Disposition: A | Discharge status: Home / Self Care | Payer: 59 | Attending: Family Medicine | Admitting: Family Medicine

## 2016-08-25 DIAGNOSIS — R300 Dysuria: Secondary | ICD-10-CM | POA: Insufficient documentation

## 2016-08-25 DIAGNOSIS — R350 Frequency of micturition: Secondary | ICD-10-CM

## 2016-08-25 LAB — POCT URINALYSIS DIP (DEVICE)
BILIRUBIN URINE: NEGATIVE
Glucose, UA: NEGATIVE mg/dL
Ketones, ur: NEGATIVE mg/dL
Leukocytes, UA: NEGATIVE
NITRITE: NEGATIVE
PH: 5.5 (ref 5.0–8.0)
PROTEIN: NEGATIVE mg/dL
Specific Gravity, Urine: 1.025 (ref 1.005–1.030)
Urobilinogen, UA: 1 mg/dL (ref 0.0–1.0)

## 2016-08-25 MED ORDER — PHENAZOPYRIDINE HCL 200 MG PO TABS: 200.0000 mg | ORAL_TABLET | Freq: Three times a day (TID) | ORAL | 0 refills | Status: DC

## 2016-08-25 MED ORDER — CEPHALEXIN 500 MG PO CAPS: 500.0000 mg | ORAL_CAPSULE | Freq: Four times a day (QID) | ORAL | 0 refills | Status: DC

## 2016-08-25 NOTE — ED Triage Notes (Signed)
Patient presents to [facility] with possible UTI x2 weeks, pt has been using home remedies to treat symptoms but has had no relief, pt states she is feeling pressure during urination, no acute distress

## 2016-08-26 NOTE — ED Provider Notes (Signed)
CSN: [id number]     Arrival date & time 08/25/16  1653 History   None    Chief Complaint  Patient presents with  . Urinary Tract Infection   (Consider location/radiation/quality/duration/timing/severity/associated sxs/prior Treatment) Patient c/o UTI sx's for 2 weeks.   The history is provided by the patient.  Urinary Tract Infection  Pain quality:  Aching Pain severity:  Mild Onset quality:  Gradual Duration:  2 weeks Timing:  Constant Progression:  Worsening Recent urinary tract infections: no   Relieved by:  Nothing Ineffective treatments:  None tried   Past Medical History:  Diagnosis Date  . Depression   . Hypertension    Past Surgical History:  Procedure Laterality Date  . COLONOSCOPY  07/13/2011   Procedure: COLONOSCOPY;  Surgeon: Gatha Mayer, MD;  Location: [place];  Service: Endoscopy;  Laterality: N/A;  . DILATION AND CURETTAGE OF UTERUS  abortion  . DILITATION & CURRETTAGE/HYSTROSCOPY WITH NOVASURE ABLATION N/A 09/28/2012   Procedure: DILATATION & CURETTAGE/HYSTEROSCOPY WITH NOVASURE ABLATION; And Resectoscope;  Surgeon: Marvene Staff, MD;  Location: [facility];  Service: Gynecology;  Laterality: N/A;  . ESOPHAGOGASTRODUODENOSCOPY  07/13/2011   Procedure: ESOPHAGOGASTRODUODENOSCOPY (EGD);  Surgeon: Gatha Mayer, MD;  Location: [facility]COPY;  Service: Endoscopy;  Laterality: N/A;   History reviewed. No pertinent family history. Social History  Substance Use Topics  . Smoking status: Never Smoker  . Smokeless tobacco: Never Used  . Alcohol use 1.8 oz/week    3 Cans of beer per week     Comment: twice a week   OB History    No data available     Review of Systems  Constitutional: Negative.   HENT: Negative.   Eyes: Negative.   Respiratory: Negative.   Cardiovascular: Negative.   Gastrointestinal: Negative.   Endocrine: Negative.   Genitourinary: Positive for difficulty urinating.  Musculoskeletal: Negative.   Skin: Negative.    Allergic/Immunologic: Negative.   Neurological: Negative.     Allergies  Patient has no known allergies.  Home Medications   Prior to Admission medications   Medication Sig Start Date End Date Taking? Authorizing Provider  albuterol (PROVENTIL HFA;VENTOLIN HFA) 108 (90 BASE) MCG/ACT inhaler Inhale 2 puffs into the lungs every 4 (four) hours as needed for wheezing. 07/03/14  Yes Baker, Freeman Caldron, PA-C  Aspirin-Salicylamide-Caffeine (BC HEADACHE POWDER PO) Take 1 packet by mouth daily as needed (for headache).   Yes [provider]  dicyclomine (BENTYL) 10 MG capsule Take 10 mg by mouth 2 (two) times daily with a meal. 30 prior to meal   Yes [provider]  fluticasone (FLONASE) 50 MCG/ACT nasal spray Place 2 sprays into both nostrils 2 (two) times daily. Decrease to 2 sprays/nostril daily after 5 days 07/03/14  Yes Baker, Zachary H, PA-C  valsartan-hydrochlorothiazide (DIOVAN-HCT) 160-12.5 MG tablet Take 1 tablet by mouth daily.   Yes [provider]  cephALEXin (KEFLEX) 500 MG capsule Take 1 capsule (500 mg total) by mouth 4 (four) times daily. 08/25/16   Lysbeth Penner, FNP  doxycycline (VIBRAMYCIN) 100 MG capsule Take 1 capsule (100 mg total) by mouth 2 (two) times daily. 07/03/14   Baker, Freeman Caldron, PA-C  hydrochlorothiazide (HYDRODIURIL) 25 MG tablet Take 25 mg by mouth daily.    [provider]  Hydrocodone-Acetaminophen (VICODIN) 5-300 MG TABS Take 1 tablet by mouth 4 (four) times daily as needed. 09/28/12   Servando Salina, MD  ibuprofen (ADVIL,MOTRIN) 200 MG tablet Take 4 tablets (800 mg total) by  mouth every 6 (six) hours as needed for pain. 09/28/12   Servando Salina, MD  phenazopyridine (PYRIDIUM) 200 MG tablet Take 1 tablet (200 mg total) by mouth 3 (three) times daily. 08/25/16   Lysbeth Penner, FNP  predniSONE (DELTASONE) 10 MG tablet 4 tabs PO QD for 4 days; 3 tabs PO QD for 3 days; 2 tabs PO QD for 2 days; 1 tab PO QD for 1 day 07/03/14    Liam Graham, PA-C  sertraline (ZOLOFT) 50 MG tablet Take 50 mg by mouth daily.    [provider]  Tetrahydrozoline HCl (VISINE OP) Apply 1 drop to eye as needed (for allergies).    [provider]   Meds Ordered and Administered this Visit  Medications - No data to display  BP 123/75 (BP Location: Left Arm)   Pulse 83   Temp 98 F (36.7 C) (Oral)   Resp 17   SpO2 99%  No data found.   Physical Exam  Constitutional: She is oriented to person, place, and time. She appears well-developed and well-nourished.  HENT:  Head: Normocephalic and atraumatic.  Eyes: Conjunctivae and EOM are normal. Pupils are equal, round, and reactive to light.  Neck: Normal range of motion. Neck supple.  Cardiovascular: Normal rate, regular rhythm and normal heart sounds.   Pulmonary/Chest: Effort normal and breath sounds normal.  Neurological: She is alert and oriented to person, place, and time.  Nursing note and vitals reviewed.   Urgent Care Course     Procedures (including critical care time)  Labs Review Labs Reviewed  POCT URINALYSIS DIP (DEVICE) - Abnormal; Notable for the following:       Result Value   Hgb urine dipstick TRACE (*)    All other components within normal limits  URINE CULTURE    Imaging Review No results found.   Visual Acuity Review  Right Eye Distance:   Left Eye Distance:   Bilateral Distance:    Right Eye Near:   Left Eye Near:    Bilateral Near:         MDM   1. Dysuria   2. Urinary frequency    UA cx  Keflex 500mg  one po qid x 5 d #20 Pyridium 200mg  one po tid x 2days      Lysbeth Penner, [place] 08/26/16 1023

## 2016-08-28 LAB — URINE CULTURE: Culture: 20000 — AB

## 2016-10-30 ENCOUNTER — Encounter (HOSPITAL_COMMUNITY): Payer: Self-pay | Admitting: Emergency Medicine

## 2016-10-30 ENCOUNTER — Ambulatory Visit (HOSPITAL_COMMUNITY)
Admission: EM | Admit: 2016-10-30 | Discharge: 2016-10-30 | Disposition: A | Discharge status: Home / Self Care | Payer: 59 | Attending: Radiology | Admitting: Radiology

## 2016-10-30 DIAGNOSIS — N611 Abscess of the breast and nipple: Secondary | ICD-10-CM | POA: Diagnosis not present

## 2016-10-30 DIAGNOSIS — E538 Deficiency of other specified B group vitamins: Secondary | ICD-10-CM | POA: Insufficient documentation

## 2016-10-30 DIAGNOSIS — Z791 Long term (current) use of non-steroidal anti-inflammatories (NSAID): Secondary | ICD-10-CM | POA: Diagnosis not present

## 2016-10-30 DIAGNOSIS — D509 Iron deficiency anemia, unspecified: Secondary | ICD-10-CM | POA: Insufficient documentation

## 2016-10-30 DIAGNOSIS — I10 Essential (primary) hypertension: Secondary | ICD-10-CM | POA: Diagnosis not present

## 2016-10-30 DIAGNOSIS — Z7982 Long term (current) use of aspirin: Secondary | ICD-10-CM | POA: Insufficient documentation

## 2016-10-30 DIAGNOSIS — N6310 Unspecified lump in the right breast, unspecified quadrant: Secondary | ICD-10-CM | POA: Insufficient documentation

## 2016-10-30 DIAGNOSIS — F329 Major depressive disorder, single episode, unspecified: Secondary | ICD-10-CM | POA: Diagnosis not present

## 2016-10-30 DIAGNOSIS — R531 Weakness: Secondary | ICD-10-CM | POA: Insufficient documentation

## 2016-10-30 MED ORDER — LIDOCAINE HCL 2 % IJ SOLN
INTRAMUSCULAR | Status: AC
  Filled 2016-10-30: qty 20

## 2016-10-30 NOTE — ED Provider Notes (Addendum)
[facility]    CSN: [id number] Arrival date & time: 10/30/16  1713     History   Chief Complaint Chief Complaint  Patient presents with  . Breast Mass    HPI Cathy Parrish is a 46 y.o. female.   46 y.o. female presents with  to RLQ f right breast X 2 years that has increased in size and sensitivity X 1 week. Patient states that she was seen yesterday at her PMD and prescribed antibiotics. Patient states she was diagnosed with a cyst. Condition is acute on chronic in nature. Condition is made better by nothing. Condition is made worse by nothing. Patient denies any relief from antibiotics prior to there arrival at this facility. Abscess is draining upon evaluation with a harder area superior to abscess. Patient currently Taking Ceftin.       Past Medical History:  Diagnosis Date  . Depression   . Hypertension     Patient Active Problem List   Diagnosis Date Noted  . B12 deficiency 07/12/2011  . NSAID long-term use-[place] 07/12/2011  . Weakness 07/11/2011  . Iron deficiency anemia 07/11/2011  . Hypertension     Past Surgical History:  Procedure Laterality Date  . COLONOSCOPY  07/13/2011   Procedure: COLONOSCOPY;  Surgeon: Gatha Mayer, MD;  Location: [place];  Service: Endoscopy;  Laterality: N/A;  . DILATION AND CURETTAGE OF UTERUS  abortion  . DILITATION & CURRETTAGE/HYSTROSCOPY WITH NOVASURE ABLATION N/A 09/28/2012   Procedure: DILATATION & CURETTAGE/HYSTEROSCOPY WITH NOVASURE ABLATION; And Resectoscope;  Surgeon: Marvene Staff, MD;  Location: [facility];  Service: Gynecology;  Laterality: N/A;  . ESOPHAGOGASTRODUODENOSCOPY  07/13/2011   Procedure: ESOPHAGOGASTRODUODENOSCOPY (EGD);  Surgeon: Gatha Mayer, MD;  Location: [facility] END[facility]COPY;  Service: Endoscopy;  Laterality: N/A;    OB History    No data available       Home Medications    Prior to Admission medications   Medication Sig Start Date End Date Taking? Authorizing Provider    albuterol (PROVENTIL HFA;VENTOLIN HFA) 108 (90 BASE) MCG/ACT inhaler Inhale 2 puffs into the lungs every 4 (four) hours as needed for wheezing. 07/03/14   Baker, Freeman Caldron, PA-C  Aspirin-Salicylamide-Caffeine (BC HEADACHE POWDER PO) Take 1 packet by mouth daily as needed (for headache).    [provider]  cephALEXin (KEFLEX) 500 MG capsule Take 1 capsule (500 mg total) by mouth 4 (four) times daily. 08/25/16   Lysbeth Penner, FNP  dicyclomine (BENTYL) 10 MG capsule Take 10 mg by mouth 2 (two) times daily with a meal. 30 prior to meal    [provider]  doxycycline (VIBRAMYCIN) 100 MG capsule Take 1 capsule (100 mg total) by mouth 2 (two) times daily. 07/03/14   Baker, Freeman Caldron, PA-C  fluticasone (FLONASE) 50 MCG/ACT nasal spray Place 2 sprays into both nostrils 2 (two) times daily. Decrease to 2 sprays/nostril daily after 5 days 07/03/14   Liam Graham, PA-C  hydrochlorothiazide (HYDRODIURIL) 25 MG tablet Take 25 mg by mouth daily.    [provider]  Hydrocodone-Acetaminophen (VICODIN) 5-300 MG TABS Take 1 tablet by mouth 4 (four) times daily as needed. 09/28/12   Servando Salina, MD  ibuprofen (ADVIL,MOTRIN) 200 MG tablet Take 4 tablets (800 mg total) by mouth every 6 (six) hours as needed for pain. 09/28/12   Servando Salina, MD  phenazopyridine (PYRIDIUM) 200 MG tablet Take 1 tablet (200 mg total) by mouth 3 (three) times daily. 08/25/16   Lysbeth Penner,  FNP  predniSONE (DELTASONE) 10 MG tablet 4 tabs PO QD for 4 days; 3 tabs PO QD for 3 days; 2 tabs PO QD for 2 days; 1 tab PO QD for 1 day 07/03/14   Liam Graham, PA-C  sertraline (ZOLOFT) 50 MG tablet Take 50 mg by mouth daily.    [provider]  Tetrahydrozoline HCl (VISINE OP) Apply 1 drop to eye as needed (for allergies).    [provider]  valsartan-hydrochlorothiazide (DIOVAN-HCT) 160-12.5 MG tablet Take 1 tablet by mouth daily.    [provider]    Family  History History reviewed. No pertinent family history.  Social History Social History  Substance Use Topics  . Smoking status: Never Smoker  . Smokeless tobacco: Never Used  . Alcohol use 1.8 oz/week    3 Cans of beer per week     Comment: twice a week     Allergies   Patient has no known allergies.   Review of Systems Review of Systems  Constitutional: Negative for chills and fever.  HENT: Negative for ear pain and sore throat.   Eyes: Negative for pain and visual disturbance.  Respiratory: Negative for cough and shortness of breath.   Cardiovascular: Negative for chest pain and palpitations.  Gastrointestinal: Negative for abdominal pain and vomiting.  Genitourinary: Negative for dysuria and hematuria.  Musculoskeletal: Negative for arthralgias and back pain.  Skin: Negative for color change and rash.       Abscess to RLQ of right breast, painfulr  Neurological: Negative for seizures and syncope.  All other systems reviewed and are negative.    Physical Exam Triage Vital Signs ED Triage Vitals  Enc Vitals Group     BP 10/30/16 1812 (!) 146/64     Pulse Rate 10/30/16 1812 87     Resp 10/30/16 1812 20     Temp 10/30/16 1812 99 F (37.2 C)     Temp Source 10/30/16 1812 Oral     SpO2 10/30/16 1812 98 %     Weight --      Height --      Head Circumference --      Peak Flow --      Pain Score 10/30/16 1809 10     Pain Loc --      Pain Edu? --      Excl. in [place]? --    No data found.   Updated Vital Signs BP (!) 146/64 (BP Location: Left Arm)   Pulse 87   Temp 99 F (37.2 C) (Oral)   Resp 20   SpO2 98%   Visual Acuity Right Eye Distance:   Left Eye Distance:   Bilateral Distance:    Right Eye Near:   Left Eye Near:    Bilateral Near:     Physical Exam  Constitutional: She appears well-developed and well-nourished. No distress.  HENT:  Head: Normocephalic and atraumatic.  Eyes: Conjunctivae are normal.  Neck: Neck supple.  Cardiovascular:  Normal rate and regular rhythm.   No murmur heard. Pulmonary/Chest: Effort normal and breath sounds normal. No respiratory distress.  Abdominal: Soft. There is no tenderness.  Musculoskeletal: She exhibits no edema.  Neurological: She is alert.  Skin: Skin is warm and dry.  Abscess noted to RLQ breast with non draining nodule superior.   Psychiatric: She has a normal mood and affect.  Nursing note and vitals reviewed.    UC Treatments / Results  Labs (all labs ordered are listed, but  only abnormal results are displayed) Labs Reviewed - No data to display  EKG  EKG Interpretation None       Radiology No results found.  Procedures .Marland KitchenIncision and Drainage Date/Time: 10/30/2016 6:55 PM Performed by: Jacqualine Mau Authorized by: Rushie Nyhan C   Consent:    Consent obtained:  Verbal Location:    Type:  Abscess   Location: right breast. Procedure details:    Needle aspiration: no     Wound management:  Probed and deloculated   Drainage:  Purulent   Packing materials:  None Post-procedure details:    Patient tolerance of procedure:  Tolerated well, no immediate complications    (including critical care time)  Medications Ordered in UC Medications - No data to display   Initial Impression / Assessment and Plan / UC Course  I have reviewed the triage vital signs and the nursing notes.  Pertinent labs & imaging results that were available during my care of the patient were reviewed by me and considered in my medical decision making (see chart for details).       Final Clinical Impressions(s) / UC Diagnoses   Final diagnoses:  None    New Prescriptions New Prescriptions   No medications on file     Controlled Substance Prescriptions [place] Controlled Substance Registry consulted? Not Applicable   Jacqualine Mau, NP 10/30/16 1857    Jacqualine Mau, NP 10/30/16 1919

## 2016-10-30 NOTE — Discharge Instructions (Signed)
Apply warm compresses and continue on antibiotics as previously prescribed.

## 2016-10-30 NOTE — ED Triage Notes (Signed)
Pt here for an abscess under right breast x2 years... sts last week it's been getting more painful associated w/drainage  PCP gave her cefuroxime 250 mg w/no relief  Denies fevers, chills  A&O x4... NAD... Ambulatory

## 2016-11-01 LAB — AEROBIC CULTURE W GRAM STAIN (SUPERFICIAL SPECIMEN): Gram Stain: NONE SEEN

## 2016-11-01 LAB — AEROBIC CULTURE  (SUPERFICIAL SPECIMEN)

## 2016-11-02 ENCOUNTER — Telehealth (HOSPITAL_COMMUNITY): Payer: Self-pay | Admitting: Internal Medicine

## 2016-11-02 MED ORDER — AMOXICILLIN 500 MG PO CAPS: 500.0000 mg | ORAL_CAPSULE | Freq: Three times a day (TID) | ORAL | 1 refills | Status: AC

## 2016-11-02 NOTE — Telephone Encounter (Signed)
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  Rx amoxicillin for 8 weeks sent to the pharmacy of record, [facility].  Followup with PCP in about a month to determine if further evaluation (imaging, consultation with infectious disease specialist) would be helpful.  Jodi Mourning MD

## 2016-11-03 ENCOUNTER — Other Ambulatory Visit: Payer: Self-pay | Admitting: Radiology

## 2016-11-03 DIAGNOSIS — N644 Mastodynia: Secondary | ICD-10-CM

## 2016-11-08 ENCOUNTER — Ambulatory Visit
Admission: RE | Admit: 2016-11-08 | Discharge: 2016-11-08 | Disposition: A | Discharge status: Home / Self Care | Payer: 59 | Source: Ambulatory Visit | Attending: Radiology | Admitting: Radiology

## 2016-11-08 DIAGNOSIS — N644 Mastodynia: Secondary | ICD-10-CM

## 2017-03-17 ENCOUNTER — Ambulatory Visit: Payer: BLUE CROSS/BLUE SHIELD | Admitting: Family Medicine

## 2017-03-17 ENCOUNTER — Other Ambulatory Visit: Payer: Self-pay

## 2017-03-17 ENCOUNTER — Encounter: Payer: Self-pay | Admitting: Family Medicine

## 2017-03-17 VITALS — BP 137/84 | HR 101 | Temp 99.6°F | Resp 17 | Ht 65.0 in | Wt 276.4 lb

## 2017-03-17 DIAGNOSIS — R7303 Prediabetes: Secondary | ICD-10-CM | POA: Diagnosis not present

## 2017-03-17 DIAGNOSIS — I10 Essential (primary) hypertension: Secondary | ICD-10-CM

## 2017-03-17 DIAGNOSIS — Z23 Encounter for immunization: Secondary | ICD-10-CM | POA: Diagnosis not present

## 2017-03-17 NOTE — Progress Notes (Signed)
Chief Complaint  Patient presents with  . New Patient (Initial Visit)    establish care. Concerns elevated blood pressure    HPI   Hypertension: Patient here for follow-up of elevated blood pressure.  She is exercising and is adherent to low salt diet.  Blood pressure is well controlled at home. Cardiac symptoms none. Patient denies chest pain, chest pressure/discomfort, claudication, exertional chest pressure/discomfort, irregular heart beat, lower extremity edema and near-syncope.  Cardiovascular risk factors: family history of premature cardiovascular disease, hypertension and sedentary lifestyle. Use of agents associated with hypertension: none. History of target organ damage: none. BP Readings from Last 3 Encounters:  03/17/17 137/84  10/30/16 (!) 146/64  08/25/16 123/75   She has been diagnosed with hypertension 10 years now She was doing well with the diovan- hctz but was switched to irbesartan-hctz She sticks to a low sodium plan She does not do regular exercise   Morbid obesity Body mass index is 46 kg/m. She reports that she works at [employer] and she is trying to go on the treadmill after work for walking  [employer] from Last 3 Encounters:  03/17/17 276 lb 6.4 oz (125.4 kg)  09/26/12 229 lb (103.9 kg)  07/14/11 209 lb 6.4 oz (95 kg)    Prediabetes She reports that she was prediabetic She states that she had labs last year June 2018 She is wondering if she can take a Bee pills     Past Medical History:  Diagnosis Date  . Depression   . Hypertension     Current Outpatient Medications  Medication Sig Dispense Refill  . albuterol (PROVENTIL HFA;VENTOLIN HFA) 108 (90 BASE) MCG/ACT inhaler Inhale 2 puffs into the lungs every 4 (four) hours as needed for wheezing. 1 Inhaler 0  . Aspirin-Salicylamide-Caffeine (BC HEADACHE POWDER PO) Take 1 packet by mouth daily as needed (for headache).    . dicyclomine (BENTYL) 10 MG capsule Take 10 mg by mouth 2 (two) times  daily with a meal. 30 prior to meal    . fluticasone (FLONASE) 50 MCG/ACT nasal spray Place 2 sprays into both nostrils 2 (two) times daily. Decrease to 2 sprays/nostril daily after 5 days 16 g 2  . Hydrocodone-Acetaminophen (VICODIN) 5-300 MG TABS Take 1 tablet by mouth 4 (four) times daily as needed. 30 each 0  . ibuprofen (ADVIL,MOTRIN) 200 MG tablet Take 4 tablets (800 mg total) by mouth every 6 (six) hours as needed for pain. 30 tablet 4  . phenazopyridine (PYRIDIUM) 200 MG tablet Take 1 tablet (200 mg total) by mouth 3 (three) times daily. 6 tablet 0  . predniSONE (DELTASONE) 10 MG tablet 4 tabs PO QD for 4 days; 3 tabs PO QD for 3 days; 2 tabs PO QD for 2 days; 1 tab PO QD for 1 day 30 tablet 0  . sertraline (ZOLOFT) 50 MG tablet Take 50 mg by mouth daily.    . Tetrahydrozoline HCl (VISINE OP) Apply 1 drop to eye as needed (for allergies).    . valsartan-hydrochlorothiazide (DIOVAN-HCT) 160-12.5 MG tablet Take 1 tablet by mouth daily.    . cephALEXin (KEFLEX) 500 MG capsule Take 1 capsule (500 mg total) by mouth 4 (four) times daily. (Patient not taking: Reported on 03/17/2017) 20 capsule 0  . doxycycline (VIBRAMYCIN) 100 MG capsule Take 1 capsule (100 mg total) by mouth 2 (two) times daily. (Patient not taking: Reported on 03/17/2017) 20 capsule 0  . hydrochlorothiazide (HYDRODIURIL) 25 MG tablet Take 25 mg by mouth daily.  No current facility-administered medications for this visit.     Allergies: No Known Allergies  Past Surgical History:  Procedure Laterality Date  . BREAST BIOPSY Right   . COLONOSCOPY  07/13/2011   Procedure: COLONOSCOPY;  Surgeon: Gatha Mayer, MD;  Location: [place] [facility];  Service: Endoscopy;  Laterality: N/A;  . DILATION AND CURETTAGE OF UTERUS  abortion  . DILITATION & CURRETTAGE/HYSTROSCOPY WITH NOVASURE ABLATION N/A 09/28/2012   Procedure: DILATATION & CURETTAGE/HYSTEROSCOPY WITH NOVASURE ABLATION; And Resectoscope;  Surgeon: Marvene Staff, MD;   Location: [facility];  Service: Gynecology;  Laterality: N/A;  . ESOPHAGOGASTRODUODENOSCOPY  07/13/2011   Procedure: ESOPHAGOGASTRODUODENOSCOPY (EGD);  Surgeon: Gatha Mayer, MD;  Location: [facility] ENDOSCOPY;  Service: Endoscopy;  Laterality: N/A;    Social History   Socioeconomic History  . Marital status: Single    Spouse name: None  . Number of children: None  . Years of education: None  . Highest education level: None  Social Needs  . Financial resource strain: None  . Food insecurity - worry: None  . Food insecurity - inability: None  . Transportation needs - medical: None  . Transportation needs - non-medical: None  Occupational History  . None  Tobacco Use  . Smoking status: Never Smoker  . Smokeless tobacco: Never Used  Substance and Sexual Activity  . Alcohol use: Yes    Alcohol/week: 1.8 oz    Types: 3 Cans of beer per week    Comment: twice a week  . Drug use: No  . Sexual activity: Yes    Birth control/protection: Condom  Other Topics Concern  . None  Social History Narrative  . None    Family History  Problem Relation Age of Onset  . Breast cancer Sister      ROS Review of Systems See HPI Constitution: No fevers or chills No malaise No diaphoresis Skin: No rash or itching Eyes: no blurry vision, no double vision GU: no dysuria or hematuria Neuro: no dizziness or headaches * all others reviewed and negative   Objective: Vitals:   03/17/17 1551  BP: 137/84  Pulse: (!) 101  Resp: 17  Temp: 99.6 F (37.6 C)  TempSrc: Oral  SpO2: 96%  Weight: 276 lb 6.4 oz (125.4 kg)  Height: 5\' 5"  (1.651 m)    Physical Exam  Constitutional: She is oriented to person, place, and time. She appears well-developed and well-nourished.  HENT:  Head: Normocephalic and atraumatic.  Eyes: Conjunctivae and EOM are normal.  Cardiovascular: Normal rate, regular rhythm, normal heart sounds and intact distal pulses.  No murmur heard. Pulmonary/Chest: Effort normal and  breath sounds normal. No stridor. No respiratory distress. She has no wheezes.  Musculoskeletal: Normal range of motion. She exhibits no edema.  Neurological: She is alert and oriented to person, place, and time.  Skin: Capillary refill takes less than 2 seconds.  Psychiatric: She has a normal mood and affect. Her behavior is normal. Judgment and thought content normal.     Assessment and Plan Cathy Parrish was seen today for new patient (initial visit).  Diagnoses and all orders for this visit:  Essential hypertension- bp controlled with current meds  Prediabetes- discussed ways to prevent progression to diabetes -     Hemoglobin A1c -     Basic metabolic panel  Morbid obesity ([place])- advised pt to start with goal setting, discussed meal planning Also discussed that she should avoid grazing behaviors Discussed exercise strategies such as working out 10 minutes daily every  day  Flu vaccine need -     Flu Vaccine QUAD 36+ mos IM  Other orders -     Cancel: Flu Vaccine QUAD 36+ mos IM -     Cancel: Tdap vaccine greater than or equal to 7yo IM   Follow up for weight check in one month          [place]

## 2017-03-17 NOTE — Patient Instructions (Addendum)
1. Goal of walking for exercise 10 minutes every day  2. Decrease soda to once a week 3. Pick a super food to add each week: - brussel sprouts or cabbage or kale or turnip greens - blueberries, blackberries, strawberries - sweet potato (baked), butternut squash   IF you received an x-ray today, you will receive an invoice from [facility]. Please contact [facility] at [phone] with questions or concerns regarding your invoice.   IF you received labwork today, you will receive an invoice from [facility]. Please contact [facility] at [phone] with questions or concerns regarding your invoice.   Our billing staff will not be able to assist you with questions regarding bills from these companies.  You will be contacted with the lab results as soon as they are available. The fastest way to get your results is to activate your My Chart account. Instructions are located on the last page of this paperwork. If you have not heard from [place] regarding the results in 2 weeks, please contact this office.     Prediabetes Eating Plan Prediabetes-also called impaired glucose tolerance or impaired fasting glucose-is a condition that causes blood sugar (blood glucose) levels to be higher than normal. Following a healthy diet can help to keep prediabetes under control. It can also help to lower the risk of type 2 diabetes and heart disease, which are increased in people who have prediabetes. Along with regular exercise, a healthy diet:  Promotes weight loss.  Helps to control blood sugar levels.  Helps to improve the way that the body uses insulin.  What do I need to know about this eating plan?  Use the glycemic index (GI) to plan your meals. The index tells you how quickly a food will raise your blood sugar. Choose low-GI foods. These foods take a longer time to raise blood sugar.  Pay close attention to the amount of carbohydrates in the food that you eat. Carbohydrates increase  blood sugar levels.  Keep track of how many calories you take in. Eating the right amount of calories will help you to achieve a healthy weight. Losing about 7 percent of your starting weight can help to prevent type 2 diabetes.  You may want to follow a Mediterranean diet. This diet includes a lot of vegetables, lean meats or fish, whole grains, fruits, and healthy oils and fats. What foods can I eat? Grains Whole grains, such as whole-wheat or whole-grain breads, crackers, cereals, and pasta. Unsweetened oatmeal. Bulgur. Barley. Quinoa. Brown rice. Corn or whole-wheat flour tortillas or taco shells. Vegetables Lettuce. Spinach. Peas. Beets. Cauliflower. Cabbage. Broccoli. Carrots. Tomatoes. Squash. Eggplant. Herbs. Peppers. Onions. Cucumbers. Brussels sprouts. Fruits Berries. Bananas. Apples. Oranges. Grapes. Papaya. Mango. Pomegranate. Kiwi. Grapefruit. Cherries. Meats and Other Protein Sources Seafood. Lean meats, such as chicken and [place] or lean cuts of pork and beef. Tofu. Eggs. Nuts. Beans. Dairy Low-fat or fat-free dairy products, such as yogurt, cottage cheese, and cheese. Beverages Water. Tea. Coffee. Sugar-free or diet soda. Seltzer water. Milk. Milk alternatives, such as soy or almond milk. Condiments Mustard. Relish. Low-fat, low-sugar ketchup. Low-fat, low-sugar barbecue sauce. Low-fat or fat-free mayonnaise. Sweets and Desserts Sugar-free or low-fat pudding. Sugar-free or low-fat ice cream and other frozen treats. Fats and Oils Avocado. Walnuts. Olive oil. The items listed above may not be a complete list of recommended foods or beverages. Contact your dietitian for more options. What foods are not recommended? Grains Refined white flour and flour products, such as bread, pasta, snack foods,  and cereals. Beverages Sweetened drinks, such as sweet iced tea and soda. Sweets and Desserts Baked goods, such as cake, cupcakes, pastries, cookies, and cheesecake. The items  listed above may not be a complete list of foods and beverages to avoid. Contact your dietitian for more information. This information is not intended to replace advice given to you by your health care provider. Make sure you discuss any questions you have with your health care provider. Document Released: 07/02/2014 Document Revised: 07/24/2015 Document Reviewed: 03/13/2014 Elsevier Interactive Patient Education  2017 [employer].

## 2017-03-18 LAB — HEMOGLOBIN A1C
ESTIMATED AVERAGE GLUCOSE: 140 mg/dL
HEMOGLOBIN A1C: 6.5 % — AB (ref 4.8–5.6)

## 2017-03-18 LAB — BASIC METABOLIC PANEL
BUN / CREAT RATIO: 15 (ref 9–23)
BUN: 12 mg/dL (ref 6–24)
CHLORIDE: 100 mmol/L (ref 96–106)
CO2: 24 mmol/L (ref 20–29)
Calcium: 9.7 mg/dL (ref 8.7–10.2)
Creatinine, Ser: 0.79 mg/dL (ref 0.57–1.00)
GFR calc Af Amer: 102 mL/min/{1.73_m2} (ref >59)
GFR calc non Af Amer: 89 mL/min/{1.73_m2} (ref >59)
GLUCOSE: 93 mg/dL (ref 65–99)
POTASSIUM: 4.4 mmol/L (ref 3.5–5.2)
SODIUM: 141 mmol/L (ref 134–144)

## 2017-03-21 ENCOUNTER — Telehealth: Payer: Self-pay | Admitting: Family Medicine

## 2017-03-21 NOTE — Telephone Encounter (Signed)
Copied from [place]. Topic: Quick Communication - See Telephone Encounter >> Mar 21, 2017  9:36 AM Synthia Innocent wrote: CRM for notification. See Telephone encounter for: Patient states that she saw provider on 03/17/17, states she was suppose to call her BP med, irbesartan in and some other new one. Please advise. [facility] on [address]  03/21/17.

## 2017-03-23 NOTE — Telephone Encounter (Signed)
Please advise 

## 2017-03-29 NOTE — Telephone Encounter (Signed)
Can you send in pt irbesatan Rx, See messages below.

## 2017-03-29 NOTE — Telephone Encounter (Signed)
Pt is calling back in regards to getting this medication sent in. She has not taken her BP medication in a month now and is getting a little worried. Please advise.   [facility] on [address].

## 2017-03-30 MED ORDER — VALSARTAN-HYDROCHLOROTHIAZIDE 160-12.5 MG PO TABS: 1.0000 | ORAL_TABLET | Freq: Every day | ORAL | 1 refills | Status: DC

## 2017-03-30 MED ORDER — IRBESARTAN-HYDROCHLOROTHIAZIDE 150-12.5 MG PO TABS: 1.0000 | ORAL_TABLET | Freq: Every day | ORAL | 1 refills | Status: DC

## 2017-03-30 NOTE — Addendum Note (Signed)
Addended by: Delia Chimes A on: 03/30/2017 04:56 PM   Modules accepted: Orders

## 2017-04-21 ENCOUNTER — Ambulatory Visit: Payer: BLUE CROSS/BLUE SHIELD | Admitting: Family Medicine

## 2017-05-19 ENCOUNTER — Ambulatory Visit (HOSPITAL_COMMUNITY)
Admission: EM | Admit: 2017-05-19 | Discharge: 2017-05-19 | Disposition: A | Discharge status: Home / Self Care | Payer: BLUE CROSS/BLUE SHIELD | Attending: Family Medicine | Admitting: Family Medicine

## 2017-05-19 ENCOUNTER — Encounter (HOSPITAL_COMMUNITY): Payer: Self-pay | Admitting: Family Medicine

## 2017-05-19 DIAGNOSIS — H60393 Other infective otitis externa, bilateral: Secondary | ICD-10-CM

## 2017-05-19 MED ORDER — NEOMYCIN-POLYMYXIN-HC 3.5-10000-1 OT SUSP: 4.0000 [drp] | Freq: Three times a day (TID) | OTIC | 0 refills | Status: DC

## 2017-05-19 MED ORDER — FLUCONAZOLE 200 MG PO TABS: 200.0000 mg | ORAL_TABLET | Freq: Once | ORAL | 0 refills | Status: AC

## 2017-05-19 NOTE — ED Provider Notes (Signed)
[facility]    CSN: [id number] Arrival date & time: 05/19/17  1750     History   Chief Complaint Chief Complaint  Patient presents with  . Ear Pain    HPI COSTELLA SCHWARZ is a 47 y.o. female.   47 year old female, with history of hypertension, depression, presenting today for bilateral ear pain.  Symptoms been ongoing for the past 2 weeks.  States that she took some amoxicillin that she had left over from a previous procedure for the past week but developed a yeast infection so she stopped.  States that she has also been using over-the-counter earache drops without much relief.  States that she has been scratching the inside of her ears with her fingernails as well as body pains.  No fever or chills.  The history is provided by the patient.  Otalgia  Location:  Bilateral Behind ear:  No abnormality Quality:  Aching Severity:  Mild Onset quality:  Gradual Duration:  1 week Timing:  Constant Progression:  Worsening Chronicity:  New Context: not direct blow, not elevation change, not foreign body in ear, not recent URI and not water in ear   Relieved by:  Nothing Worsened by:  Nothing Ineffective treatments:  OTC medications Associated symptoms: no abdominal pain, no congestion, no cough, no diarrhea, no ear discharge, no fever, no headaches, no rash, no rhinorrhea, no sore throat, no tinnitus and no vomiting   Risk factors: no recent travel, no chronic ear infection and no prior ear surgery     Past Medical History:  Diagnosis Date  . Depression   . Hypertension     Patient Active Problem List   Diagnosis Date Noted  . B12 deficiency 07/12/2011  . NSAID long-term use-[place] 07/12/2011  . Weakness 07/11/2011  . Iron deficiency anemia 07/11/2011  . Hypertension     Past Surgical History:  Procedure Laterality Date  . BREAST BIOPSY Right   . COLONOSCOPY  07/13/2011   Procedure: COLONOSCOPY;  Surgeon: Gatha Mayer, MD;  Location: [place];  Service:  Endoscopy;  Laterality: N/A;  . DILATION AND CURETTAGE OF UTERUS  abortion  . DILITATION & CURRETTAGE/HYSTROSCOPY WITH NOVASURE ABLATION N/A 09/28/2012   Procedure: DILATATION & CURETTAGE/HYSTEROSCOPY WITH NOVASURE ABLATION; And Resectoscope;  Surgeon: Marvene Staff, MD;  Location: [facility];  Service: Gynecology;  Laterality: N/A;  . ESOPHAGOGASTRODUODENOSCOPY  07/13/2011   Procedure: ESOPHAGOGASTRODUODENOSCOPY (EGD);  Surgeon: Gatha Mayer, MD;  Location: [facility] ENDOSCOPY;  Service: Endoscopy;  Laterality: N/A;    OB History   None      Home Medications    Prior to Admission medications   Medication Sig Start Date End Date Taking? Authorizing Provider  albuterol (PROVENTIL HFA;VENTOLIN HFA) 108 (90 BASE) MCG/ACT inhaler Inhale 2 puffs into the lungs every 4 (four) hours as needed for wheezing. 07/03/14   Baker, Freeman Caldron, PA-C  Aspirin-Salicylamide-Caffeine (BC HEADACHE POWDER PO) Take 1 packet by mouth daily as needed (for headache).    [provider]  dicyclomine (BENTYL) 10 MG capsule Take 10 mg by mouth 2 (two) times daily with a meal. 30 prior to meal    [provider]  fluconazole (DIFLUCAN) 200 MG tablet Take 1 tablet (200 mg total) by mouth once for 1 dose. 05/19/17 05/19/17  Blue, Olivia C, PA-C  fluticasone (FLONASE) 50 MCG/ACT nasal spray Place 2 sprays into both nostrils 2 (two) times daily. Decrease to 2 sprays/nostril daily after 5 days 07/03/14   Liam Graham, PA-C  hydrochlorothiazide (HYDRODIURIL) 25 MG tablet Take 25 mg by mouth daily.    [provider]  ibuprofen (ADVIL,MOTRIN) 200 MG tablet Take 4 tablets (800 mg total) by mouth every 6 (six) hours as needed for pain. 09/28/12   Servando Salina, MD  irbesartan-hydrochlorothiazide (AVALIDE) 150-12.5 MG tablet Take 1 tablet by mouth daily. 03/30/17   Forrest Moron, MD  neomycin-polymyxin-hydrocortisone (CORTISPORIN) 3.5-10000-1 OTIC suspension Place 4 drops into both ears 3 (three) times  daily. 05/19/17   Blue, Olivia C, PA-C  phenazopyridine (PYRIDIUM) 200 MG tablet Take 1 tablet (200 mg total) by mouth 3 (three) times daily. 08/25/16   Lysbeth Penner, FNP  sertraline (ZOLOFT) 50 MG tablet Take 50 mg by mouth daily.    [provider]  Tetrahydrozoline HCl (VISINE OP) Apply 1 drop to eye as needed (for allergies).    [provider]    Family History Family History  Problem Relation Age of Onset  . Breast cancer Sister     Social History Social History   Tobacco Use  . Smoking status: Never Smoker  . Smokeless tobacco: Never Used  Substance Use Topics  . Alcohol use: Yes    Alcohol/week: 1.8 oz    Types: 3 Cans of beer per week    Comment: twice a week  . Drug use: No     Allergies   Patient has no known allergies.   Review of Systems Review of Systems  Constitutional: Negative for chills and fever.  HENT: Positive for ear pain. Negative for congestion, ear discharge, rhinorrhea, sore throat and tinnitus.   Eyes: Negative for pain and visual disturbance.  Respiratory: Negative for cough and shortness of breath.   Cardiovascular: Negative for chest pain and palpitations.  Gastrointestinal: Negative for abdominal pain, diarrhea and vomiting.  Genitourinary: Negative for dysuria and hematuria.  Musculoskeletal: Negative for arthralgias and back pain.  Skin: Negative for color change and rash.  Neurological: Negative for seizures, syncope and headaches.  All other systems reviewed and are negative.    Physical Exam Triage Vital Signs ED Triage Vitals  Enc Vitals Group     BP 05/19/17 1841 133/80     Pulse Rate 05/19/17 1841 84     Resp 05/19/17 1841 18     Temp 05/19/17 1841 97.7 F (36.5 C)     Temp Source 05/19/17 1841 Oral     SpO2 05/19/17 1841 98 %     Weight --      Height --      Head Circumference --      [place] --      Pain Score 05/19/17 1838 10     Pain Loc --      Pain Edu? --      Excl. in [place]? --    No  data found.  Updated Vital Signs BP 133/80   Pulse 84   Temp 97.7 F (36.5 C) (Oral)   Resp 18   SpO2 98%   Visual Acuity Right Eye Distance:   Left Eye Distance:   Bilateral Distance:    Right Eye Near:   Left Eye Near:    Bilateral Near:     Physical Exam  Constitutional: She appears well-developed and well-nourished. No distress.  HENT:  Head: Normocephalic and atraumatic.  Right Ear: Tympanic membrane normal.  Left Ear: Tympanic membrane normal.  Edema of the external canals bilaterally  Eyes: Conjunctivae are normal.  Neck: Neck supple.  Cardiovascular: Normal rate and  regular rhythm.  No murmur heard. Pulmonary/Chest: Effort normal and breath sounds normal. No respiratory distress.  Abdominal: Soft. There is no tenderness.  Musculoskeletal: She exhibits no edema.  Neurological: She is alert.  Skin: Skin is warm and dry.  Psychiatric: She has a normal mood and affect.  Nursing note and vitals reviewed.    UC Treatments / Results  Labs (all labs ordered are listed, but only abnormal results are displayed) Labs Reviewed - No data to display  EKG  EKG Interpretation None       Radiology No results found.  Procedures Procedures (including critical care time)  Medications Ordered in UC Medications - No data to display   Initial Impression / Assessment and Plan / UC Course  I have reviewed the triage vital signs and the nursing notes.  Pertinent labs & imaging results that were available during my care of the patient were reviewed by me and considered in my medical decision making (see chart for details).     Otitis externa bilaterally.  Will treat with Cortisporin.  Patient also given a Diflucan for vaginal candidiasis  Final Clinical Impressions(s) / UC Diagnoses   Final diagnoses:  Infective otitis externa of both ears    ED Discharge Orders        Ordered    neomycin-polymyxin-hydrocortisone (CORTISPORIN) 3.5-10000-1 OTIC suspension  3  times daily     05/19/17 1900    fluconazole (DIFLUCAN) 200 MG tablet   Once     05/19/17 1900       Controlled Substance Prescriptions [place] Controlled Substance Registry consulted? Not Applicable   Oren Section 05/19/17 1902

## 2017-05-19 NOTE — ED Notes (Signed)
Patient discharged by provider.

## 2017-05-19 NOTE — ED Triage Notes (Signed)
Pt here for 2 weeks of ear pain. Reports also some dizziness and headaches.

## 2017-07-22 ENCOUNTER — Emergency Department (HOSPITAL_COMMUNITY): Payer: BLUE CROSS/BLUE SHIELD

## 2017-07-22 ENCOUNTER — Encounter (HOSPITAL_COMMUNITY): Payer: Self-pay | Admitting: Emergency Medicine

## 2017-07-22 ENCOUNTER — Emergency Department (HOSPITAL_COMMUNITY)
Admission: EM | Admit: 2017-07-22 | Discharge: 2017-07-22 | Disposition: A | Discharge status: Home / Self Care | Payer: BLUE CROSS/BLUE SHIELD | Attending: Emergency Medicine | Admitting: Emergency Medicine

## 2017-07-22 DIAGNOSIS — S93401A Sprain of unspecified ligament of right ankle, initial encounter: Secondary | ICD-10-CM

## 2017-07-22 DIAGNOSIS — Y929 Unspecified place or not applicable: Secondary | ICD-10-CM | POA: Insufficient documentation

## 2017-07-22 DIAGNOSIS — X500XXA Overexertion from strenuous movement or load, initial encounter: Secondary | ICD-10-CM | POA: Diagnosis not present

## 2017-07-22 DIAGNOSIS — M25571 Pain in right ankle and joints of right foot: Secondary | ICD-10-CM | POA: Diagnosis not present

## 2017-07-22 DIAGNOSIS — Y999 Unspecified external cause status: Secondary | ICD-10-CM | POA: Insufficient documentation

## 2017-07-22 DIAGNOSIS — Y9301 Activity, walking, marching and hiking: Secondary | ICD-10-CM | POA: Diagnosis not present

## 2017-07-22 DIAGNOSIS — S99921A Unspecified injury of right foot, initial encounter: Secondary | ICD-10-CM | POA: Diagnosis not present

## 2017-07-22 DIAGNOSIS — S99911A Unspecified injury of right ankle, initial encounter: Secondary | ICD-10-CM | POA: Diagnosis not present

## 2017-07-22 HISTORY — DX: Unspecified asthma, uncomplicated: J45.909

## 2017-07-22 MED ORDER — IBUPROFEN 200 MG PO TABS
400.0000 mg | ORAL_TABLET | Freq: Once | ORAL | Status: AC
  Administered 2017-07-22: 400 mg via ORAL
  Filled 2017-07-22: qty 2

## 2017-07-22 NOTE — ED Triage Notes (Signed)
Pt reports that she fell stepping off porch yesterday. Pt has swelling to right ankle.

## 2017-07-22 NOTE — ED Provider Notes (Signed)
[facility] DEPT Provider Note   CSN: [id number] Arrival date & time: 07/22/17  1050     History   Chief Complaint Chief Complaint  Patient presents with  . Ankle Pain    HPI Cathy Parrish is a 47 y.o. female.  HPI   Patient is a 47 year old female with history of asthma, depression, hypertension who presents emergency department today complaining of right ankle pain that began suddenly yesterday.  She states she was walking down a step on her porch yesterday when she rolled her ankle.  She had sudden onset of right ankle pain worse to the lateral aspect of the ankle.  Is severe in nature and is constant.  Is worse with weightbearing.  She has been somewhat ambulatory at home however is having significant pain with this.  She denies any other injuries from the fall.  Denies any numbness or weakness to the foot.  She is tried taking ibuprofen which she states improves her pain somewhat.  She also tried icing as well.  Past Medical History:  Diagnosis Date  . Asthma   . Depression   . Hypertension     Patient Active Problem List   Diagnosis Date Noted  . B12 deficiency 07/12/2011  . NSAID long-term use-[facility] 07/12/2011  . Weakness 07/11/2011  . Iron deficiency anemia 07/11/2011  . Hypertension     Past Surgical History:  Procedure Laterality Date  . BREAST BIOPSY Right   . COLONOSCOPY  07/13/2011   Procedure: COLONOSCOPY;  Surgeon: Gatha Mayer, MD;  Location: [place];  Service: Endoscopy;  Laterality: N/A;  . DILATION AND CURETTAGE OF UTERUS  abortion  . DILITATION & CURRETTAGE/HYSTROSCOPY WITH NOVASURE ABLATION N/A 09/28/2012   Procedure: DILATATION & CURETTAGE/HYSTEROSCOPY WITH NOVASURE ABLATION; And Resectoscope;  Surgeon: Marvene Staff, MD;  Location: [facility];  Service: Gynecology;  Laterality: N/A;  . ESOPHAGOGASTRODUODENOSCOPY  07/13/2011   Procedure: ESOPHAGOGASTRODUODENOSCOPY (EGD);  Surgeon: Gatha Mayer, MD;   Location: [facility] ENDOSCOPY;  Service: Endoscopy;  Laterality: N/A;     OB History   None      Home Medications    Prior to Admission medications   Medication Sig Start Date End Date Taking? Authorizing Provider  albuterol (PROVENTIL HFA;VENTOLIN HFA) 108 (90 BASE) MCG/ACT inhaler Inhale 2 puffs into the lungs every 4 (four) hours as needed for wheezing. 07/03/14  Yes Baker, Freeman Caldron, PA-C  Aspirin-Salicylamide-Caffeine (BC HEADACHE POWDER PO) Take 1 packet by mouth daily as needed (for headache).   Yes [provider]  dicyclomine (BENTYL) 10 MG capsule Take 10 mg by mouth 2 (two) times daily with a meal. 30 prior to meal   Yes [provider]  hydrochlorothiazide (HYDRODIURIL) 25 MG tablet Take 25 mg by mouth daily.   Yes [provider]  ibuprofen (ADVIL,MOTRIN) 200 MG tablet Take 4 tablets (800 mg total) by mouth every 6 (six) hours as needed for pain. 09/28/12  Yes Servando Salina, MD  irbesartan-hydrochlorothiazide (AVALIDE) 150-12.5 MG tablet Take 1 tablet by mouth daily. 03/30/17  Yes Stallings, Zoe A, MD  sertraline (ZOLOFT) 50 MG tablet Take 50 mg by mouth daily.   Yes [provider]  Tetrahydrozoline HCl (VISINE OP) Apply 1 drop to eye as needed (for allergies).   Yes [provider]  fluticasone (FLONASE) 50 MCG/ACT nasal spray Place 2 sprays into both nostrils 2 (two) times daily. Decrease to 2 sprays/nostril daily after 5 days Patient not taking: Reported on 07/22/2017 07/03/14  Baker, Freeman Caldron, PA-C  neomycin-polymyxin-hydrocortisone (CORTISPORIN) 3.5-10000-1 OTIC suspension Place 4 drops into both ears 3 (three) times daily. Patient not taking: Reported on 07/22/2017 05/19/17   Camila Li C, PA-C  phenazopyridine (PYRIDIUM) 200 MG tablet Take 1 tablet (200 mg total) by mouth 3 (three) times daily. Patient not taking: Reported on 07/22/2017 08/25/16   Lysbeth Penner, FNP    Family History Family History  Problem Relation Age of  Onset  . Breast cancer Sister     Social History Social History   Tobacco Use  . Smoking status: Never Smoker  . Smokeless tobacco: Never Used  Substance Use Topics  . Alcohol use: Yes    Alcohol/week: 1.8 oz    Types: 3 Cans of beer per week    Comment: twice a week  . Drug use: No     Allergies   Patient has no known allergies.   Review of Systems Review of Systems  Constitutional: Negative for fever.  Eyes: Negative for photophobia.  Respiratory: Negative for shortness of breath.   Cardiovascular: Negative for chest pain.  Gastrointestinal: Negative for abdominal pain.  Genitourinary: Negative for pelvic pain.  Musculoskeletal: Negative for back pain.       Right ankle pain  Skin: Negative for wound.  Neurological: Negative for headaches.     Physical Exam Updated Vital Signs BP (!) 148/88 (BP Location: Left Arm)   Pulse 90   Temp 97.8 F (36.6 C) (Oral)   Resp 16   Ht 5\' 6"  (1.676 m)   Wt 113.4 kg (250 lb)   SpO2 98%   BMI 40.35 kg/m   Physical Exam  Constitutional: She appears well-developed and well-nourished. No distress.  HENT:  Head: Normocephalic and atraumatic.  Eyes: Conjunctivae are normal.  Neck: Neck supple.  Cardiovascular: Normal rate.  Pulmonary/Chest: Effort normal.  Musculoskeletal: Normal range of motion.  Diffuse swelling to the right ankle.  Tenderness to the medial and lateral malleolus, worse to the lateral aspect.  No bony tenderness along the fibula or tibia.  No obvious dislocation or deformity.  There is some tenderness to the midfoot and hindfoot area, worse to the lateral aspect.  Sensation intact.  Brisk cap refill.  DP pulses intact.  5/5 strength with dorsiflexion, plantarflexion, knee flexion, extension.  Able to wiggle toes bilaterally.  Neurological: She is alert.  Skin: Skin is warm and dry.  Psychiatric: She has a normal mood and affect.  Nursing note and vitals reviewed.   ED Treatments / Results  Labs (all  labs ordered are listed, but only abnormal results are displayed) Labs Reviewed - No data to display  EKG None  Radiology Dg Ankle Complete Right  Result Date: 07/22/2017 CLINICAL DATA:  Right ankle pain and swelling after the patient fell off of a porch last night and twisted the right ankle. EXAM: RIGHT ANKLE - COMPLETE 3+ VIEW COMPARISON:  None. FINDINGS: There is no fracture or dislocation. Tiny osteophytes on the distal tibia. Soft tissue swelling around the ankle. Small calcaneal enthesophytes. IMPRESSION: Soft tissue swelling.  No acute bone abnormality. Electronically Signed   By: Lorriane Shire M.D.   On: 07/22/2017 12:56   Dg Foot Complete Right  Result Date: 07/22/2017 CLINICAL DATA:  Twisting injury right foot and ankle due to a fall from a porch yesterday. Initial encounter. EXAM: RIGHT FOOT COMPLETE - 3+ VIEW COMPARISON:  None. FINDINGS: There is no evidence of fracture or dislocation. There is no evidence of arthropathy or  other focal bone abnormality. Small calcaneal spurs are seen. Soft tissues are unremarkable. IMPRESSION: No acute abnormality. Electronically Signed   By: Inge Rise M.D.   On: 07/22/2017 14:18    Procedures Procedures (including critical care time)  Medications Ordered in ED Medications  ibuprofen (ADVIL,MOTRIN) tablet 400 mg (has no administration in time range)     Initial Impression / Assessment and Plan / ED Course  I have reviewed the triage vital signs and the nursing notes.  Pertinent labs & imaging results that were available during my care of the patient were reviewed by me and considered in my medical decision making (see chart for details).   Final Clinical Impressions(s) / ED Diagnoses   Final diagnoses:  Sprain of right ankle, unspecified ligament, initial encounter   Patient presenting with ankle pain after twisting ankle yesterday.  Vital signs stable and patient nontoxic-appearing.  X-ray of right foot and ankle negative for  acute fracture abnormality.  A splint was applied and crutches given.  patient advised to follow-up with PCPin 1 week for reevaluation.  Advised Tylenol, ibuprofen, and rice protocol for pain.  Advised to return to the ER for any new or worsening symptoms in the meantime.  All questions were answered and patient understands plan and reasons to return.  ED Discharge Orders    None       Bishop Dublin 07/22/17 1532    Malvin Johns, MD 07/22/17 1610

## 2017-07-22 NOTE — Discharge Instructions (Signed)

## 2017-09-14 ENCOUNTER — Ambulatory Visit (INDEPENDENT_AMBULATORY_CARE_PROVIDER_SITE_OTHER): Payer: BLUE CROSS/BLUE SHIELD | Admitting: Family Medicine

## 2017-09-14 ENCOUNTER — Encounter: Payer: Self-pay | Admitting: Family Medicine

## 2017-09-14 ENCOUNTER — Other Ambulatory Visit: Payer: Self-pay

## 2017-09-14 VITALS — BP 132/82 | HR 103 | Temp 99.0°F | Resp 16 | Ht 66.0 in | Wt 266.6 lb

## 2017-09-14 DIAGNOSIS — R87612 Low grade squamous intraepithelial lesion on cytologic smear of cervix (LGSIL): Secondary | ICD-10-CM | POA: Diagnosis not present

## 2017-09-14 DIAGNOSIS — Z113 Encounter for screening for infections with a predominantly sexual mode of transmission: Secondary | ICD-10-CM

## 2017-09-14 DIAGNOSIS — Z23 Encounter for immunization: Secondary | ICD-10-CM | POA: Diagnosis not present

## 2017-09-14 DIAGNOSIS — R7303 Prediabetes: Secondary | ICD-10-CM

## 2017-09-14 DIAGNOSIS — Z13220 Encounter for screening for lipoid disorders: Secondary | ICD-10-CM | POA: Diagnosis not present

## 2017-09-14 DIAGNOSIS — Z124 Encounter for screening for malignant neoplasm of cervix: Secondary | ICD-10-CM

## 2017-09-14 DIAGNOSIS — I10 Essential (primary) hypertension: Secondary | ICD-10-CM | POA: Diagnosis not present

## 2017-09-14 DIAGNOSIS — Z0000 Encounter for general adult medical examination without abnormal findings: Secondary | ICD-10-CM

## 2017-09-14 MED ORDER — FLUTICASONE PROPIONATE 50 MCG/ACT NA SUSP: 2.0000 | Freq: Two times a day (BID) | NASAL | 2 refills | Status: DC

## 2017-09-14 NOTE — Progress Notes (Signed)
Chief Complaint  Patient presents with  . Gynecologic Exam  . left ear pain x 3 days, taking some ibuprofen with no relief    Subjective:  Cathy Parrish is a 47 y.o. female here for a health maintenance visit.  Patient is established pt  She has a diagnosis of prediabetes She has not been exercising She reports that she has been working on her diet She reports fatigue but denies history of weight loss, increased hunger or increased thirst   She would also like a pap smear and std screening  Patient Active Problem List   Diagnosis Date Noted  . B12 deficiency 07/12/2011  . NSAID long-term use-[facility] 07/12/2011  . Weakness 07/11/2011  . Iron deficiency anemia 07/11/2011  . Hypertension     Past Medical History:  Diagnosis Date  . Asthma   . Depression   . Hypertension     Past Surgical History:  Procedure Laterality Date  . BREAST BIOPSY Right   . COLONOSCOPY  07/13/2011   Procedure: COLONOSCOPY;  Surgeon: Gatha Mayer, MD;  Location: [place];  Service: Endoscopy;  Laterality: N/A;  . DILATION AND CURETTAGE OF UTERUS  abortion  . DILITATION & CURRETTAGE/HYSTROSCOPY WITH NOVASURE ABLATION N/A 09/28/2012   Procedure: DILATATION & CURETTAGE/HYSTEROSCOPY WITH NOVASURE ABLATION; And Resectoscope;  Surgeon: Marvene Staff, MD;  Location: [facility];  Service: Gynecology;  Laterality: N/A;  . ESOPHAGOGASTRODUODENOSCOPY  07/13/2011   Procedure: ESOPHAGOGASTRODUODENOSCOPY (EGD);  Surgeon: Gatha Mayer, MD;  Location: [facility] ENDOSCOPY;  Service: Endoscopy;  Laterality: N/A;     Outpatient Medications Prior to Visit  Medication Sig Dispense Refill  . albuterol (PROVENTIL HFA;VENTOLIN HFA) 108 (90 BASE) MCG/ACT inhaler Inhale 2 puffs into the lungs every 4 (four) hours as needed for wheezing. 1 Inhaler 0  . Aspirin-Salicylamide-Caffeine (BC HEADACHE POWDER PO) Take 1 packet by mouth daily as needed (for headache).    . dicyclomine (BENTYL) 10 MG capsule Take 10 mg by mouth 2  (two) times daily with a meal. 30 prior to meal    . hydrochlorothiazide (HYDRODIURIL) 25 MG tablet Take 25 mg by mouth daily.    Marland Kitchen ibuprofen (ADVIL,MOTRIN) 200 MG tablet Take 4 tablets (800 mg total) by mouth every 6 (six) hours as needed for pain. 30 tablet 4  . irbesartan-hydrochlorothiazide (AVALIDE) 150-12.5 MG tablet Take 1 tablet by mouth daily. 90 tablet 1  . sertraline (ZOLOFT) 50 MG tablet Take 50 mg by mouth daily.    . Tetrahydrozoline HCl (VISINE OP) Apply 1 drop to eye as needed (for allergies).    . phenazopyridine (PYRIDIUM) 200 MG tablet Take 1 tablet (200 mg total) by mouth 3 (three) times daily. 6 tablet 0  . fluticasone (FLONASE) 50 MCG/ACT nasal spray Place 2 sprays into both nostrils 2 (two) times daily. Decrease to 2 sprays/nostril daily after 5 days (Patient not taking: Reported on 07/22/2017) 16 g 2  . neomycin-polymyxin-hydrocortisone (CORTISPORIN) 3.5-10000-1 OTIC suspension Place 4 drops into both ears 3 (three) times daily. 10 mL 0   No facility-administered medications prior to visit.     No Known Allergies   Family History  Problem Relation Age of Onset  . Breast cancer Sister      Health Habits: Dental Exam: up to date Eye Exam: up to date   Social History   Socioeconomic History  . Marital status: Single    Spouse name: Not on file  . Number of children: Not on file  . Years of education: Not  on file  . Highest education level: Not on file  Occupational History  . Not on file  Social Needs  . Financial resource strain: Not on file  . Food insecurity:    Worry: Not on file    Inability: Not on file  . Transportation needs:    Medical: Not on file    Non-medical: Not on file  Tobacco Use  . Smoking status: Never Smoker  . Smokeless tobacco: Never Used  Substance and Sexual Activity  . Alcohol use: Yes    Alcohol/week: 1.8 oz    Types: 3 Cans of beer per week    Comment: twice a week  . Drug use: No  . Sexual activity: Yes    Birth  control/protection: Condom  Lifestyle  . Physical activity:    Days per week: Not on file    Minutes per session: Not on file  . Stress: Not on file  Relationships  . Social connections:    Talks on phone: Not on file    Gets together: Not on file    Attends religious service: Not on file    Active member of club or organization: Not on file    Attends meetings of clubs or organizations: Not on file    Relationship status: Not on file  . Intimate partner violence:    Fear of current or ex partner: Not on file    Emotionally abused: Not on file    Physically abused: Not on file    Forced sexual activity: Not on file  Other Topics Concern  . Not on file  Social History Narrative  . Not on file   Social History   Substance and Sexual Activity  Alcohol Use Yes  . Alcohol/week: 1.8 oz  . Types: 3 Cans of beer per week   Comment: twice a week   Social History   Tobacco Use  Smoking Status Never Smoker  Smokeless Tobacco Never Used   Social History   Substance and Sexual Activity  Drug Use No    GYN: Sexual Health Menstrual status: regular menses LMP: No LMP recorded. Patient has had an ablation. Last pap smear: see HM section History of abnormal pap smears:   Health Maintenance: See under health Maintenance activity for review of completion dates as well. Immunization History  Administered Date(s) Administered  . Influenza,inj,Quad PF,6+ Mos 03/17/2017  . Tdap 09/14/2017      Depression Screen-PHQ2/9 Depression screen [facility] 2/9 09/15/2017 09/14/2017 09/14/2017 03/17/2017  Decreased Interest 0 0 0 0  Down, Depressed, Hopeless 0 0 0 0  PHQ - 2 Score 0 0 0 0       Depression Severity and Treatment Recommendations:  0-4= None  5-9= Mild / Treatment: Support, educate to call if worse; return in one month  10-14= Moderate / Treatment: Support, watchful waiting; Antidepressant or Psycotherapy  15-19= Moderately severe / Treatment: Antidepressant OR Psychotherapy   >= 20 = Major depression, severe / Antidepressant AND Psychotherapy    Review of Systems   Review of Systems  Constitutional: Negative for chills and fever.  Cardiovascular: Negative for chest pain and palpitations.  Gastrointestinal: Negative for abdominal pain, nausea and vomiting.  Neurological: Negative for dizziness and headaches.  Psychiatric/Behavioral: Negative for depression. The patient is not nervous/anxious.     See HPI for ROS as well.    Objective:   Vitals:   09/14/17 1527  BP: 132/82  Pulse: (!) 103  Resp: 16  Temp: 99 F (37.2  C)  TempSrc: Oral  SpO2: 96%  Weight: 266 lb 9.6 oz (120.9 kg)  Height: 5\' 6"  (1.676 m)    Body mass index is 43.03 kg/m.  Physical Exam  Physical Exam  Constitutional: He is oriented to person, place, and time. He appears well-developed and well-nourished.  HENT:  Head: Normocephalic and atraumatic.  Eyes: Conjunctivae and EOM are normal.  Cardiovascular: Normal rate, regular rhythm, normal heart sounds and intact distal pulses.  No murmur heard. Pulmonary/Chest: Effort normal and breath sounds normal. No stridor. No respiratory distress. He has no wheezes.  Neurological: He is alert and oriented to person, place, and time.  Skin: Skin is warm. Capillary refill takes less than 2 seconds.  Psychiatric: He has a normal mood and affect. His behavior is normal. Judgment and thought content normal.   GU exam- Chaperone Present Labia normal bilaterally without skin lesions Urethral meatus normal appearing without erythema Vagina withOUT discharge No CMT, ovaries small and not palpable Uterus midline, nontender Pap smear performed   Assessment/Plan:   Patient was seen for a health maintenance exam.  Counseled the patient on health maintenance issues. Reviewed her health mainteance schedule and ordered appropriate tests (see orders.) Counseled on regular exercise and weight management. Recommend regular eye exams and dental  cleaning.   The following issues were addressed today for health maintenance:   Cathy Parrish was seen today for gynecologic exam and left ear pain x 3 days, taking some ibuprofen with no relief.  Diagnoses and all orders for this visit:  Encounter for health maintenance examination in adult  Encounter for Papanicolaou smear for cervical cancer screening -     Pap IG, CT/NG NAA, and HPV (high risk) [facility]  Screening, lipid -     Lipid panel  Essential hypertension -     Lipid panel -     Comprehensive metabolic panel  Prediabetes -     Lipid panel -     Hemoglobin A1c -     Comprehensive metabolic panel  Morbid obesity (HCC) -     Lipid panel -     Hemoglobin A1c -     Comprehensive metabolic panel  Need for Tdap vaccination -     Tdap vaccine greater than or equal to 49yo IM  Screen for STD (sexually transmitted disease) -     HIV antibody -     Hepatitis B surface antigen -     RPR  Other orders -     fluticasone (FLONASE) 50 MCG/ACT nasal spray; Place 2 sprays into both nostrils 2 (two) times daily. Decrease to 2 sprays/nostril daily after 5 days    No follow-ups on file.    Body mass index is 43.03 kg/m.:  Discussed the patient's BMI with patient. The BMI body mass index is 43.03 kg/m.     No future appointments.  Patient Instructions   Chair Cardio Exercises Caroline Martinique  [email or web address]   IF you received an x-ray today, you will receive an invoice from [facility]. Please contact [facility] at [phone] with questions or concerns regarding your invoice.   IF you received labwork today, you will receive an invoice from [facility]. Please contact [facility] at [phone] with questions or concerns regarding your invoice.   Our billing staff will not be able to assist you with questions regarding bills from these companies.  You will be contacted with the lab results as soon as they are available. The fastest  way to get your results is  to activate your My Chart account. Instructions are located on the last page of this paperwork. If you have not heard from [place] regarding the results in 2 weeks, please contact this office.

## 2017-09-14 NOTE — Patient Instructions (Addendum)
Chair Cardio Exercises Caroline Martin[place]  [email or web address]   IF you received an x-ray today, you will receive an invoice from [facility]. Please contact [facility] at [phone] with questions or concerns regarding your invoice.   IF you received labwork today, you will receive an invoice from [place]. Please contact [facility] at [phone] with questions or concerns regarding your invoice.   Our billing staff will not be able to assist you with questions regarding bills from these companies.  You will be contacted with the lab results as soon as they are available. The fastest way to get your results is to activate your My Chart account. Instructions are located on the last page of this paperwork. If you have not heard from [place] regarding the results in 2 weeks, please contact this office.

## 2017-09-15 ENCOUNTER — Other Ambulatory Visit: Payer: Self-pay

## 2017-09-15 ENCOUNTER — Encounter: Payer: Self-pay | Admitting: Physician Assistant

## 2017-09-15 ENCOUNTER — Ambulatory Visit: Payer: BLUE CROSS/BLUE SHIELD | Admitting: Physician Assistant

## 2017-09-15 ENCOUNTER — Telehealth: Payer: Self-pay | Admitting: Family Medicine

## 2017-09-15 VITALS — BP 132/82 | Temp 98.6°F | Ht 66.0 in | Wt 266.0 lb

## 2017-09-15 DIAGNOSIS — H60502 Unspecified acute noninfective otitis externa, left ear: Secondary | ICD-10-CM | POA: Diagnosis not present

## 2017-09-15 LAB — COMPREHENSIVE METABOLIC PANEL
A/G RATIO: 1.5 (ref 1.2–2.2)
ALT: 14 IU/L (ref 0–32)
AST: 16 IU/L (ref 0–40)
Albumin: 4.4 g/dL (ref 3.5–5.5)
Alkaline Phosphatase: 109 IU/L (ref 39–117)
BILIRUBIN TOTAL: 0.2 mg/dL (ref 0.0–1.2)
BUN/Creatinine Ratio: 15 (ref 9–23)
BUN: 11 mg/dL (ref 6–24)
CALCIUM: 9.6 mg/dL (ref 8.7–10.2)
CO2: 24 mmol/L (ref 20–29)
Chloride: 100 mmol/L (ref 96–106)
Creatinine, Ser: 0.72 mg/dL (ref 0.57–1.00)
GFR, EST AFRICAN AMERICAN: 114 mL/min/{1.73_m2} (ref >59)
GFR, EST NON AFRICAN AMERICAN: 99 mL/min/{1.73_m2} (ref >59)
GLOBULIN, TOTAL: 3 g/dL (ref 1.5–4.5)
Glucose: 112 mg/dL — ABNORMAL HIGH (ref 65–99)
POTASSIUM: 4.5 mmol/L (ref 3.5–5.2)
SODIUM: 140 mmol/L (ref 134–144)
TOTAL PROTEIN: 7.4 g/dL (ref 6.0–8.5)

## 2017-09-15 LAB — HEPATITIS B SURFACE ANTIGEN: Hepatitis B Surface Ag: NEGATIVE

## 2017-09-15 LAB — LIPID PANEL
Chol/HDL Ratio: 4.6 ratio — ABNORMAL HIGH (ref 0.0–4.4)
Cholesterol, Total: 208 mg/dL — ABNORMAL HIGH (ref 100–199)
HDL: 45 mg/dL (ref >39)
LDL CALC: 131 mg/dL — AB (ref 0–99)
Triglycerides: 158 mg/dL — ABNORMAL HIGH (ref 0–149)
VLDL CHOLESTEROL CAL: 32 mg/dL (ref 5–40)

## 2017-09-15 LAB — HEMOGLOBIN A1C
Est. average glucose Bld gHb Est-mCnc: 143 mg/dL
Hgb A1c MFr Bld: 6.6 % — ABNORMAL HIGH (ref 4.8–5.6)

## 2017-09-15 LAB — HIV ANTIBODY (ROUTINE TESTING W REFLEX): HIV Screen 4th Generation wRfx: NONREACTIVE

## 2017-09-15 LAB — RPR: RPR Ser Ql: NONREACTIVE

## 2017-09-15 MED ORDER — NEOMYCIN-POLYMYXIN-HC 3.5-10000-1 OT SUSP: 4.0000 [drp] | Freq: Three times a day (TID) | OTIC | 0 refills | Status: AC

## 2017-09-15 MED ORDER — NEOMYCIN-POLYMYXIN-HC 3.5-10000-1 OT SUSP: 4.0000 [drp] | Freq: Three times a day (TID) | OTIC | 0 refills | Status: DC

## 2017-09-15 NOTE — Patient Instructions (Addendum)
  To use the ear drops: -Lie down or tilt your head with your ear facing upward. Open the ear canal by gently pulling your ear back, or pulling downward on the earlobe when giving this medicine to a child. -Hold the dropper upside down over your ear and drop the correct number of drops into the ear. -Stay lying down or with your head tilted for at least 5 minutes. You may use a small piece of cotton to plug the ear and keep the medicine from draining out. -Do not touch the dropper tip or place it directly in your ear. It may become contaminated. Wipe the tip with a clean tissue but do not wash with water or soap. -Use this medicine for the full prescribed length of time. Your symptoms may improve before the infection is completely cleared. Skipping doses may also increase your risk of further infection that is resistant to antibiotics.   Otitis Externa Otitis externa is an infection of the outer ear canal. The outer ear canal is the area between the outside of the ear and the eardrum. Otitis externa is sometimes called "swimmer's ear." Follow these instructions at home:  If you were given antibiotic ear drops, use them as told by your doctor. Do not stop using them even if your condition gets better.  Take over-the-counter and prescription medicines only as told by your doctor.  Keep all follow-up visits as told by your doctor. This is important. How is this prevented?  Keep your ear dry. Use the corner of a towel to dry your ear after you swim or bathe.  Try not to scratch or put things in your ear. Doing these things makes it easier for germs to grow in your ear.  Avoid swimming in lakes, dirty water, or pools that may not have the right amount of a chemical called chlorine.  Consider making ear drops and putting 3 or 4 drops in each ear after you swim. Ask your doctor about how you can make ear drops. Contact a doctor if:  You have a fever.  After 3 days your ear is still red,  swollen, or painful.  After 3 days you still have pus coming from your ear.  Your redness, swelling, or pain gets worse.  You have a really bad headache.  You have redness, swelling, pain, or tenderness behind your ear. This information is not intended to replace advice given to you by your health care provider. Make sure you discuss any questions you have with your health care provider. Document Released: 08/04/2007 Document Revised: 03/13/2015 Document Reviewed: 11/25/2014 Elsevier Interactive Patient Education  2018 [employer].    IF you received an x-ray today, you will receive an invoice from [facility]. Please contact [facility] at [phone] with questions or concerns regarding your invoice.   IF you received labwork today, you will receive an invoice from [facility]. Please contact [facility] at [phone] with questions or concerns regarding your invoice.   Our billing staff will not be able to assist you with questions regarding bills from these companies.  You will be contacted with the lab results as soon as they are available. The fastest way to get your results is to activate your My Chart account. Instructions are located on the last page of this paperwork. If you have not heard from [place] regarding the results in 2 weeks, please contact this office.

## 2017-09-15 NOTE — Telephone Encounter (Signed)
FYI  Pt. Came into office around 5pm 09/15/17 to request antibiotic ear drop based on assertion Dr. Nolon Rod was supposed to prescribe the medication during their previous visit. After review of medication pt. Asserted flonase was not the only medication to be prescribed. Pt. Asserted due to circumstance issue should be resolved today. Due to same-day request decided phone message would not meet pt's desired outcome. After review of situation with PA Timmothy Euler no medication was to be prescribed without visit or Dr. Nolon Rod input due to no mention of ear pain in office notes (PA Timmothy Euler did mention notes on visit were not finished).  With no further options available I offered the pt. A Same Day appt (with approval from Ms. Timmothy Euler) pt. Agreed to pay copay associated with visit on condition Dr. Nolon Rod would be advised of the issue. As of 5:31pm pt. Has been taken back to the back to be seen.

## 2017-09-15 NOTE — Telephone Encounter (Signed)
Copied from [place] [phone]. Topic: General - Other >> Sep 15, 2017  9:53 AM Carolyn Stare wrote:  Pt cal lto ask for a antibiotic ear drop. She said she discuss her ear pain with the doctor and was suppose to pick up the drops at the pharmacy and no drops were there. She said she need these today    [place]

## 2017-09-15 NOTE — Progress Notes (Signed)
Cathy Parrish  MRN: [id number] DOB: 07/18/1970  Subjective:  Cathy Parrish is a 47 y.o. female seen in office today for a chief complaint of left ear pain x 3 days.  Had itching at first but that has since resolved.  Has associated fullness, swelling, and decreased hearing. Some ringing. Uses qtips frequently. Has seasonal allergies. Started flonase last night, has not helped.  No acute trauma. Denies recent swimming. Has had similar issues in the past and was treated with cortisporin, which really helped.   Review of Systems  Constitutional: Negative for chills and fever.  HENT: Positive for congestion and sneezing. Negative for sinus pain.   Neurological: Negative for light-headedness.    Patient Active Problem List   Diagnosis Date Noted  . B12 deficiency 07/12/2011  . NSAID long-term use-Goody's 07/12/2011  . Weakness 07/11/2011  . Iron deficiency anemia 07/11/2011  . Hypertension     Current Outpatient Medications on File Prior to Visit  Medication Sig Dispense Refill  . albuterol (PROVENTIL HFA;VENTOLIN HFA) 108 (90 BASE) MCG/ACT inhaler Inhale 2 puffs into the lungs every 4 (four) hours as needed for wheezing. 1 Inhaler 0  . Aspirin-Salicylamide-Caffeine (BC HEADACHE POWDER PO) Take 1 packet by mouth daily as needed (for headache).    . dicyclomine (BENTYL) 10 MG capsule Take 10 mg by mouth 2 (two) times daily with a meal. 30 prior to meal    . fluticasone (FLONASE) 50 MCG/ACT nasal spray Place 2 sprays into both nostrils 2 (two) times daily. Decrease to 2 sprays/nostril daily after 5 days 16 g 2  . hydrochlorothiazide (HYDRODIURIL) 25 MG tablet Take 25 mg by mouth daily.    Marland Kitchen ibuprofen (ADVIL,MOTRIN) 200 MG tablet Take 4 tablets (800 mg total) by mouth every 6 (six) hours as needed for pain. 30 tablet 4  . irbesartan-hydrochlorothiazide (AVALIDE) 150-12.5 MG tablet Take 1 tablet by mouth daily. 90 tablet 1  . neomycin-polymyxin-hydrocortisone (CORTISPORIN) 3.5-10000-1 OTIC  suspension Place 4 drops into both ears 3 (three) times daily. 10 mL 0  . phenazopyridine (PYRIDIUM) 200 MG tablet Take 1 tablet (200 mg total) by mouth 3 (three) times daily. 6 tablet 0  . sertraline (ZOLOFT) 50 MG tablet Take 50 mg by mouth daily.    . Tetrahydrozoline HCl (VISINE OP) Apply 1 drop to eye as needed (for allergies).     No current facility-administered medications on file prior to visit.     No Known Allergies   Objective:  BP 132/82 (BP Location: Left Arm, Patient Position: Sitting, Cuff Size: Normal)   Temp 98.6 F (37 C) (Oral)   Ht 5\' 6"  (1.676 m)   Wt 266 lb (120.7 kg)   SpO2 96%   BMI 42.93 kg/m   Physical Exam  Constitutional: She is oriented to person, place, and time. She appears well-developed and well-nourished. No distress.  HENT:  Head: Normocephalic and atraumatic.  Right Ear: Tympanic membrane, external ear and ear canal normal.  Left Ear: There is swelling (ear canal is moderately edeamtous and mildly erythematous) and tenderness (when tragal pressure is applied and with manipulation of auricle). No drainage. No mastoid tenderness. Tympanic membrane is not injected, not perforated, not erythematous and not bulging.  Eyes: Conjunctivae are normal.  Neck: Normal range of motion.  Pulmonary/Chest: Effort normal.  Lymphadenopathy:       Head (right side): No preauricular and no posterior auricular adenopathy present.       Head (left side): No preauricular and no posterior  auricular adenopathy present.  Neurological: She is alert and oriented to person, place, and time.  Skin: Skin is warm and dry.  Psychiatric: She has a normal mood and affect.  Vitals reviewed.   Assessment and Plan :  1. Acute otitis externa of left ear, unspecified type Hx and PE findings consistent with otitis externa. TM intact. Given Rx for cortisporin. Advised to return to clinic if symptoms worsen, do not improve, or as needed.  - neomycin-polymyxin-hydrocortisone  (CORTISPORIN) 3.5-10000-1 OTIC suspension; Place 4 drops into both ears 3 (three) times daily for 10 days.  Dispense: 10 mL; Refill: 0   Tenna Delaine PA-C  [facility] 09/15/2017 5:50 PM

## 2017-09-20 LAB — PAP IG, CT-NG NAA, HPV HIGH-RISK
Chlamydia, Nuc. Acid Amp: NEGATIVE
GONOCOCCUS BY NUCLEIC ACID AMP: NEGATIVE
HPV, high-risk: NEGATIVE
PAP SMEAR COMMENT: 0

## 2017-09-23 ENCOUNTER — Other Ambulatory Visit: Payer: Self-pay | Admitting: Family Medicine

## 2017-12-29 ENCOUNTER — Other Ambulatory Visit: Payer: Self-pay | Admitting: Family Medicine

## 2018-09-04 ENCOUNTER — Other Ambulatory Visit: Payer: Self-pay

## 2018-09-04 ENCOUNTER — Ambulatory Visit (INDEPENDENT_AMBULATORY_CARE_PROVIDER_SITE_OTHER): Payer: BC Managed Care – PPO | Admitting: Family Medicine

## 2018-09-04 ENCOUNTER — Encounter: Payer: Self-pay | Admitting: Family Medicine

## 2018-09-04 VITALS — BP 129/77 | HR 86 | Temp 98.2°F | Resp 18 | Ht 66.0 in | Wt 268.8 lb

## 2018-09-04 DIAGNOSIS — G44209 Tension-type headache, unspecified, not intractable: Secondary | ICD-10-CM

## 2018-09-04 DIAGNOSIS — E119 Type 2 diabetes mellitus without complications: Secondary | ICD-10-CM

## 2018-09-04 DIAGNOSIS — R51 Headache: Secondary | ICD-10-CM | POA: Diagnosis not present

## 2018-09-04 DIAGNOSIS — R519 Headache, unspecified: Secondary | ICD-10-CM

## 2018-09-04 DIAGNOSIS — L84 Corns and callosities: Secondary | ICD-10-CM

## 2018-09-04 MED ORDER — QUETIAPINE FUMARATE 25 MG PO TABS: 25.0000 mg | ORAL_TABLET | Freq: Every day | ORAL | 3 refills | Status: DC

## 2018-09-04 MED ORDER — SUMATRIPTAN SUCCINATE 50 MG PO TABS: ORAL_TABLET | ORAL | 0 refills | Status: AC

## 2018-09-04 MED ORDER — CYCLOBENZAPRINE HCL 10 MG PO TABS: 10.0000 mg | ORAL_TABLET | Freq: Three times a day (TID) | ORAL | 0 refills | Status: DC | PRN

## 2018-09-04 MED ORDER — ALPRAZOLAM 0.5 MG PO TABS: 0.5000 mg | ORAL_TABLET | Freq: Every evening | ORAL | 1 refills | Status: DC | PRN

## 2018-09-04 NOTE — Patient Instructions (Signed)
° ° ° °  If you have lab work done today you will be contacted with your lab results within the next 2 weeks.  If you have not heard from us then please contact us. The fastest way to get your results is to register for My Chart. ° ° °IF you received an x-ray today, you will receive an invoice from [facility]. Please contact [facility] at [phone] with questions or concerns regarding your invoice.  ° °IF you received labwork today, you will receive an invoice from [facility]. Please contact [facility] at [phone] with questions or concerns regarding your invoice.  ° °Our billing staff will not be able to assist you with questions regarding bills from these companies. ° °You will be contacted with the lab results as soon as they are available. The fastest way to get your results is to activate your My Chart account. Instructions are located on the last page of this paperwork. If you have not heard from us regarding the results in 2 weeks, please contact this office. °  ° ° ° °

## 2018-09-04 NOTE — Progress Notes (Signed)
Established Patient Office Visit  Subjective:  Patient ID: Cathy Parrish, female    DOB: [birth year]  Age: 48 y.o. MRN: [id number]  CC:  Chief Complaint  Patient presents with   Headache    states it feels like a tension headache; occurs mostly in the morning; interfers with vision/light sensitivity   Medication Refill    Alprazolam and Bentyl    HPI Cathy Parrish presents for   Patient reports that for the past 2 weeks she has been having a daily headache that she wakes up with and is there until she takes bc powder and tylenol and it subsides The pain is 9/10 and is throbbing dull pain with pain with range of motion of the neck She she denies caffeine withdrawal She also denies any changes to her medications She added some xanax Her 43 yo father was diagnosed with liver cancer about 2-3 weeks ago She has not been sleeping well She was prescribed the xanax to help with her stress  She states that she gets about 5 hours of interrupted sleep and sometimes she feels like her head is heavy.  She denies nausea, but occasionally has some blurry vision  Her last eye exam was 2 years ago   PMP aware shows no recent controlled substance refill She has not taken xanax for at least 5 days      Past Medical History:  Diagnosis Date   Asthma    Depression    Hypertension     Past Surgical History:  Procedure Laterality Date   BREAST BIOPSY Right    COLONOSCOPY  07/13/2011   Procedure: COLONOSCOPY;  Surgeon: Gatha Mayer, MD;  Location: [facility];  Service: Endoscopy;  Laterality: N/A;   DILATION AND CURETTAGE OF UTERUS  abortion   DILITATION & CURRETTAGE/HYSTROSCOPY WITH NOVASURE ABLATION N/A 09/28/2012   Procedure: DILATATION & CURETTAGE/HYSTEROSCOPY WITH NOVASURE ABLATION; And Resectoscope;  Surgeon: Marvene Staff, MD;  Location: [facility];  Service: Gynecology;  Laterality: N/A;   ESOPHAGOGASTRODUODENOSCOPY  07/13/2011   Procedure:  ESOPHAGOGASTRODUODENOSCOPY (EGD);  Surgeon: Gatha Mayer, MD;  Location: [facility] END[facility]COPY;  Service: Endoscopy;  Laterality: N/A;    Family History  Problem Relation Age of Onset   Breast cancer Sister     Social History   Socioeconomic History   Marital status: Single    Spouse name: Not on file   Number of children: Not on file   Years of education: Not on file   Highest education level: Not on file  Occupational History   Not on file  Social Needs   Financial resource strain: Not on file   Food insecurity    Worry: Not on file    Inability: Not on file   Transportation needs    Medical: Not on file    Non-medical: Not on file  Tobacco Use   Smoking status: Never Smoker   Smokeless tobacco: Never Used  Substance and Sexual Activity   Alcohol use: Yes    Alcohol/week: 3.0 standard drinks    Types: 3 Cans of beer per week    Comment: twice a week   Drug use: No   Sexual activity: Yes    Birth control/protection: Condom  Lifestyle   Physical activity    Days per week: Not on file    Minutes per session: Not on file   Stress: Not on file  Relationships   Social connections    Talks on phone: Not on file  Gets together: Not on file    Attends religious service: Not on file    Active member of club or organization: Not on file    Attends meetings of clubs or organizations: Not on file    Relationship status: Not on file   Intimate partner violence    Fear of current or ex partner: Not on file    Emotionally abused: Not on file    Physically abused: Not on file    Forced sexual activity: Not on file  Other Topics Concern   Not on file  Social History Narrative   Not on file    Outpatient Medications Prior to Visit  Medication Sig Dispense Refill   albuterol (PROVENTIL HFA;VENTOLIN HFA) 108 (90 BASE) MCG/ACT inhaler Inhale 2 puffs into the lungs every 4 (four) hours as needed for wheezing. 1 Inhaler 0   Aspirin-Salicylamide-Caffeine (BC  HEADACHE POWDER PO) Take 1 packet by mouth daily as needed (for headache).     dicyclomine (BENTYL) 10 MG capsule Take 10 mg by mouth 2 (two) times daily with a meal. 30 prior to meal     fluticasone (FLONASE) 50 MCG/ACT nasal spray Place 2 sprays into both nostrils 2 (two) times daily. Decrease to 2 sprays/nostril daily after 5 days 16 g 2   ibuprofen (ADVIL,MOTRIN) 200 MG tablet Take 4 tablets (800 mg total) by mouth every 6 (six) hours as needed for pain. 30 tablet 4   irbesartan-hydrochlorothiazide (AVALIDE) 150-12.5 MG tablet TAKE 1 TABLET BY MOUTH ONCE DAILY 90 tablet 2   Tetrahydrozoline HCl (VISINE OP) Apply 1 drop to eye as needed (for allergies).     hydrochlorothiazide (HYDRODIURIL) 25 MG tablet Take 25 mg by mouth daily.     phenazopyridine (PYRIDIUM) 200 MG tablet Take 1 tablet (200 mg total) by mouth 3 (three) times daily. 6 tablet 0   sertraline (ZOLOFT) 50 MG tablet Take 50 mg by mouth daily.     No facility-administered medications prior to visit.     No Known Allergies  ROS Review of Systems Review of Systems  Constitutional: Negative for activity change, appetite change, chills and fever.  HENT: Negative for congestion, nosebleeds, trouble swallowing and voice change.   Respiratory: Negative for cough, shortness of breath and wheezing.   Gastrointestinal: Negative for diarrhea, nausea and vomiting.  Genitourinary: Negative for difficulty urinating, dysuria, flank pain and hematuria.  Musculoskeletal: Negative for back pain, joint swelling and neck pain.  Neurological: Negative for dizziness, speech difficulty, light-headedness and numbness.  See HPI. All other review of systems negative.     Objective:    Physical Exam  BP 129/77 (BP Location: Right Arm, Patient Position: Sitting, Cuff Size: Large)    Pulse 86    Temp 98.2 F (36.8 C) (Oral)    Resp 18    Ht _0  (1.676 m)    Wt 268 lb 12.8 oz (121.9 kg)    SpO2 97%    BMI 43.39 kg/m  Wt Readings from  Last 3 Encounters:  09/04/18 268 lb 12.8 oz (121.9 kg)  09/15/17 266 lb (120.7 kg)  09/14/17 266 lb 9.6 oz (120.9 kg)   Physical Exam  Constitutional: Oriented to person, place, and time. Appears well-developed and well-nourished.  HENT:  Head: Normocephalic and atraumatic.  Eyes: Conjunctivae and EOM are normal.  Cardiovascular: Normal rate, regular rhythm, normal heart sounds and intact distal pulses.  No murmur heard. Pulmonary/Chest: Effort normal and breath sounds normal. No stridor. No respiratory distress. Has  no wheezes.  Neurological: Is alert and oriented to person, place, and time.  Skin: Skin is warm. Capillary refill takes less than 2 seconds.  Psychiatric: Has a normal mood and affect. Behavior is normal. Judgment and thought content normal.  Foot exam shows warm feet, DP and PT 2+, with callouses noted  There are no preventive care reminders to display for this patient.  There are no preventive care reminders to display for this patient.  No results found for: TSH Lab Results  Component Value Date   WBC 7.4 09/26/2012   HGB 9.2 (L) 09/26/2012   HCT 31.6 (L) 09/26/2012   MCV 75.1 (L) 09/26/2012   PLT 333 09/26/2012   Lab Results  Component Value Date   NA 140 09/14/2017   K 4.5 09/14/2017   CO2 24 09/14/2017   GLUCOSE 112 (H) 09/14/2017   BUN 11 09/14/2017   CREATININE 0.72 09/14/2017   BILITOT 0.2 09/14/2017   ALKPHOS 109 09/14/2017   AST 16 09/14/2017   ALT 14 09/14/2017   PROT 7.4 09/14/2017   ALBUMIN 4.4 09/14/2017   CALCIUM 9.6 09/14/2017   Lab Results  Component Value Date   CHOL 208 (H) 09/14/2017   Lab Results  Component Value Date   HDL 45 09/14/2017   Lab Results  Component Value Date   LDLCALC 131 (H) 09/14/2017   Lab Results  Component Value Date   TRIG 158 (H) 09/14/2017   Lab Results  Component Value Date   CHOLHDL 4.6 (H) 09/14/2017   Lab Results  Component Value Date   HGBA1C 6.6 (H) 09/14/2017      Assessment &  Plan:   Problem List Items Addressed This Visit    None    Visit Diagnoses    Newly diagnosed diabetes ([place])    -  Primary Discussed her diabetic state  Evaluated feet today Blisters noted and advised to follow up with podiatry   Relevant Orders   CBC   Hemoglobin A1c   CMP14+EGFR   VITAMIN D 25 Hydroxy (Vit-D Deficiency, Fractures)   Ambulatory referral to Ophthalmology   HM Diabetes Foot Exam (Completed)   Morbid obesity ([place])     Pt continued to have difficulty with weight loss   Relevant Orders   CBC   Hemoglobin A1c   CMP14+EGFR   VITAMIN D 25 Hydroxy (Vit-D Deficiency, Fractures)   Daily headache       Relevant Medications   cyclobenzaprine (FLEXERIL) 10 MG tablet   SUMAtriptan (IMITREX) 50 MG tablet   Other Relevant Orders   CBC   Hemoglobin A1c   CMP14+EGFR   VITAMIN D 25 Hydroxy (Vit-D Deficiency, Fractures)   Ambulatory referral to Ophthalmology   Ambulatory referral to Physical Therapy   Tension headache    -  Advised to get more sleep To get PT to help with her muscle spasms   Relevant Medications   cyclobenzaprine (FLEXERIL) 10 MG tablet   SUMAtriptan (IMITREX) 50 MG tablet   Other Relevant Orders   Ambulatory referral to Physical Therapy    Foot callous - referral for podiatry  Meds ordered this encounter  Medications   cyclobenzaprine (FLEXERIL) 10 MG tablet    Sig: Take 1 tablet (10 mg total) by mouth 3 (three) times daily as needed for muscle spasms.    Dispense:  30 tablet    Refill:  0   SUMAtriptan (IMITREX) 50 MG tablet    Sig: Take one tablet by mouth at the first sign  of headache. May repeat in 2 hours if headache persists or recurs.    Dispense:  10 tablet    Refill:  0    Follow-up: No follow-ups on file.    Forrest Moron, MD

## 2018-09-05 LAB — CBC
Hematocrit: 42.5 % (ref 34.0–46.6)
Hemoglobin: 14 g/dL (ref 11.1–15.9)
MCH: 29.4 pg (ref 26.6–33.0)
MCHC: 32.9 g/dL (ref 31.5–35.7)
MCV: 89 fL (ref 79–97)
Platelets: 347 10*3/uL (ref 150–450)
RBC: 4.77 x10E6/uL (ref 3.77–5.28)
RDW: 14.6 % (ref 11.7–15.4)
WBC: 7.1 10*3/uL (ref 3.4–10.8)

## 2018-09-05 LAB — CMP14+EGFR
ALT: 16 IU/L (ref 0–32)
AST: 21 IU/L (ref 0–40)
Albumin/Globulin Ratio: 1.7 (ref 1.2–2.2)
Albumin: 4.6 g/dL (ref 3.8–4.8)
Alkaline Phosphatase: 119 IU/L — ABNORMAL HIGH (ref 39–117)
BUN/Creatinine Ratio: 21 (ref 9–23)
BUN: 15 mg/dL (ref 6–24)
Bilirubin Total: 0.3 mg/dL (ref 0.0–1.2)
CO2: 21 mmol/L (ref 20–29)
Calcium: 9.8 mg/dL (ref 8.7–10.2)
Chloride: 102 mmol/L (ref 96–106)
Creatinine, Ser: 0.7 mg/dL (ref 0.57–1.00)
GFR calc Af Amer: 117 mL/min/{1.73_m2} (ref >59)
GFR calc non Af Amer: 101 mL/min/{1.73_m2} (ref >59)
Globulin, Total: 2.7 g/dL (ref 1.5–4.5)
Glucose: 138 mg/dL — ABNORMAL HIGH (ref 65–99)
Potassium: 4.2 mmol/L (ref 3.5–5.2)
Sodium: 140 mmol/L (ref 134–144)
Total Protein: 7.3 g/dL (ref 6.0–8.5)

## 2018-09-05 LAB — HEMOGLOBIN A1C
Est. average glucose Bld gHb Est-mCnc: 143 mg/dL
Hgb A1c MFr Bld: 6.6 % — ABNORMAL HIGH (ref 4.8–5.6)

## 2018-09-05 LAB — VITAMIN D 25 HYDROXY (VIT D DEFICIENCY, FRACTURES): Vit D, 25-Hydroxy: 8.2 ng/mL — ABNORMAL LOW (ref 30.0–100.0)

## 2018-09-13 ENCOUNTER — Ambulatory Visit: Payer: BC Managed Care – PPO | Attending: Family Medicine | Admitting: Physical Therapy

## 2018-09-13 ENCOUNTER — Other Ambulatory Visit: Payer: Self-pay

## 2018-09-13 ENCOUNTER — Encounter: Payer: Self-pay | Admitting: Physical Therapy

## 2018-09-13 DIAGNOSIS — G44209 Tension-type headache, unspecified, not intractable: Secondary | ICD-10-CM | POA: Diagnosis not present

## 2018-09-13 DIAGNOSIS — R293 Abnormal posture: Secondary | ICD-10-CM | POA: Diagnosis not present

## 2018-09-14 ENCOUNTER — Other Ambulatory Visit: Payer: Self-pay

## 2018-09-14 NOTE — Therapy (Signed)
[place], [place], [address] Phone: [phone]   Fax:  [phone]  Physical Therapy Evaluation  Patient Details  Name: Cathy Parrish MRN: [id number] Date of Birth: [birth year] Referring Provider (PT): Forrest Moron, MD   Encounter Date: 09/13/2018  PT End of Session - 09/13/18 1710    Visit Number  1    Number of Visits  7    Date for PT Re-Evaluation  11/03/18    PT Start Time  1632    PT Stop Time  1710    PT Time Calculation (min)  38 min    Activity Tolerance  Patient tolerated treatment well    Behavior During Therapy  [facility] for tasks assessed/performed       Past Medical History:  Diagnosis Date  . Asthma   . Depression   . Hypertension     Past Surgical History:  Procedure Laterality Date  . BREAST BIOPSY Right   . COLONOSCOPY  07/13/2011   Procedure: COLONOSCOPY;  Surgeon: Gatha Mayer, MD;  Location: [facility];  Service: Endoscopy;  Laterality: N/A;  . DILATION AND CURETTAGE OF UTERUS  abortion  . DILITATION & CURRETTAGE/HYSTROSCOPY WITH NOVASURE ABLATION N/A 09/28/2012   Procedure: DILATATION & CURETTAGE/HYSTEROSCOPY WITH NOVASURE ABLATION; And Resectoscope;  Surgeon: Marvene Staff, MD;  Location: [facility];  Service: Gynecology;  Laterality: N/A;  . ESOPHAGOGASTRODUODENOSCOPY  07/13/2011   Procedure: ESOPHAGOGASTRODUODENOSCOPY (EGD);  Surgeon: Gatha Mayer, MD;  Location: [facility];  Service: Endoscopy;  Laterality: N/A;    There were no vitals filed for this visit.   Subjective Assessment - 09/13/18 1639    Subjective  Daily HA over last 2 weeks but this has been a chronic issue. Increased pain looking at computer. Tension HA across temporal regions. Vision changes but denies nausea. Daily walking for exercise.    Currently in Pain?  Yes    Pain Score  9     Pain Location  Head    Pain Orientation  Anterior    Pain Descriptors / Indicators  Headache    Aggravating  Factors   looking at computer, seasonal allergies    Pain Relieving Factors  BC powder         [facility] PT Assessment - 09/13/18 0001      Assessment   Medical Diagnosis  daily headaches    Referring Provider (PT)  Forrest Moron, MD    Onset Date/Surgical Date  --   about 2 weeks ago   Hand Dominance  Right    Prior Therapy  no      Precautions   Precautions  None      Restrictions   Weight Bearing Restrictions  No      Balance Screen   Has the patient fallen in the past 6 months  --   fell out of shower a couple of times but not in last 6 mo     [place]    Additional Comments  no stairs at home      Prior Function   Level of Independence  Independent    Vocation  Full time employment    Vocation Requirements  billing specialist      Cognition   Overall Cognitive Status  Within Functional Limits for tasks assessed      Sensation   Additional Comments  [facility]  Posture/Postural Control   Posture Comments  Rt shoulder elevation, forward head, rounded shoulders      ROM / Strength   AROM / PROM / Strength  AROM      AROM   AROM Assessment Site  Cervical    Cervical Flexion  22    Cervical - Right Side Bend  24    Cervical - Left Side Bend  28    Cervical - Right Rotation  52    Cervical - Left Rotation  68                Objective measurements completed on examination: See above findings.      [facility] Adult PT Treatment/Exercise - 09/13/18 0001      Exercises   Exercises  Neck      Neck Exercises: Seated   Other Seated Exercise  scapular retraction      Manual Therapy   Manual Therapy  Joint mobilization;Soft tissue mobilization    Joint Mobilization  Rt first rib depression    Soft tissue mobilization  Rt upper trap & rotation stretching      Neck Exercises: Stretches   Other Neck Stretches  UE ER for pec stretch    Other Neck Stretches  first rib  depression with sheet+upper trap stretch             PT Education - 09/13/18 1712    Education Details  anatomy of condition, POC, HEP, exercise form/rationale, thoracic support brace, bra wear, possible use of TPDN    Person(s) Educated  Patient    Methods  Explanation;Demonstration;Tactile cues;Verbal cues;Handout    Comprehension  Verbalized understanding;Returned demonstration;Verbal cues required;Tactile cues required;Need further instruction       PT Short Term Goals - 09/14/18 0731      PT SHORT TERM GOAL #1   Title  pt will incoorporate regular movement throughout the day to reduce time spent in static postures    Baseline  requested she set an alarm for every 3 min at eval    Time  3    Period  Weeks    Status  New    Target Date  10/05/18        PT Long Term Goals - 09/14/18 0732      PT LONG TERM GOAL #1   Title  cervical rotation equal Rt to Lt    Baseline  see flowsheet    Time  6    Period  Weeks    Status  New    Target Date  10/27/18      PT LONG TERM GOAL #2   Title  HA <=1/week    Baseline  daily for 2 weeks at eval    Time  6    Period  Weeks    Status  New    Target Date  10/27/18      PT LONG TERM GOAL #3   Title  will present with proper resting posture    Baseline  Rt shoulder elevation, rounded shoulders, fwd head at eval    Time  6    Period  Weeks    Status  New    Target Date  10/27/18      PT LONG TERM GOAL #4   Title  pt will be independent in long term HEP    Baseline  will progress and establish as appropriate    Time  6    Period  Weeks  Status  New    Target Date  10/27/18             Plan - 09/13/18 1712    Clinical Impression Statement  Pt presents to PT with complaints of HA that she has had chronic issues with but have increased to a daily frequency in last 2 weeks. Feels like a tension-type HA. Is on mandatory overtime for work right now and is on a computer all day. Notable elevation of right first rib  decreased with manual therapy today and pt reported a decrease in HA pain. Pt will benefit from skilled PT in order to improve posture to decrease HA.    Personal Factors and Comorbidities  Time since onset of injury/illness/exacerbation;Fitness;Comorbidity 1    Comorbidities  HTN, depression    Examination-Activity Limitations  Reach Overhead;Sit;Sleep;Lift;Other   computer work   Art therapist;Other   work   Merchant [employer] officer  Evolving/Moderate complexity    Clinical Decision Making  Moderate    Rehab Potential  Good    PT Frequency  1x / week    PT Duration  6 weeks    PT Treatment/Interventions  ADLs/Self Care Home Management;Cryotherapy;Electrical Stimulation;Traction;Moist Heat;Iontophoresis 4mg /ml Dexamethasone;Therapeutic activities;Therapeutic exercise;Neuromuscular re-education;Manual techniques;Patient/family education;Passive range of motion;Dry needling;Taping    PT Next Visit Plan  consider DN, anterior stretching/post strengthening, discuss work Scientist, water quality    PT [place]  first rib depression, scap retraction, UE ER for pec stretch, get up every 30 min    Consulted and Agree with Plan of Care  Patient       Patient will benefit from skilled therapeutic intervention in order to improve the following deficits and impairments:  Decreased range of motion, Impaired UE functional use, Increased muscle spasms, Decreased activity tolerance, Pain, Impaired flexibility, Improper body mechanics, Postural dysfunction, Decreased strength  Visit Diagnosis: 1. Tension-type headache, not intractable, unspecified chronicity pattern   2. Abnormal posture        Problem List Patient Active Problem List   Diagnosis Date Noted  . B12 deficiency 07/12/2011  . NSAID long-term use-Goody's 07/12/2011  . Weakness 07/11/2011  . Iron deficiency anemia 07/11/2011  . Hypertension    Tamas Suen C. Geraline Halberstadt PT, DPT 09/14/18  7:38 AM   [facility] [address] [place], [place], [address] Phone: [phone]   Fax:  [phone]  Name: Cathy Parrish MRN: [id number] Date of Birth: Nov 10, 1970

## 2018-09-22 ENCOUNTER — Other Ambulatory Visit: Payer: Self-pay | Admitting: Physician Assistant

## 2018-09-22 ENCOUNTER — Encounter

## 2018-09-22 DIAGNOSIS — H60502 Unspecified acute noninfective otitis externa, left ear: Secondary | ICD-10-CM

## 2018-09-22 NOTE — Telephone Encounter (Signed)
Requested medications are due for refill today? No  Requested medications are on the active medication list?  No  Last refill- Not on current medication list    Future visit scheduled?  Yes- 12/05/2018 with Dr. Nolon Rod.   Notes to clinic   Requested Prescriptions  Pending Prescriptions Disp Refills   neomycin-polymyxin-hydrocortisone (CORTISPORIN) 3.5-10000-1 OTIC suspension [Pharmacy Med Name: NEO/POLY/HC 1% OTIC SUSPGREEN(EAR)] 10 mL 0    Sig: SHAKE LIQUID AND INSTILL 4 DROPS IN BOTH EARS THREE TIMES DAILY FOR 10 DAYS     Off-Protocol Failed - 09/22/2018  9:47 AM      Failed - Medication not assigned to a protocol, review manually.      Passed - Valid encounter within last 12 months    Recent Outpatient Visits          2 weeks ago Newly diagnosed diabetes [facility])   Primary Care at [facility], Arlie Solomons, MD   1 year ago Acute otitis externa of left ear, unspecified type   Primary Care at [facility], [place] D, PA-C   1 year ago Encounter for health maintenance examination in adult   Primary Care at [facility], Arlie Solomons, MD   1 year ago Essential hypertension   Primary [facility] at [facility], MD      Future Appointments            In 2 months Forrest Moron, MD Primary Care at [place], [facility]

## 2018-09-27 ENCOUNTER — Other Ambulatory Visit: Payer: Self-pay

## 2018-09-27 ENCOUNTER — Ambulatory Visit: Payer: BC Managed Care – PPO | Admitting: Physical Therapy

## 2018-09-27 ENCOUNTER — Encounter: Payer: Self-pay | Admitting: Physical Therapy

## 2018-09-27 DIAGNOSIS — R293 Abnormal posture: Secondary | ICD-10-CM | POA: Diagnosis not present

## 2018-09-27 DIAGNOSIS — G44209 Tension-type headache, unspecified, not intractable: Secondary | ICD-10-CM

## 2018-09-27 NOTE — Therapy (Addendum)
[place], [place], [address] Phone: [phone]   Fax:  [phone]  Physical Therapy Treatment/Discharge  Patient Details  Name: MAVI UN MRN: [id number] Date of Birth: 09/23/1970 Referring Provider (PT): Forrest Moron, MD   Encounter Date: 09/27/2018  PT End of Session - 09/27/18 1708    Visit Number  2    Number of Visits  7    Date for PT Re-Evaluation  11/03/18    PT Start Time  6144    PT Stop Time  1744    PT Time Calculation (min)  39 min    Activity Tolerance  Patient tolerated treatment well    Behavior During Therapy  [facility] for tasks assessed/performed       Past Medical History:  Diagnosis Date  . Asthma   . Depression   . Hypertension     Past Surgical History:  Procedure Laterality Date  . BREAST BIOPSY Right   . COLONOSCOPY  07/13/2011   Procedure: COLONOSCOPY;  Surgeon: Gatha Mayer, MD;  Location: [place];  Service: Endoscopy;  Laterality: N/A;  . DILATION AND CURETTAGE OF UTERUS  abortion  . DILITATION & CURRETTAGE/HYSTROSCOPY WITH NOVASURE ABLATION N/A 09/28/2012   Procedure: DILATATION & CURETTAGE/HYSTEROSCOPY WITH NOVASURE ABLATION; And Resectoscope;  Surgeon: Marvene Staff, MD;  Location: [facility];  Service: Gynecology;  Laterality: N/A;  . ESOPHAGOGASTRODUODENOSCOPY  07/13/2011   Procedure: ESOPHAGOGASTRODUODENOSCOPY (EGD);  Surgeon: Gatha Mayer, MD;  Location: [facility] ENDOSCOPY;  Service: Endoscopy;  Laterality: N/A;    There were no vitals filed for this visit.  Subjective Assessment - 09/27/18 1707    Subjective  I do not have a HA today, just a little stretch/pull in my neck.    Currently in Pain?  Yes    Pain Score  5     Pain Location  Neck    Pain Orientation  Right    Pain Descriptors / Indicators  Tightness                       [facility] Adult PT Treatment/Exercise - 09/27/18 0001      Neck Exercises: Machines for Strengthening    UBE (Upper Arm Bike)  retro 4 min L1      Neck Exercises: Standing   Wall Push Ups  15 reps    Other Standing Exercises  row blue, extension green    Other Standing Exercises  triceps extension      Manual Therapy   Manual therapy comments  skilled palpation and monitoring during TPDN    Soft tissue mobilization  Rt upper trap      Neck Exercises: Stretches   Upper Trapezius Stretch  Right;Left;20 seconds    Other Neck Stretches  door pec stretch       Trigger Point Dry Needling - 09/27/18 0001    Consent Given?  Yes    Education Handout Provided  --   verbal education   Muscles Treated Head and Neck  Upper trapezius    Upper Trapezius Response  Twitch reponse elicited;Palpable increased muscle length   Right          PT Education - 09/27/18 1747    Education Details  TPDN & expected outcomes, work Psychologist, sport and exercise) Educated  Patient    Methods  Explanation    Comprehension  Verbalized understanding       PT Short Term Goals - 09/14/18  0731      PT SHORT TERM GOAL #1   Title  pt will incoorporate regular movement throughout the day to reduce time spent in static postures    Baseline  requested she set an alarm for every 3 min at eval    Time  3    Period  Weeks    Status  New    Target Date  10/05/18        PT Long Term Goals - 09/14/18 0732      PT LONG TERM GOAL #1   Title  cervical rotation equal Rt to Lt    Baseline  see flowsheet    Time  6    Period  Weeks    Status  New    Target Date  10/27/18      PT LONG TERM GOAL #2   Title  HA <=1/week    Baseline  daily for 2 weeks at eval    Time  6    Period  Weeks    Status  New    Target Date  10/27/18      PT LONG TERM GOAL #3   Title  will present with proper resting posture    Baseline  Rt shoulder elevation, rounded shoulders, fwd head at eval    Time  6    Period  Weeks    Status  New    Target Date  10/27/18      PT LONG TERM GOAL #4   Title  pt will be independent  in long term HEP    Baseline  will progress and establish as appropriate    Time  6    Period  Weeks    Status  New    Target Date  10/27/18            Plan - 09/27/18 1746    Clinical Impression Statement  Good tolerance to DN today, nervous so only one muscle approached. progressed strengthening exercises. no return of first rib elevation.    PT Treatment/Interventions  ADLs/Self Care Home Management;Cryotherapy;Electrical Stimulation;Traction;Moist Heat;Iontophoresis 28m/ml Dexamethasone;Therapeutic activities;Therapeutic exercise;Neuromuscular re-education;Manual techniques;Patient/family education;Passive range of motion;Dry needling;Taping    PT Next Visit Plan  cont strengthening    PT Home Exercise Plan  first rib depression, scap retraction, UE ER for pec stretch, get up every 30 min; row, ext, triceps kick, door stretch    Consulted and Agree with Plan of Care  Patient       Patient will benefit from skilled therapeutic intervention in order to improve the following deficits and impairments:  Decreased range of motion, Impaired UE functional use, Increased muscle spasms, Decreased activity tolerance, Pain, Impaired flexibility, Improper body mechanics, Postural dysfunction, Decreased strength  Visit Diagnosis: 1. Tension-type headache, not intractable, unspecified chronicity pattern   2. Abnormal posture        Problem List Patient Active Problem List   Diagnosis Date Noted  . B12 deficiency 07/12/2011  . NSAID long-term use-[place] 07/12/2011  . Weakness 07/11/2011  . Iron deficiency anemia 07/11/2011  . Hypertension     Amaris Garrette C. Zniyah Midkiff PT, DPT 09/27/18 5:48 PM   [facility]line[address]GTup[address] [place] [address]Phone: [phone]  Fax:  [phone] Name: TEVEA SHEEKMRN: [id number] of Birth: 603-14-1972 PHYSICAL THERAPY DISCHARGE SUMMARY  Visits from Start of Care: 2  Current  functional level related to goals / functional outcomes: See above  Remaining deficits: See above   Education / Equipment: Anatomy of condition, POC, HEP, exercise form/rationale  Plan: Patient agrees to discharge.  Patient goals were not met. Patient is being discharged due to not returning since the last visit.  ?????     Jaclin Finks C. Mikea Quadros PT, DPT 12/08/18 11:57 AM '

## 2018-10-04 ENCOUNTER — Ambulatory Visit: Payer: BC Managed Care – PPO | Admitting: Physical Therapy

## 2018-10-11 ENCOUNTER — Ambulatory Visit: Payer: BC Managed Care – PPO | Admitting: Physical Therapy

## 2018-10-18 ENCOUNTER — Ambulatory Visit: Payer: BC Managed Care – PPO | Admitting: Physical Therapy

## 2018-10-25 ENCOUNTER — Ambulatory Visit: Payer: BC Managed Care – PPO | Admitting: Physical Therapy

## 2018-11-15 ENCOUNTER — Other Ambulatory Visit: Payer: Self-pay | Admitting: Family Medicine

## 2018-11-15 DIAGNOSIS — Z1231 Encounter for screening mammogram for malignant neoplasm of breast: Secondary | ICD-10-CM

## 2018-11-16 ENCOUNTER — Ambulatory Visit
Admission: RE | Admit: 2018-11-16 | Discharge: 2018-11-16 | Disposition: A | Discharge status: Home / Self Care | Payer: BC Managed Care – PPO | Source: Ambulatory Visit | Attending: Family Medicine | Admitting: Family Medicine

## 2018-11-16 ENCOUNTER — Other Ambulatory Visit: Payer: Self-pay

## 2018-11-16 DIAGNOSIS — Z1231 Encounter for screening mammogram for malignant neoplasm of breast: Secondary | ICD-10-CM | POA: Diagnosis not present

## 2018-11-20 ENCOUNTER — Other Ambulatory Visit: Payer: Self-pay | Admitting: Family Medicine

## 2018-11-20 DIAGNOSIS — R928 Other abnormal and inconclusive findings on diagnostic imaging of breast: Secondary | ICD-10-CM

## 2018-11-24 ENCOUNTER — Ambulatory Visit
Admission: RE | Admit: 2018-11-24 | Discharge: 2018-11-24 | Disposition: A | Discharge status: Home / Self Care | Payer: BC Managed Care – PPO | Source: Ambulatory Visit | Attending: Family Medicine | Admitting: Family Medicine

## 2018-11-24 ENCOUNTER — Other Ambulatory Visit: Payer: Self-pay

## 2018-11-24 ENCOUNTER — Other Ambulatory Visit: Payer: Self-pay | Admitting: Family Medicine

## 2018-11-24 DIAGNOSIS — R928 Other abnormal and inconclusive findings on diagnostic imaging of breast: Secondary | ICD-10-CM

## 2018-11-24 DIAGNOSIS — R599 Enlarged lymph nodes, unspecified: Secondary | ICD-10-CM

## 2018-11-24 DIAGNOSIS — N6489 Other specified disorders of breast: Secondary | ICD-10-CM | POA: Diagnosis not present

## 2018-11-24 DIAGNOSIS — N6320 Unspecified lump in the left breast, unspecified quadrant: Secondary | ICD-10-CM

## 2018-12-04 ENCOUNTER — Ambulatory Visit
Admission: RE | Admit: 2018-12-04 | Discharge: 2018-12-04 | Disposition: A | Discharge status: Home / Self Care | Payer: BC Managed Care – PPO | Source: Ambulatory Visit | Attending: Family Medicine | Admitting: Family Medicine

## 2018-12-04 ENCOUNTER — Other Ambulatory Visit: Payer: Self-pay | Admitting: Family Medicine

## 2018-12-04 ENCOUNTER — Other Ambulatory Visit: Payer: Self-pay

## 2018-12-04 DIAGNOSIS — N6320 Unspecified lump in the left breast, unspecified quadrant: Secondary | ICD-10-CM

## 2018-12-04 DIAGNOSIS — R599 Enlarged lymph nodes, unspecified: Secondary | ICD-10-CM

## 2018-12-04 DIAGNOSIS — N6323 Unspecified lump in the left breast, lower outer quadrant: Secondary | ICD-10-CM | POA: Diagnosis not present

## 2018-12-04 DIAGNOSIS — R590 Localized enlarged lymph nodes: Secondary | ICD-10-CM | POA: Diagnosis not present

## 2018-12-04 DIAGNOSIS — D242 Benign neoplasm of left breast: Secondary | ICD-10-CM | POA: Diagnosis not present

## 2018-12-04 MED ORDER — VITAMIN D (ERGOCALCIFEROL) 1.25 MG (50000 UNIT) PO CAPS: 50000.0000 [IU] | ORAL_CAPSULE | ORAL | 1 refills | Status: DC

## 2018-12-05 ENCOUNTER — Ambulatory Visit: Payer: BC Managed Care – PPO | Admitting: Family Medicine

## 2018-12-05 ENCOUNTER — Other Ambulatory Visit: Payer: Self-pay

## 2018-12-05 ENCOUNTER — Encounter: Payer: Self-pay | Admitting: Family Medicine

## 2018-12-05 VITALS — BP 128/87 | HR 104 | Temp 97.9°F | Resp 17 | Ht 66.0 in | Wt 269.0 lb

## 2018-12-05 DIAGNOSIS — E119 Type 2 diabetes mellitus without complications: Secondary | ICD-10-CM

## 2018-12-05 DIAGNOSIS — I10 Essential (primary) hypertension: Secondary | ICD-10-CM

## 2018-12-05 DIAGNOSIS — R928 Other abnormal and inconclusive findings on diagnostic imaging of breast: Secondary | ICD-10-CM | POA: Diagnosis not present

## 2018-12-05 DIAGNOSIS — Z23 Encounter for immunization: Secondary | ICD-10-CM | POA: Diagnosis not present

## 2018-12-05 DIAGNOSIS — F439 Reaction to severe stress, unspecified: Secondary | ICD-10-CM | POA: Diagnosis not present

## 2018-12-05 LAB — POCT GLYCOSYLATED HEMOGLOBIN (HGB A1C): Hemoglobin A1C: 6.5 % — AB (ref 4.0–5.6)

## 2018-12-05 MED ORDER — QUETIAPINE FUMARATE 25 MG PO TABS: 25.0000 mg | ORAL_TABLET | Freq: Every day | ORAL | 3 refills | Status: DC

## 2018-12-05 NOTE — Progress Notes (Signed)
Established Patient Office Visit  Subjective:  Patient ID: Cathy Parrish, female    DOB: [birth year]  Age: 48 y.o. MRN: [id number]  CC:  Chief Complaint  Patient presents with  . Diabetes    3 month f/u  . Depression    15    HPI Cathy Parrish presents for   Depression screen [facility] 2/9 12/05/2018 09/04/2018 09/15/2017 09/14/2017 09/14/2017  Decreased Interest 3 0 0 0 0  Down, Depressed, Hopeless 3 0 0 0 0  PHQ - 2 Score 6 0 0 0 0  Altered sleeping 0 - - - -  Tired, decreased energy 1 - - - -  Change in appetite 1 - - - -  Feeling bad or failure about yourself  3 - - - -  Trouble concentrating 2 - - - -  Moving slowly or fidgety/restless 1 - - - -  Suicidal thoughts 1 - - - -  PHQ-9 Score 15 - - - -  Difficult doing work/chores Not difficult at all - - - -    She reports that while she was being evaluated for breast cancer as well as dealing with the death of her father. She states that the medication helps her a little and she feels like her mood is improved about 75%. She states that she has good days and bad days. She is sleeping better now with the new medication Seroquel 1 tablet at bedtime 25mg  She reports that through her depression she has not had an appetite  Wt Readings from Last 3 Encounters:  12/05/18 269 lb (122 kg)  09/04/18 268 lb 12.8 oz (121.9 kg)  09/15/17 266 lb (120.7 kg)    Diabetes Mellitus: Patient presents for follow up of diabetes. Symptoms: hyperglycemia and dry mouth. Symptoms have gradually improved. Patient denies hypoglycemia , increase appetite, paresthesia of the feet and visual disturbances.  Evaluation to date has been included: hemoglobin A1C.  Home sugars: patient does not check sugars. Treatment to date: more intensive attention to diet which has been effective.   She typically has toast in the morning or oatmeal She is exercising and tries to get out for fresh air Lab Results  Component Value Date   HGBA1C 6.5 (A) 12/05/2018     Hypertension: Patient here for follow-up of elevated blood pressure. She is exercising and is adherent to low salt diet.  Blood pressure is well controlled at home. Cardiac symptoms none. Patient denies chest pressure/discomfort, claudication, exertional chest pressure/discomfort, lower extremity edema and near-syncope.  Cardiovascular risk factors: diabetes mellitus, hypertension and obesity (BMI >= 30 kg/m2). Use of agents associated with hypertension: none. History of target organ damage: none. BP Readings from Last 3 Encounters:  12/05/18 128/87  09/04/18 129/77  09/15/17 132/82    Past Medical History:  Diagnosis Date  . Asthma   . Depression   . Hypertension     Past Surgical History:  Procedure Laterality Date  . BREAST BIOPSY Right   . COLONOSCOPY  07/13/2011   Procedure: COLONOSCOPY;  Surgeon: Gatha Mayer, MD;  Location: [place];  Service: Endoscopy;  Laterality: N/A;  . DILATION AND CURETTAGE OF UTERUS  abortion  . DILITATION & CURRETTAGE/HYSTROSCOPY WITH NOVASURE ABLATION N/A 09/28/2012   Procedure: DILATATION & CURETTAGE/HYSTEROSCOPY WITH NOVASURE ABLATION; And Resectoscope;  Surgeon: Marvene Staff, MD;  Location: [facility];  Service: Gynecology;  Laterality: N/A;  . ESOPHAGOGASTRODUODENOSCOPY  07/13/2011   Procedure: ESOPHAGOGASTRODUODENOSCOPY (EGD);  Surgeon: Gatha Mayer, MD;  Location:  [place] [facility];  Service: Endoscopy;  Laterality: N/A;    Family History  Problem Relation Age of Onset  . Breast cancer Sister     Social History   Socioeconomic History  . Marital status: Single    Spouse name: Not on file  . Number of children: Not on file  . Years of education: Not on file  . Highest education level: Not on file  Occupational History  . Not on file  Social Needs  . Financial resource strain: Not on file  . Food insecurity    Worry: Not on file    Inability: Not on file  . Transportation needs    Medical: Not on file    Non-medical: Not on  file  Tobacco Use  . Smoking status: Never Smoker  . Smokeless tobacco: Never Used  Substance and Sexual Activity  . Alcohol use: Yes    Alcohol/week: 3.0 standard drinks    Types: 3 Cans of beer per week    Comment: twice a week  . Drug use: No  . Sexual activity: Yes    Birth control/protection: Condom  Lifestyle  . Physical activity    Days per week: Not on file    Minutes per session: Not on file  . Stress: Not on file  Relationships  . Social Herbalist on phone: Not on file    Gets together: Not on file    Attends religious service: Not on file    Active member of club or organization: Not on file    Attends meetings of clubs or organizations: Not on file    Relationship status: Not on file  . Intimate partner violence    Fear of current or ex partner: Not on file    Emotionally abused: Not on file    Physically abused: Not on file    Forced sexual activity: Not on file  Other Topics Concern  . Not on file  Social History Narrative  . Not on file    Outpatient Medications Prior to Visit  Medication Sig Dispense Refill  . albuterol (PROVENTIL HFA;VENTOLIN HFA) 108 (90 BASE) MCG/ACT inhaler Inhale 2 puffs into the lungs every 4 (four) hours as needed for wheezing. 1 Inhaler 0  . ALPRAZolam (XANAX) 0.5 MG tablet Take 1 tablet (0.5 mg total) by mouth at bedtime as needed for anxiety. 30 tablet 1  . Aspirin-Salicylamide-Caffeine (BC HEADACHE POWDER PO) Take 1 packet by mouth daily as needed (for headache).    . cyclobenzaprine (FLEXERIL) 10 MG tablet Take 1 tablet (10 mg total) by mouth 3 (three) times daily as needed for muscle spasms. 30 tablet 0  . dicyclomine (BENTYL) 10 MG capsule Take 10 mg by mouth 2 (two) times daily with a meal. 30 prior to meal    . fluticasone (FLONASE) 50 MCG/ACT nasal spray Place 2 sprays into both nostrils 2 (two) times daily. Decrease to 2 sprays/nostril daily after 5 days 16 g 2  . ibuprofen (ADVIL,MOTRIN) 200 MG tablet Take 4  tablets (800 mg total) by mouth every 6 (six) hours as needed for pain. 30 tablet 4  . irbesartan-hydrochlorothiazide (AVALIDE) 150-12.5 MG tablet TAKE 1 TABLET BY MOUTH ONCE DAILY 90 tablet 2  . SUMAtriptan (IMITREX) 50 MG tablet Take one tablet by mouth at the first sign of headache. May repeat in 2 hours if headache persists or recurs. 10 tablet 0  . Tetrahydrozoline HCl (VISINE OP) Apply 1 drop to eye as needed (for  allergies).    . Vitamin D, Ergocalciferol, (DRISDOL) 1.25 MG (50000 UT) CAPS capsule Take 1 capsule (50,000 Units total) by mouth every 7 (seven) days. 12 capsule 1  . QUEtiapine (SEROQUEL) 25 MG tablet Take 1-2 tablets (25-50 mg total) by mouth at bedtime. 30 tablet 3   No facility-administered medications prior to visit.     No Known Allergies  ROS Review of Systems Review of Systems  Constitutional: Negative for activity change, appetite change, chills and fever.  HENT: Negative for congestion, nosebleeds, trouble swallowing and voice change.   Respiratory: Negative for cough, shortness of breath and wheezing.   Gastrointestinal: Negative for diarrhea, nausea and vomiting.  Genitourinary: Negative for difficulty urinating, dysuria, flank pain and hematuria.  Musculoskeletal: Negative for back pain, joint swelling and neck pain.  Neurological: Negative for dizziness, speech difficulty, light-headedness and numbness.  See HPI. All other review of systems negative.     Objective:    Physical Exam  BP 128/87 (BP Location: Right Arm, Patient Position: Sitting, Cuff Size: Normal)   Pulse (!) 104   Temp 97.9 F (36.6 C) (Oral)   Resp 17   Ht 5\' 6"  (1.676 m)   Wt 269 lb (122 kg)   SpO2 97%   BMI 43.42 kg/m  Wt Readings from Last 3 Encounters:  12/05/18 269 lb (122 kg)  09/04/18 268 lb 12.8 oz (121.9 kg)  09/15/17 266 lb (120.7 kg)   Physical Exam  Constitutional: Oriented to person, place, and time. Appears well-developed and well-nourished.  HENT:  Head:  Normocephalic and atraumatic.  Eyes: Conjunctivae and EOM are normal.  Cardiovascular: Normal rate, regular rhythm, normal heart sounds and intact distal pulses.  No murmur heard. Pulmonary/Chest: Effort normal and breath sounds normal. No stridor. No respiratory distress. Has no wheezes.  Neurological: Is alert and oriented to person, place, and time.  Skin: Skin is warm. Capillary refill takes less than 2 seconds.  Psychiatric: Has a normal mood and affect. Behavior is normal. Judgment and thought content normal.    There are no preventive care reminders to display for this patient.  There are no preventive care reminders to display for this patient.  No results found for: TSH Lab Results  Component Value Date   WBC 7.1 09/04/2018   HGB 14.0 09/04/2018   HCT 42.5 09/04/2018   MCV 89 09/04/2018   PLT 347 09/04/2018   Lab Results  Component Value Date   NA 140 09/04/2018   K 4.2 09/04/2018   CO2 21 09/04/2018   GLUCOSE 138 (H) 09/04/2018   BUN 15 09/04/2018   CREATININE 0.70 09/04/2018   BILITOT 0.3 09/04/2018   ALKPHOS 119 (H) 09/04/2018   AST 21 09/04/2018   ALT 16 09/04/2018   PROT 7.3 09/04/2018   ALBUMIN 4.6 09/04/2018   CALCIUM 9.8 09/04/2018   Lab Results  Component Value Date   CHOL 221 (H) 12/05/2018   Lab Results  Component Value Date   HDL 51 12/05/2018   Lab Results  Component Value Date   LDLCALC 140 (H) 12/05/2018   Lab Results  Component Value Date   TRIG 168 (H) 12/05/2018   Lab Results  Component Value Date   CHOLHDL 4.3 12/05/2018   Lab Results  Component Value Date   HGBA1C 6.5 (A) 12/05/2018      Assessment & Plan:   Problem List Items Addressed This Visit      Cardiovascular and Mediastinum   Hypertension  - bp stable  Relevant Orders   Lipid panel (Completed)    Other Visit Diagnoses    Need for prophylactic vaccination and inoculation against influenza    -  Primary   Relevant Orders   Flu Vaccine QUAD 36+ mos IM  (Completed)   Newly diagnosed diabetes ([place])    - diabetes well controlled, provided education and reviewed standards of care    Relevant Orders   POCT glycosylated hemoglobin (Hb A1C) (Completed)    Stress -  Discussed her breast cancer screening and reviewed pathology report. Discussed resources for stress management and resources for dealing with grief and loss  Abnormal mammogram-  Biopsy was benign, reviewed with patient.   Meds ordered this encounter  Medications  . QUEtiapine (SEROQUEL) 25 MG tablet    Sig: Take 1-2 tablets (25-50 mg total) by mouth at bedtime.    Dispense:  90 tablet    Refill:  3    Follow-up: No follow-ups on file.    Forrest Moron, MD

## 2018-12-05 NOTE — Patient Instructions (Signed)
° ° ° °  If you have lab work done today you will be contacted with your lab results within the next 2 weeks.  If you have not heard from us then please contact us. The fastest way to get your results is to register for My Chart. ° ° °IF you received an x-ray today, you will receive an invoice from [facility]. Please contact [facility] at [phone] with questions or concerns regarding your invoice.  ° °IF you received labwork today, you will receive an invoice from [facility]. Please contact [facility] at [phone] with questions or concerns regarding your invoice.  ° °Our billing staff will not be able to assist you with questions regarding bills from these companies. ° °You will be contacted with the lab results as soon as they are available. The fastest way to get your results is to activate your My Chart account. Instructions are located on the last page of this paperwork. If you have not heard from us regarding the results in 2 weeks, please contact this office. °  ° ° ° °

## 2018-12-06 LAB — LIPID PANEL
Chol/HDL Ratio: 4.3 ratio (ref 0.0–4.4)
Cholesterol, Total: 221 mg/dL — ABNORMAL HIGH (ref 100–199)
HDL: 51 mg/dL (ref >39)
LDL Chol Calc (NIH): 140 mg/dL — ABNORMAL HIGH (ref 0–99)
Triglycerides: 168 mg/dL — ABNORMAL HIGH (ref 0–149)
VLDL Cholesterol Cal: 30 mg/dL (ref 5–40)

## 2018-12-10 MED ORDER — ATORVASTATIN CALCIUM 20 MG PO TABS: 20.0000 mg | ORAL_TABLET | Freq: Every day | ORAL | 3 refills | Status: AC

## 2019-02-28 ENCOUNTER — Other Ambulatory Visit: Payer: Self-pay | Admitting: Family Medicine

## 2019-02-28 NOTE — Telephone Encounter (Signed)
Requested medication (s) are due for refill today: yes  Requested medication (s) are on the active medication list: yes  Last refill: 09/04/2018  Future visit scheduled: yes  Notes to clinic:  Not delegated    Requested Prescriptions  Pending Prescriptions Disp Refills   cyclobenzaprine (FLEXERIL) 10 MG tablet [Pharmacy Med Name: CYCLOBENZAPRINE 10MG  TABLETS] 30 tablet 0    Sig: TAKE 1 TABLET(10 MG) BY MOUTH THREE TIMES DAILY AS NEEDED FOR MUSCLE SPASMS      Not Delegated - Analgesics:  Muscle Relaxants Failed - 02/28/2019  3:29 PM      Failed - This refill cannot be delegated      Passed - Valid encounter within last 6 months    Recent Outpatient Visits           2 months ago Need for prophylactic vaccination and inoculation against influenza   Primary Care at [facility], Zoe A, MD   5 months ago Newly diagnosed diabetes [facility])   Primary Care at [facility], Arlie Solomons, MD   1 year ago Acute otitis externa of left ear, unspecified type   Primary Care at [facility], [place] D, PA-C   1 year ago Encounter for health maintenance examination in adult   Primary Care at [facility], Arlie Solomons, MD   1 year ago Essential hypertension   Primary Care at [place], MD       Future Appointments             In 6 days Forrest Moron, MD Primary Care at [place], [facility]

## 2019-02-28 NOTE — Telephone Encounter (Signed)
Pt wants a refill on her Flexeril 10 mg.  Pt last seen on 12/05/2018 Next appt is on 03/06/2019  Medication was last filled on 09/04/2018.  Please advise.

## 2019-03-06 ENCOUNTER — Ambulatory Visit: Payer: BC Managed Care – PPO | Admitting: Family Medicine

## 2019-03-06 ENCOUNTER — Encounter: Payer: Self-pay | Admitting: Family Medicine

## 2019-03-06 ENCOUNTER — Other Ambulatory Visit: Payer: Self-pay

## 2019-03-06 VITALS — BP 124/82 | HR 90 | Temp 98.0°F | Resp 17 | Ht 66.0 in | Wt 270.4 lb

## 2019-03-06 DIAGNOSIS — R350 Frequency of micturition: Secondary | ICD-10-CM

## 2019-03-06 DIAGNOSIS — E119 Type 2 diabetes mellitus without complications: Secondary | ICD-10-CM | POA: Diagnosis not present

## 2019-03-06 DIAGNOSIS — E559 Vitamin D deficiency, unspecified: Secondary | ICD-10-CM

## 2019-03-06 DIAGNOSIS — I10 Essential (primary) hypertension: Secondary | ICD-10-CM

## 2019-03-06 LAB — POCT URINALYSIS DIP (MANUAL ENTRY)
Bilirubin, UA: NEGATIVE
Glucose, UA: NEGATIVE mg/dL
Ketones, POC UA: NEGATIVE mg/dL
Leukocytes, UA: NEGATIVE
Nitrite, UA: NEGATIVE
Protein Ur, POC: NEGATIVE mg/dL
Spec Grav, UA: 1.025 (ref 1.010–1.025)
Urobilinogen, UA: 0.2 E.U./dL
pH, UA: 5 (ref 5.0–8.0)

## 2019-03-06 LAB — POCT GLYCOSYLATED HEMOGLOBIN (HGB A1C): Hemoglobin A1C: 6.4 % — AB (ref 4.0–5.6)

## 2019-03-06 MED ORDER — VITAMIN D (ERGOCALCIFEROL) 1.25 MG (50000 UNIT) PO CAPS: 50000.0000 [IU] | ORAL_CAPSULE | ORAL | 1 refills | Status: DC

## 2019-03-06 NOTE — Patient Instructions (Addendum)
Component     Latest Ref Rng & Units 09/14/2017 09/04/2018 12/05/2018  Hemoglobin A1C     4.0 - 5.6 % 6.6 (H) 6.6 (H) 6.5 (A)  Est. average glucose Bld gHb Est-mCnc     mg/dL 143 143    Component     Latest Ref Rng & Units 03/06/2019  Hemoglobin A1C     4.0 - 5.6 % 6.4 (A)  Est. average glucose Bld gHb Est-mCnc     mg/dL     Your a1c is 6.4% today.  Continue a healthy diet with exercise.  You can do this!!  Keep up the good work.    Your next appointment will be in 3 months   If you have lab work done today you will be contacted with your lab results within the next 2 weeks.  If you have not heard from [place] then please contact us. The fastest way to get your results is to register for My Chart.   IF you received an x-ray today, you will receive an invoice from [facility]. Please contact [facility] at [phone] with questions or concerns regarding your invoice.   IF you received labwork today, you will receive an invoice from [place]. Please contact [facility] at [phone] with questions or concerns regarding your invoice.   Our billing staff will not be able to assist you with questions regarding bills from these companies.  You will be contacted with the lab results as soon as they are available. The fastest way to get your results is to activate your My Chart account. Instructions are located on the last page of this paperwork. If you have not heard from [place] regarding the results in 2 weeks, please contact this office.     Diabetes Mellitus and Exercise Exercising regularly is important for your overall health, especially when you have diabetes (diabetes mellitus). Exercising is not only about losing weight. It has many other health benefits, such as increasing muscle strength and bone density and reducing body fat and stress. This leads to improved fitness, flexibility, and endurance, all of which result in better overall health. Exercise has additional  benefits for people with diabetes, including:  Reducing appetite.  Helping to lower and control blood glucose.  Lowering blood pressure.  Helping to control amounts of fatty substances (lipids) in the blood, such as cholesterol and triglycerides.  Helping the body to respond better to insulin (improving insulin sensitivity).  Reducing how much insulin the body needs.  Decreasing the risk for heart disease by: ? Lowering cholesterol and triglyceride levels. ? Increasing the levels of good cholesterol. ? Lowering blood glucose levels. What is my activity plan? Your health care provider or certified diabetes educator can help you make a plan for the type and frequency of exercise (activity plan) that works for you. Make sure that you:  Do at least 150 minutes of moderate-intensity or vigorous-intensity exercise each week. This could be brisk walking, biking, or water aerobics. ? Do stretching and strength exercises, such as yoga or weightlifting, at least 2 times a week. ? Spread out your activity over at least 3 days of the week.  Get some form of physical activity every day. ? Do not go more than 2 days in a row without some kind of physical activity. ? Avoid being inactive for more than 30 minutes at a time. Take frequent breaks to walk or stretch.  Choose a type of exercise or activity that you enjoy, and set realistic goals.  Start slowly, and gradually increase the intensity of your exercise over time. What do I need to know about managing my diabetes?   Check your blood glucose before and after exercising. ? If your blood glucose is 240 mg/dL (13.3 mmol/L) or higher before you exercise, check your urine for ketones. If you have ketones in your urine, do not exercise until your blood glucose returns to normal. ? If your blood glucose is 100 mg/dL (5.6 mmol/L) or lower, eat a snack containing 15-20 grams of carbohydrate. Check your blood glucose 15 minutes after the snack to  make sure that your level is above 100 mg/dL (5.6 mmol/L) before you start your exercise.  Know the symptoms of low blood glucose (hypoglycemia) and how to treat it. Your risk for hypoglycemia increases during and after exercise. Common symptoms of hypoglycemia can include: ? Hunger. ? Anxiety. ? Sweating and feeling clammy. ? Confusion. ? Dizziness or feeling light-headed. ? Increased heart rate or palpitations. ? Blurry vision. ? Tingling or numbness around the mouth, lips, or tongue. ? Tremors or shakes. ? Irritability.  Keep a rapid-acting carbohydrate snack available before, during, and after exercise to help prevent or treat hypoglycemia.  Avoid injecting insulin into areas of the body that are going to be exercised. For example, avoid injecting insulin into: ? The arms, when playing tennis. ? The legs, when jogging.  Keep records of your exercise habits. Doing this can help you and your health care provider adjust your diabetes management plan as needed. Write down: ? Food that you eat before and after you exercise. ? Blood glucose levels before and after you exercise. ? The type and amount of exercise you have done. ? When your insulin is expected to peak, if you use insulin. Avoid exercising at times when your insulin is peaking.  When you start a new exercise or activity, work with your health care provider to make sure the activity is safe for you, and to adjust your insulin, medicines, or food intake as needed.  Drink plenty of water while you exercise to prevent dehydration or heat stroke. Drink enough fluid to keep your urine clear or pale yellow. Summary  Exercising regularly is important for your overall health, especially when you have diabetes (diabetes mellitus).  Exercising has many health benefits, such as increasing muscle strength and bone density and reducing body fat and stress.  Your health care provider or certified diabetes educator can help you make a  plan for the type and frequency of exercise (activity plan) that works for you.  When you start a new exercise or activity, work with your health care provider to make sure the activity is safe for you, and to adjust your insulin, medicines, or food intake as needed. This information is not intended to replace advice given to you by your health care provider. Make sure you discuss any questions you have with your health care provider. Document Revised: 09/09/2016 Document Reviewed: 07/28/2015 Elsevier Patient Education  [place].

## 2019-03-06 NOTE — Progress Notes (Signed)
Established Patient Office Visit  Subjective:  Patient ID: Cathy Parrish, female    DOB: 1970/04/28  Age: 49 y.o. MRN: [id number]  CC:  Chief Complaint  Patient presents with  . Diabetes    3 month recheck.  Needs vitamin d sent to pharmacy as it was not sent at last visit    HPI Cathy Parrish presents for   Diabetes Mellitus: Patient presents for follow up of diabetes. Symptoms: none.  Patient denies hypoglycemia , increase appetite, nausea and paresthesia of the feet.  Evaluation to date has been included: hemoglobin A1C.  Home sugars: patient does not check sugars.  Lab Results  Component Value Date   HGBA1C 6.5 (A) 12/05/2018   Morbid Obesity Pt is not yet losing weight She is changing how she is eating She plans to exercise She denies chest pains, palpitations, snoring  Wt Readings from Last 3 Encounters:  03/06/19 270 lb 6.4 oz (122.7 kg)  12/05/18 269 lb (122 kg)  09/04/18 268 lb 12.8 oz (121.9 kg)    Hypertension: Patient here for follow-up of elevated blood pressure. She is exercising and is adherent to low salt diet.  Blood pressure is well controlled at home. Cardiac symptoms none. Patient denies chest pain, chest pressure/discomfort, dyspnea, exertional chest pressure/discomfort and fatigue.  Cardiovascular risk factors: diabetes mellitus, dyslipidemia and hypertension. Use of agents associated with hypertension: none. History of target organ damage: none. Wt Readings from Last 3 Encounters:  03/06/19 270 lb 6.4 oz (122.7 kg)  12/05/18 269 lb (122 kg)  09/04/18 268 lb 12.8 oz (121.9 kg)     Past Medical History:  Diagnosis Date  . Asthma   . Depression   . Hypertension     Past Surgical History:  Procedure Laterality Date  . BREAST BIOPSY Right   . COLONOSCOPY  07/13/2011   Procedure: COLONOSCOPY;  Surgeon: Gatha Mayer, MD;  Location: [place];  Service: Endoscopy;  Laterality: N/A;  . DILATION AND CURETTAGE OF UTERUS  abortion  .  DILITATION & CURRETTAGE/HYSTROSCOPY WITH NOVASURE ABLATION N/A 09/28/2012   Procedure: DILATATION & CURETTAGE/HYSTEROSCOPY WITH NOVASURE ABLATION; And Resectoscope;  Surgeon: Marvene Staff, MD;  Location: [facility];  Service: Gynecology;  Laterality: N/A;  . ESOPHAGOGASTRODUODENOSCOPY  07/13/2011   Procedure: ESOPHAGOGASTRODUODENOSCOPY (EGD);  Surgeon: Gatha Mayer, MD;  Location: [facility] ENDOSCOPY;  Service: Endoscopy;  Laterality: N/A;    Family History  Problem Relation Age of Onset  . Breast cancer Sister     Social History   Socioeconomic History  . Marital status: Single    Spouse name: Not on file  . Number of children: Not on file  . Years of education: Not on file  . Highest education level: Not on file  Occupational History  . Not on file  Tobacco Use  . Smoking status: Never Smoker  . Smokeless tobacco: Never Used  Substance and Sexual Activity  . Alcohol use: Yes    Alcohol/week: 3.0 standard drinks    Types: 3 Cans of beer per week    Comment: twice a week  . Drug use: No  . Sexual activity: Yes    Birth control/protection: Condom  Other Topics Concern  . Not on file  Social History Narrative  . Not on file   Social Determinants of Health   Financial Resource Strain:   . Difficulty of Paying Living Expenses: Not on file  Food Insecurity:   . Worried About Charity fundraiser in the  Last Year: Not on file  . Ran Out of Food in the Last Year: Not on file  Transportation Needs:   . Lack of Transportation (Medical): Not on file  . Lack of Transportation (Non-Medical): Not on file  Physical Activity:   . Days of Exercise per Week: Not on file  . Minutes of Exercise per Session: Not on file  Stress:   . Feeling of Stress : Not on file  Social Connections:   . Frequency of Communication with Friends and Family: Not on file  . Frequency of Social Gatherings with Friends and Family: Not on file  . Attends Religious Services: Not on file  . Active Member of  Clubs or Organizations: Not on file  . Attends Archivist Meetings: Not on file  . Marital Status: Not on file  Intimate Partner Violence:   . Fear of Current or Ex-Partner: Not on file  . Emotionally Abused: Not on file  . Physically Abused: Not on file  . Sexually Abused: Not on file    Outpatient Medications Prior to Visit  Medication Sig Dispense Refill  . albuterol (PROVENTIL HFA;VENTOLIN HFA) 108 (90 BASE) MCG/ACT inhaler Inhale 2 puffs into the lungs every 4 (four) hours as needed for wheezing. 1 Inhaler 0  . ALPRAZolam (XANAX) 0.5 MG tablet Take 1 tablet (0.5 mg total) by mouth at bedtime as needed for anxiety. 30 tablet 1  . Aspirin-Salicylamide-Caffeine (BC HEADACHE POWDER PO) Take 1 packet by mouth daily as needed (for headache).    Marland Kitchen atorvastatin (LIPITOR) 20 MG tablet Take 1 tablet (20 mg total) by mouth daily. 90 tablet 3  . cyclobenzaprine (FLEXERIL) 10 MG tablet TAKE 1 TABLET(10 MG) BY MOUTH THREE TIMES DAILY AS NEEDED FOR MUSCLE SPASMS 30 tablet 0  . dicyclomine (BENTYL) 10 MG capsule Take 10 mg by mouth 2 (two) times daily with a meal. 30 prior to meal    . fluticasone (FLONASE) 50 MCG/ACT nasal spray Place 2 sprays into both nostrils 2 (two) times daily. Decrease to 2 sprays/nostril daily after 5 days 16 g 2  . ibuprofen (ADVIL,MOTRIN) 200 MG tablet Take 4 tablets (800 mg total) by mouth every 6 (six) hours as needed for pain. 30 tablet 4  . irbesartan-hydrochlorothiazide (AVALIDE) 150-12.5 MG tablet TAKE 1 TABLET BY MOUTH ONCE DAILY 90 tablet 2  . QUEtiapine (SEROQUEL) 25 MG tablet Take 1-2 tablets (25-50 mg total) by mouth at bedtime. 90 tablet 3  . SUMAtriptan (IMITREX) 50 MG tablet Take one tablet by mouth at the first sign of headache. May repeat in 2 hours if headache persists or recurs. 10 tablet 0  . Tetrahydrozoline HCl (VISINE OP) Apply 1 drop to eye as needed (for allergies).    . Vitamin D, Ergocalciferol, (DRISDOL) 1.25 MG (50000 UT) CAPS capsule Take  1 capsule (50,000 Units total) by mouth every 7 (seven) days. 12 capsule 1   No facility-administered medications prior to visit.    No Known Allergies  ROS Review of Systems Review of Systems  Constitutional: Negative for activity change, appetite change, chills and fever.  HENT: Negative for congestion, nosebleeds, trouble swallowing and voice change.   Respiratory: Negative for cough, shortness of breath and wheezing.   Gastrointestinal: Negative for diarrhea, nausea and vomiting.  Genitourinary: Negative for difficulty urinating, dysuria, flank pain and hematuria.  Musculoskeletal: Negative for back pain, joint swelling and neck pain.  Neurological: Negative for dizziness, speech difficulty, light-headedness and numbness.  See HPI. All other review  of systems negative.     Objective:    Physical Exam  BP 124/82 (BP Location: Right Arm, Patient Position: Sitting, Cuff Size: Large)   Pulse 90   Temp 98 F (36.7 C) (Oral)   Resp 17   Ht 5\' 6"  (1.676 m)   Wt 270 lb 6.4 oz (122.7 kg)   LMP  (Exact Date)   SpO2 98%   BMI 43.64 kg/m  Wt Readings from Last 3 Encounters:  03/06/19 270 lb 6.4 oz (122.7 kg)  12/05/18 269 lb (122 kg)  09/04/18 268 lb 12.8 oz (121.9 kg)    Physical Exam  Constitutional: Oriented to person, place, and time. Appears well-developed and well-nourished.  HENT:  Head: Normocephalic and atraumatic.  Eyes: Conjunctivae and EOM are normal.  Cardiovascular: Normal rate, regular rhythm, normal heart sounds and intact distal pulses.  No murmur heard. Pulmonary/Chest: Effort normal and breath sounds normal. No stridor. No respiratory distress. Has no wheezes.  Neurological: Is alert and oriented to person, place, and time.  Skin: Skin is warm. Capillary refill takes less than 2 seconds.  Psychiatric: Has a normal mood and affect. Behavior is normal. Judgment and thought content normal.    There are no preventive care reminders to display for this  patient.  There are no preventive care reminders to display for this patient.  No results found for: TSH Lab Results  Component Value Date   WBC 7.1 09/04/2018   HGB 14.0 09/04/2018   HCT 42.5 09/04/2018   MCV 89 09/04/2018   PLT 347 09/04/2018   Lab Results  Component Value Date   NA 140 09/04/2018   K 4.2 09/04/2018   CO2 21 09/04/2018   GLUCOSE 138 (H) 09/04/2018   BUN 15 09/04/2018   CREATININE 0.70 09/04/2018   BILITOT 0.3 09/04/2018   ALKPHOS 119 (H) 09/04/2018   AST 21 09/04/2018   ALT 16 09/04/2018   PROT 7.3 09/04/2018   ALBUMIN 4.6 09/04/2018   CALCIUM 9.8 09/04/2018   Lab Results  Component Value Date   CHOL 221 (H) 12/05/2018   Lab Results  Component Value Date   HDL 51 12/05/2018   Lab Results  Component Value Date   LDLCALC 140 (H) 12/05/2018   Lab Results  Component Value Date   TRIG 168 (H) 12/05/2018   Lab Results  Component Value Date   CHOLHDL 4.3 12/05/2018   Lab Results  Component Value Date   HGBA1C 6.5 (A) 12/05/2018      Assessment & Plan:   Problem List Items Addressed This Visit      Cardiovascular and Mediastinum   Hypertension - Primary bp stable, continue DASH diet   Relevant Orders   Lipid panel   CMET with GFR    Other Visit Diagnoses    Morbid obesity ([place])    - discussed weight loss to decrease complications of obesity   Relevant Orders   POCT glycosylated hemoglobin (Hb A1C)   Newly diagnosed diabetes ([place])    - will continue to provide education   Relevant Orders   POCT glycosylated hemoglobin (Hb A1C)   CMET with GFR   Urinary frequency       Relevant Orders   POCT urine dipstick   Vitamin D deficiency    - will check    Relevant Orders   Vitamin D 25 (osteoporosis screening)      No orders of the defined types were placed in this encounter.   Follow-up: No follow-ups on  file.    Forrest Moron, MD

## 2019-03-07 LAB — CMP14+EGFR
ALT: 11 IU/L (ref 0–32)
AST: 9 IU/L (ref 0–40)
Albumin/Globulin Ratio: 1.4 (ref 1.2–2.2)
Albumin: 4.2 g/dL (ref 3.8–4.8)
Alkaline Phosphatase: 117 IU/L (ref 39–117)
BUN/Creatinine Ratio: 24 — ABNORMAL HIGH (ref 9–23)
BUN: 15 mg/dL (ref 6–24)
Bilirubin Total: 0.4 mg/dL (ref 0.0–1.2)
CO2: 23 mmol/L (ref 20–29)
Calcium: 9.5 mg/dL (ref 8.7–10.2)
Chloride: 101 mmol/L (ref 96–106)
Creatinine, Ser: 0.63 mg/dL (ref 0.57–1.00)
GFR calc Af Amer: 121 mL/min/{1.73_m2} (ref >59)
GFR calc non Af Amer: 105 mL/min/{1.73_m2} (ref >59)
Globulin, Total: 3.1 g/dL (ref 1.5–4.5)
Glucose: 141 mg/dL — ABNORMAL HIGH (ref 65–99)
Potassium: 4.2 mmol/L (ref 3.5–5.2)
Sodium: 138 mmol/L (ref 134–144)
Total Protein: 7.3 g/dL (ref 6.0–8.5)

## 2019-03-07 LAB — VITAMIN D 25 HYDROXY (VIT D DEFICIENCY, FRACTURES): Vit D, 25-Hydroxy: 4.4 ng/mL — ABNORMAL LOW (ref 30.0–100.0)

## 2019-03-07 LAB — LIPID PANEL
Chol/HDL Ratio: 3.4 ratio (ref 0.0–4.4)
Cholesterol, Total: 181 mg/dL (ref 100–199)
HDL: 53 mg/dL (ref >39)
LDL Chol Calc (NIH): 105 mg/dL — ABNORMAL HIGH (ref 0–99)
Triglycerides: 130 mg/dL (ref 0–149)
VLDL Cholesterol Cal: 23 mg/dL (ref 5–40)

## 2019-05-09 ENCOUNTER — Other Ambulatory Visit: Payer: Self-pay | Admitting: Family Medicine

## 2019-05-09 NOTE — Telephone Encounter (Signed)
Requested medication (s) are due for refill today: Yes  Requested medication (s) are on the active medication list: Yes  Last refill:  09/04/18  Future visit scheduled: Yes  Notes to clinic:  See request.    Requested Prescriptions  Pending Prescriptions Disp Refills   ALPRAZolam (XANAX) 0.5 MG tablet [Pharmacy Med Name: ALPRAZOLAM 0.5MG  TABLETS] 30 tablet     Sig: TAKE 1 TABLET(0.5 MG) BY MOUTH AT BEDTIME AS NEEDED FOR ANXIETY      Not Delegated - Psychiatry:  Anxiolytics/Hypnotics Failed - 05/09/2019  2:47 PM      Failed - This refill cannot be delegated      Failed - Urine Drug Screen completed in last 360 days.      Passed - Valid encounter within last 6 months    Recent Outpatient Visits           2 months ago Essential hypertension   Primary Care at [facility], [place] A, MD   5 months ago Need for prophylactic vaccination and inoculation against influenza   Primary Care at [place], MD   8 months ago Newly diagnosed diabetes [facility])   Primary Care at [facility], Arlie Solomons, MD   1 year ago Acute otitis externa of left ear, unspecified type   Primary Care at [facility], [place] D, PA-C   1 year ago Encounter for health maintenance examination in adult   Primary Care at [place], MD       Future Appointments             In 3 weeks Forrest Moron, MD Primary Care at [place], [facility]

## 2019-05-09 NOTE — Telephone Encounter (Signed)
Patient is requesting a refill of the following medications: Requested Prescriptions   Pending Prescriptions Disp Refills  . ALPRAZolam (XANAX) 0.5 MG tablet [Pharmacy Med Name: ALPRAZOLAM 0.5MG  TABLETS] 30 tablet     Sig: TAKE 1 TABLET(0.5 MG) BY MOUTH AT BEDTIME AS NEEDED FOR ANXIETY    Date of patient request: 05/09/2019 Last office visit: 03/06/2019 Date of last refill:09/04/2018 Last refill amount:30 Follow up time period per chart: n/a

## 2019-06-05 ENCOUNTER — Ambulatory Visit: Payer: BC Managed Care – PPO | Admitting: Family Medicine

## 2019-06-16 ENCOUNTER — Other Ambulatory Visit: Payer: Self-pay | Admitting: Family Medicine

## 2019-06-16 NOTE — Telephone Encounter (Signed)
Requested  medications are  due for refill today no  Requested medications are on the active medication list yes  Last refill 05/18/19  Future visit scheduled no  Notes to clinic not delegated

## 2019-06-17 NOTE — Telephone Encounter (Signed)
Patient is requesting a refill of the following medications: Requested Prescriptions   Pending Prescriptions Disp Refills  . QUEtiapine (SEROQUEL) 25 MG tablet [Pharmacy Med Name: QUETIAPINE 25MG  TABLETS] 90 tablet 3    Sig: TAKE 1 TO 2 TABLETS(25 TO 50 MG) BY MOUTH AT BEDTIME    Date of patient request:06/16/2019 Last office visit: 03/06/2019 Date of last refill: 12/05/2018 Last refill amount: 90 Follow up time period per chart: n/a

## 2019-06-20 ENCOUNTER — Ambulatory Visit: Payer: BC Managed Care – PPO | Admitting: Family Medicine

## 2019-06-20 ENCOUNTER — Encounter: Payer: Self-pay | Admitting: Family Medicine

## 2019-06-20 ENCOUNTER — Other Ambulatory Visit: Payer: Self-pay

## 2019-06-20 VITALS — BP 130/78 | HR 68 | Temp 98.2°F | Resp 16 | Ht 66.0 in | Wt 273.6 lb

## 2019-06-20 DIAGNOSIS — E1165 Type 2 diabetes mellitus with hyperglycemia: Secondary | ICD-10-CM | POA: Diagnosis not present

## 2019-06-20 DIAGNOSIS — E559 Vitamin D deficiency, unspecified: Secondary | ICD-10-CM

## 2019-06-20 DIAGNOSIS — I10 Essential (primary) hypertension: Secondary | ICD-10-CM

## 2019-06-20 DIAGNOSIS — R7303 Prediabetes: Secondary | ICD-10-CM

## 2019-06-20 DIAGNOSIS — Z13220 Encounter for screening for lipoid disorders: Secondary | ICD-10-CM | POA: Diagnosis not present

## 2019-06-20 MED ORDER — METFORMIN HCL 500 MG PO TABS: 500.0000 mg | ORAL_TABLET | Freq: Two times a day (BID) | ORAL | 0 refills | Status: DC

## 2019-06-20 NOTE — Patient Instructions (Addendum)
Read about Saxenda online  At [email or web address]   Low Glycemic Foods (20-49)  Breakfast Cereals: All-Bran                All-Bran Fruit ' n Oats Fiber One               Oatmeal (not instant)  Oat bran Fruits and fruit juices: (Limit to 1-2 servings per day) Apples               Apricots (fresh & dried)  Blackberries            Blueberries Cherries                  Cranberries             Peaches                  Pears                       Plums                       Prunes Grapefruit                Raspberries            Strawberries           Tangerine Apple juice             Grapefruit juice Tomato juice  Beans and legumes (fresh-cooked): Black-eyed peas     Butter beans Chick peas              Lentils     Green beans           Lima beans               Kidney beans          Navy beans  Non-starchy vegetables: Asparagus, bok choy, broccoli, cabbage, cauliflower, celery, cucumber, greens, lettuce, mushrooms, peppers, tomatoes, okra, onions, snow peas, spinach, summer squash  Grains: Barley                                Bulgur Rye                                    Wild rice  Nuts and oils : Almonds         Peanuts     Sunflower seeds  Hazelnuts      Pecans          Walnuts Oils that are liquid at room temperature  Dairy, fish, and meat: Milk, skim                         Lowfat cheese Yogurt, lowfat, fruit sugar sweetened Lean red meat                      Fish  Skinless chicken & [place]    Shellfish Moderate Glycemic Foods (50-69)  Breakfast Cereals: Bran Buds                             Bran Chex Just Right  Mini-Wheats Special K         Swiss muesli  Fruits: Banana (under-ripe)             Dates Figs                                      Grapes Kiwi                                      Mango Oranges                               Raisins  Fruit Juices: Cranberry juice                    Orange juice  Beans and  legumes: [place]-type baked beans Canned pinto, kidney, or navy beans Green peas  Vegetables: Beets                         Raw Carrots  Sweet potato              Yam Corn on the cob  Breads: Pita (pocket) bread          Oat bran bread Pumpernickel bread           Rye bread Wheat bread, high fiber       Grains: Cornmeal                           Rice, brown   Rice, white                         Couscous Pasta: Macaroni                           Pizza  cheese Raviolimeat filled           Spaghetti, white        Nuts: Cashews                           Macadamia  Snacks: Chocolate                    Ice cream,lowfat  Muffin                               Popcorn High Glycemic Foods (70-100)   Breakfast Cereals: Cheerios                 Corn Chex Corn Flakes            Cream of Wheat Grape Nuts              Grape Nut Flakes Life                 Nutri-Grain       Puffed Rice               Puffed Wheat Rice Chex                 Rice Krispies Shredded Wheat  Team Total Fruits: Pineapple                 Watermelon Banana (over-ripe)  Beverages: Sodas, sweet tea, pineapple juice  Vegetables: Potato, baked, boiled, fried, mashed [place] fries Canned or frozen corn Cooked carrots Parsnips Winter squash  Breads: Most breads (white and whole grain) Bagels                     Bread sticks Bread stuffing          Kaiser roll Dinner rolls  Grains: Rice, instant          Tapioca, with milk  Candy and most cookies Snacks: Donuts                      Corn chips        Jelly beans                 Pretzels Pastries                             Restaurant and ethnic foods Most [place] food (sugar in stir fry or wok sauces) Teriyaki-style meats and vegetables    If you have lab work done today you will be contacted with your lab results within the next 2 weeks.  If you have not heard from [place] then please contact us. The fastest way to get your results  is to register for My Chart.   IF you received an x-ray today, you will receive an invoice from [facility]. Please contact [facility] at [phone] with questions or concerns regarding your invoice.   IF you received labwork today, you will receive an invoice from [place]. Please contact [facility] at [phone] with questions or concerns regarding your invoice.   Our billing staff will not be able to assist you with questions regarding bills from these companies.  You will be contacted with the lab results as soon as they are available. The fastest way to get your results is to activate your My Chart account. Instructions are located on the last page of this paperwork. If you have not heard from [place] regarding the results in 2 weeks, please contact this office.

## 2019-06-20 NOTE — Progress Notes (Signed)
Established Patient Office Visit  Subjective:  Patient ID: Cathy Parrish, female    DOB: March 01, 1971  Age: 49 y.o. MRN: [id number]  CC:  Chief Complaint  Patient presents with  . Diabetes    3 month f/u  . Medication Refill    cyclobenzaprine    HPI Cathy Parrish presents for   Diabetes Mellitus: Patient presents for follow up of diabetes. Symptoms: hyperglycemia. Symptoms have stabilized. Patient denies hypoglycemia , increase appetite, paresthesia of the feet, polyuria and visual disturbances.  Evaluation to date has been included: hemoglobin A1C.  Home sugars: patient does not check sugars. Treatment to date: more intensive attention to diet which has been ineffective.   Lab Results  Component Value Date   HGBA1C 6.4 (A) 03/06/2019   She is working hard to lose weight She feels like she is always hungry She is cutting back  Wt Readings from Last 3 Encounters:  06/20/19 273 lb 9.6 oz (124.1 kg)  03/06/19 270 lb 6.4 oz (122.7 kg)  12/05/18 269 lb (122 kg)   Body mass index is 44.16 kg/m.     Past Medical History:  Diagnosis Date  . Asthma   . Depression   . Hypertension     Past Surgical History:  Procedure Laterality Date  . BREAST BIOPSY Right   . COLONOSCOPY  07/13/2011   Procedure: COLONOSCOPY;  Surgeon: Gatha Mayer, MD;  Location: [facility];  Service: Endoscopy;  Laterality: N/A;  . DILATION AND CURETTAGE OF UTERUS  abortion  . DILITATION & CURRETTAGE/HYSTROSCOPY WITH NOVASURE ABLATION N/A 09/28/2012   Procedure: DILATATION & CURETTAGE/HYSTEROSCOPY WITH NOVASURE ABLATION; And Resectoscope;  Surgeon: Marvene Staff, MD;  Location: [facility];  Service: Gynecology;  Laterality: N/A;  . ESOPHAGOGASTRODUODENOSCOPY  07/13/2011   Procedure: ESOPHAGOGASTRODUODENOSCOPY (EGD);  Surgeon: Gatha Mayer, MD;  Location: [facility];  Service: Endoscopy;  Laterality: N/A;    Family History  Problem Relation Age of Onset  . Breast cancer Sister      Social History   Socioeconomic History  . Marital status: Single    Spouse name: Not on file  . Number of children: Not on file  . Years of education: Not on file  . Highest education level: Not on file  Occupational History  . Not on file  Tobacco Use  . Smoking status: Never Smoker  . Smokeless tobacco: Never Used  Substance and Sexual Activity  . Alcohol use: Yes    Alcohol/week: 3.0 standard drinks    Types: 3 Cans of beer per week    Comment: twice a week  . Drug use: No  . Sexual activity: Yes    Birth control/protection: Condom  Other Topics Concern  . Not on file  Social History Narrative  . Not on file   Social Determinants of Health   Financial Resource Strain:   . Difficulty of Paying Living Expenses:   Food Insecurity:   . Worried About Charity fundraiser in the Last Year:   . Arboriculturist in the Last Year:   Transportation Needs:   . Film/video editor (Medical):   Marland Kitchen Lack of Transportation (Non-Medical):   Physical Activity:   . Days of Exercise per Week:   . Minutes of Exercise per Session:   Stress:   . Feeling of Stress :   Social Connections:   . Frequency of Communication with Friends and Family:   . Frequency of Social Gatherings with Friends and Family:   .  Attends Religious Services:   . Active Member of Clubs or Organizations:   . Attends Archivist Meetings:   Marland Kitchen Marital Status:   Intimate Partner Violence:   . Fear of Current or Ex-Partner:   . Emotionally Abused:   Marland Kitchen Physically Abused:   . Sexually Abused:     Outpatient Medications Prior to Visit  Medication Sig Dispense Refill  . albuterol (PROVENTIL HFA;VENTOLIN HFA) 108 (90 BASE) MCG/ACT inhaler Inhale 2 puffs into the lungs every 4 (four) hours as needed for wheezing. 1 Inhaler 0  . ALPRAZolam (XANAX) 0.5 MG tablet TAKE 1 TABLET(0.5 MG) BY MOUTH AT BEDTIME AS NEEDED FOR ANXIETY 30 tablet 0  . Aspirin-Salicylamide-Caffeine (BC HEADACHE POWDER PO) Take 1  packet by mouth daily as needed (for headache).    Marland Kitchen atorvastatin (LIPITOR) 20 MG tablet Take 1 tablet (20 mg total) by mouth daily. 90 tablet 3  . cyclobenzaprine (FLEXERIL) 10 MG tablet TAKE 1 TABLET(10 MG) BY MOUTH THREE TIMES DAILY AS NEEDED FOR MUSCLE SPASMS 30 tablet 0  . dicyclomine (BENTYL) 10 MG capsule Take 10 mg by mouth 2 (two) times daily with a meal. 30 prior to meal    . fluticasone (FLONASE) 50 MCG/ACT nasal spray Place 2 sprays into both nostrils 2 (two) times daily. Decrease to 2 sprays/nostril daily after 5 days 16 g 2  . ibuprofen (ADVIL,MOTRIN) 200 MG tablet Take 4 tablets (800 mg total) by mouth every 6 (six) hours as needed for pain. 30 tablet 4  . irbesartan-hydrochlorothiazide (AVALIDE) 150-12.5 MG tablet TAKE 1 TABLET BY MOUTH ONCE DAILY 90 tablet 2  . QUEtiapine (SEROQUEL) 25 MG tablet TAKE 1 TO 2 TABLETS(25 TO 50 MG) BY MOUTH AT BEDTIME 90 tablet 3  . SUMAtriptan (IMITREX) 50 MG tablet Take one tablet by mouth at the first sign of headache. May repeat in 2 hours if headache persists or recurs. 10 tablet 0  . Tetrahydrozoline HCl (VISINE OP) Apply 1 drop to eye as needed (for allergies).    . Vitamin D, Ergocalciferol, (DRISDOL) 1.25 MG (50000 UT) CAPS capsule Take 1 capsule (50,000 Units total) by mouth every 7 (seven) days. 12 capsule 1   No facility-administered medications prior to visit.    No Known Allergies  ROS Review of Systems Review of Systems  Constitutional: Negative for activity change, appetite change, chills and fever.  HENT: Negative for congestion, nosebleeds, trouble swallowing and voice change.   Respiratory: Negative for cough, shortness of breath and wheezing.   Gastrointestinal: Negative for diarrhea, nausea and vomiting.  Genitourinary: Negative for difficulty urinating, dysuria, flank pain and hematuria.  Musculoskeletal: Negative for back pain, joint swelling and neck pain.  Neurological: Negative for dizziness, speech difficulty,  light-headedness and numbness.  See HPI. All other review of systems negative.     Objective:    Physical Exam  BP 130/78 (BP Location: Right Arm, Patient Position: Sitting, Cuff Size: Large)   Pulse 68   Temp 98.2 F (36.8 C) (Temporal)   Resp 16   Ht 5\' 6"  (1.676 m)   Wt 273 lb 9.6 oz (124.1 kg)   SpO2 97%   BMI 44.16 kg/m  Wt Readings from Last 3 Encounters:  06/20/19 273 lb 9.6 oz (124.1 kg)  03/06/19 270 lb 6.4 oz (122.7 kg)  12/05/18 269 lb (122 kg)   Physical Exam  Constitutional: Oriented to person, place, and time. Appears well-developed and well-nourished.  HENT:  Head: Normocephalic and atraumatic.  Eyes: Conjunctivae and  EOM are normal.  Cardiovascular: Normal rate, regular rhythm, normal heart sounds and intact distal pulses.  No murmur heard. Pulmonary/Chest: Effort normal and breath sounds normal. No stridor. No respiratory distress. Has no wheezes.  Neurological: Is alert and oriented to person, place, and time.  Skin: Skin is warm. Capillary refill takes less than 2 seconds.  Psychiatric: Has a normal mood and affect. Behavior is normal. Judgment and thought content normal.    Health Maintenance Due  Topic Date Due  . COVID-19 Vaccine (1) Never done    There are no preventive care reminders to display for this patient.  No results found for: TSH Lab Results  Component Value Date   WBC 7.1 09/04/2018   HGB 14.0 09/04/2018   HCT 42.5 09/04/2018   MCV 89 09/04/2018   PLT 347 09/04/2018   Lab Results  Component Value Date   NA 138 03/06/2019   K 4.2 03/06/2019   CO2 23 03/06/2019   GLUCOSE 141 (H) 03/06/2019   BUN 15 03/06/2019   CREATININE 0.63 03/06/2019   BILITOT 0.4 03/06/2019   ALKPHOS 117 03/06/2019   AST 9 03/06/2019   ALT 11 03/06/2019   PROT 7.3 03/06/2019   ALBUMIN 4.2 03/06/2019   CALCIUM 9.5 03/06/2019   Lab Results  Component Value Date   CHOL 181 03/06/2019   Lab Results  Component Value Date   HDL 53 03/06/2019    Lab Results  Component Value Date   LDLCALC 105 (H) 03/06/2019   Lab Results  Component Value Date   TRIG 130 03/06/2019   Lab Results  Component Value Date   CHOLHDL 3.4 03/06/2019   Lab Results  Component Value Date   HGBA1C 6.4 (A) 03/06/2019      Assessment & Plan:   Problem List Items Addressed This Visit      Cardiovascular and Mediastinum   Hypertension - Primary Patient's blood pressure is at goal of 139/89 or less. Condition is stable. Continue current medications and treatment plan. I recommend that you exercise for 30-45 minutes 5 days a week. I also recommend a balanced diet with fruits and vegetables every day, lean meats, and little fried foods. The DASH diet (you can find this online) is a good example of this.     Other Visit Diagnoses    Screening, lipid       Vitamin D deficiency       Relevant Orders   VITAMIN D 25 Hydroxy (Vit-D Deficiency, Fractures)   Type 2 diabetes mellitus with hyperglycemia, without long-term current use of insulin ([place])    - well controlled hemoglobin a1c is at goal Continue exercise Lipids monitored and renal function in range On metformin On  arb On asa 81mg  Reviewed diabetic foot care Emphasized importance of eye and dental exam      Relevant Orders   Comprehensive metabolic panel   Lipid panel   Hemoglobin A1c      No orders of the defined types were placed in this encounter.   Follow-up: No follow-ups on file.    Forrest Moron, MD

## 2019-06-21 LAB — HEMOGLOBIN A1C
Est. average glucose Bld gHb Est-mCnc: 143 mg/dL
Hgb A1c MFr Bld: 6.6 % — ABNORMAL HIGH (ref 4.8–5.6)

## 2019-06-21 LAB — COMPREHENSIVE METABOLIC PANEL
ALT: 20 IU/L (ref 0–32)
AST: 18 IU/L (ref 0–40)
Albumin/Globulin Ratio: 1.6 (ref 1.2–2.2)
Albumin: 4.6 g/dL (ref 3.8–4.8)
Alkaline Phosphatase: 117 IU/L (ref 39–117)
BUN/Creatinine Ratio: 19 (ref 9–23)
BUN: 13 mg/dL (ref 6–24)
Bilirubin Total: 0.3 mg/dL (ref 0.0–1.2)
CO2: 27 mmol/L (ref 20–29)
Calcium: 9.7 mg/dL (ref 8.7–10.2)
Chloride: 98 mmol/L (ref 96–106)
Creatinine, Ser: 0.7 mg/dL (ref 0.57–1.00)
GFR calc Af Amer: 117 mL/min/{1.73_m2} (ref >59)
GFR calc non Af Amer: 101 mL/min/{1.73_m2} (ref >59)
Globulin, Total: 2.8 g/dL (ref 1.5–4.5)
Glucose: 104 mg/dL — ABNORMAL HIGH (ref 65–99)
Potassium: 3.9 mmol/L (ref 3.5–5.2)
Sodium: 139 mmol/L (ref 134–144)
Total Protein: 7.4 g/dL (ref 6.0–8.5)

## 2019-06-21 LAB — LIPID PANEL
Chol/HDL Ratio: 3.4 ratio (ref 0.0–4.4)
Cholesterol, Total: 164 mg/dL (ref 100–199)
HDL: 48 mg/dL (ref >39)
LDL Chol Calc (NIH): 96 mg/dL (ref 0–99)
Triglycerides: 113 mg/dL (ref 0–149)
VLDL Cholesterol Cal: 20 mg/dL (ref 5–40)

## 2019-06-21 LAB — VITAMIN D 25 HYDROXY (VIT D DEFICIENCY, FRACTURES): Vit D, 25-Hydroxy: 38.3 ng/mL (ref 30.0–100.0)

## 2019-07-11 ENCOUNTER — Telehealth: Payer: BC Managed Care – PPO | Admitting: Family Medicine

## 2019-07-20 ENCOUNTER — Other Ambulatory Visit: Payer: Self-pay | Admitting: Family Medicine

## 2019-07-20 MED ORDER — IRBESARTAN-HYDROCHLOROTHIAZIDE 150-12.5 MG PO TABS: 1.0000 | ORAL_TABLET | Freq: Every day | ORAL | 2 refills | Status: DC

## 2019-08-17 ENCOUNTER — Other Ambulatory Visit: Payer: Self-pay

## 2019-08-17 DIAGNOSIS — F4322 Adjustment disorder with anxiety: Secondary | ICD-10-CM | POA: Diagnosis not present

## 2019-08-17 DIAGNOSIS — H6692 Otitis media, unspecified, left ear: Secondary | ICD-10-CM | POA: Diagnosis not present

## 2019-08-17 DIAGNOSIS — F418 Other specified anxiety disorders: Secondary | ICD-10-CM | POA: Diagnosis not present

## 2019-10-03 ENCOUNTER — Other Ambulatory Visit: Payer: Self-pay | Admitting: Family Medicine

## 2019-10-03 DIAGNOSIS — Z1231 Encounter for screening mammogram for malignant neoplasm of breast: Secondary | ICD-10-CM

## 2019-10-29 ENCOUNTER — Ambulatory Visit: Payer: Self-pay

## 2019-11-21 ENCOUNTER — Ambulatory Visit
Admission: RE | Admit: 2019-11-21 | Discharge: 2019-11-21 | Disposition: A | Discharge status: Home / Self Care | Payer: BC Managed Care – PPO | Source: Ambulatory Visit | Attending: Family Medicine | Admitting: Family Medicine

## 2019-11-21 ENCOUNTER — Other Ambulatory Visit: Payer: Self-pay

## 2019-11-21 DIAGNOSIS — Z1231 Encounter for screening mammogram for malignant neoplasm of breast: Secondary | ICD-10-CM | POA: Diagnosis not present

## 2019-11-23 ENCOUNTER — Other Ambulatory Visit: Payer: Self-pay | Admitting: Family Medicine

## 2019-11-23 DIAGNOSIS — R928 Other abnormal and inconclusive findings on diagnostic imaging of breast: Secondary | ICD-10-CM

## 2019-11-30 ENCOUNTER — Ambulatory Visit
Admission: RE | Admit: 2019-11-30 | Discharge: 2019-11-30 | Disposition: A | Discharge status: Home / Self Care | Payer: BC Managed Care – PPO | Source: Ambulatory Visit | Attending: Family Medicine | Admitting: Family Medicine

## 2019-11-30 ENCOUNTER — Other Ambulatory Visit: Payer: Self-pay | Admitting: Family Medicine

## 2019-11-30 ENCOUNTER — Other Ambulatory Visit: Payer: Self-pay

## 2019-11-30 DIAGNOSIS — N6489 Other specified disorders of breast: Secondary | ICD-10-CM | POA: Diagnosis not present

## 2019-11-30 DIAGNOSIS — N6310 Unspecified lump in the right breast, unspecified quadrant: Secondary | ICD-10-CM

## 2019-11-30 DIAGNOSIS — C50919 Malignant neoplasm of unspecified site of unspecified female breast: Secondary | ICD-10-CM

## 2019-11-30 DIAGNOSIS — R928 Other abnormal and inconclusive findings on diagnostic imaging of breast: Secondary | ICD-10-CM

## 2019-11-30 DIAGNOSIS — R922 Inconclusive mammogram: Secondary | ICD-10-CM | POA: Diagnosis not present

## 2019-11-30 DIAGNOSIS — R599 Enlarged lymph nodes, unspecified: Secondary | ICD-10-CM

## 2019-11-30 HISTORY — DX: Malignant neoplasm of unspecified site of unspecified female breast: C50.919

## 2019-12-13 ENCOUNTER — Ambulatory Visit
Admission: RE | Admit: 2019-12-13 | Discharge: 2019-12-13 | Disposition: A | Discharge status: Home / Self Care | Payer: BC Managed Care – PPO | Source: Ambulatory Visit | Attending: Family Medicine | Admitting: Family Medicine

## 2019-12-13 ENCOUNTER — Other Ambulatory Visit: Payer: Self-pay | Admitting: Family Medicine

## 2019-12-13 ENCOUNTER — Other Ambulatory Visit: Payer: Self-pay

## 2019-12-13 DIAGNOSIS — R590 Localized enlarged lymph nodes: Secondary | ICD-10-CM | POA: Diagnosis not present

## 2019-12-13 DIAGNOSIS — R599 Enlarged lymph nodes, unspecified: Secondary | ICD-10-CM

## 2019-12-13 DIAGNOSIS — N6310 Unspecified lump in the right breast, unspecified quadrant: Secondary | ICD-10-CM

## 2019-12-13 DIAGNOSIS — C50811 Malignant neoplasm of overlapping sites of right female breast: Secondary | ICD-10-CM | POA: Diagnosis not present

## 2019-12-13 DIAGNOSIS — Z170 Estrogen receptor positive status [ER+]: Secondary | ICD-10-CM | POA: Diagnosis not present

## 2019-12-13 DIAGNOSIS — C50411 Malignant neoplasm of upper-outer quadrant of right female breast: Secondary | ICD-10-CM | POA: Diagnosis not present

## 2019-12-13 DIAGNOSIS — R928 Other abnormal and inconclusive findings on diagnostic imaging of breast: Secondary | ICD-10-CM | POA: Diagnosis not present

## 2019-12-13 DIAGNOSIS — N6312 Unspecified lump in the right breast, upper inner quadrant: Secondary | ICD-10-CM | POA: Diagnosis not present

## 2019-12-13 DIAGNOSIS — N6311 Unspecified lump in the right breast, upper outer quadrant: Secondary | ICD-10-CM | POA: Diagnosis not present

## 2019-12-17 ENCOUNTER — Other Ambulatory Visit: Payer: Self-pay | Admitting: General Surgery

## 2019-12-17 DIAGNOSIS — Z170 Estrogen receptor positive status [ER+]: Secondary | ICD-10-CM | POA: Diagnosis not present

## 2019-12-17 DIAGNOSIS — Z803 Family history of malignant neoplasm of breast: Secondary | ICD-10-CM | POA: Diagnosis not present

## 2019-12-17 DIAGNOSIS — C50411 Malignant neoplasm of upper-outer quadrant of right female breast: Secondary | ICD-10-CM | POA: Diagnosis not present

## 2019-12-19 ENCOUNTER — Other Ambulatory Visit: Payer: Self-pay

## 2019-12-19 ENCOUNTER — Telehealth: Payer: Self-pay | Admitting: Hematology and Oncology

## 2019-12-19 ENCOUNTER — Inpatient Hospital Stay: Payer: BC Managed Care – PPO | Attending: Hematology and Oncology | Admitting: Hematology and Oncology

## 2019-12-19 ENCOUNTER — Encounter: Payer: Self-pay | Admitting: *Deleted

## 2019-12-19 VITALS — BP 135/81 | HR 88 | Temp 98.1°F | Resp 18 | Ht 66.0 in | Wt 277.4 lb

## 2019-12-19 DIAGNOSIS — Z01818 Encounter for other preprocedural examination: Secondary | ICD-10-CM | POA: Diagnosis not present

## 2019-12-19 DIAGNOSIS — J45909 Unspecified asthma, uncomplicated: Secondary | ICD-10-CM | POA: Diagnosis not present

## 2019-12-19 DIAGNOSIS — Z79899 Other long term (current) drug therapy: Secondary | ICD-10-CM | POA: Insufficient documentation

## 2019-12-19 DIAGNOSIS — E119 Type 2 diabetes mellitus without complications: Secondary | ICD-10-CM | POA: Diagnosis not present

## 2019-12-19 DIAGNOSIS — Z7984 Long term (current) use of oral hypoglycemic drugs: Secondary | ICD-10-CM | POA: Insufficient documentation

## 2019-12-19 DIAGNOSIS — F329 Major depressive disorder, single episode, unspecified: Secondary | ICD-10-CM | POA: Diagnosis not present

## 2019-12-19 DIAGNOSIS — I10 Essential (primary) hypertension: Secondary | ICD-10-CM | POA: Insufficient documentation

## 2019-12-19 DIAGNOSIS — C50411 Malignant neoplasm of upper-outer quadrant of right female breast: Secondary | ICD-10-CM | POA: Insufficient documentation

## 2019-12-19 DIAGNOSIS — Z170 Estrogen receptor positive status [ER+]: Secondary | ICD-10-CM | POA: Insufficient documentation

## 2019-12-19 MED ORDER — LORAZEPAM 0.5 MG PO TABS: 0.5000 mg | ORAL_TABLET | Freq: Every evening | ORAL | 0 refills | Status: DC | PRN

## 2019-12-19 MED ORDER — LIDOCAINE-PRILOCAINE 2.5-2.5 % EX CREA: TOPICAL_CREAM | CUTANEOUS | 3 refills | Status: DC

## 2019-12-19 MED ORDER — PROCHLORPERAZINE MALEATE 10 MG PO TABS: 10.0000 mg | ORAL_TABLET | Freq: Four times a day (QID) | ORAL | 1 refills | Status: DC | PRN

## 2019-12-19 MED ORDER — ONDANSETRON HCL 8 MG PO TABS: 8.0000 mg | ORAL_TABLET | Freq: Two times a day (BID) | ORAL | 1 refills | Status: DC | PRN

## 2019-12-19 MED ORDER — DEXAMETHASONE 4 MG PO TABS: 4.0000 mg | ORAL_TABLET | Freq: Every day | ORAL | 0 refills | Status: DC

## 2019-12-19 NOTE — Telephone Encounter (Signed)
Received a new pt referral from Dr. Barry Dienes for dx of rbeast cancer. Cathy Parrish has been cld and scheduled to see Dr. Lindi Adie today at [facility]. Pt aware to arrive 15 minutes early.

## 2019-12-19 NOTE — Progress Notes (Signed)
START OFF PATHWAY REGIMEN - Breast   [id number]:Dose-Dense AC q14 Days C1-4 followed by Carboplatin AUC=6 D1 + Paclitaxel 80 mg/m2 D1,8,15 q21 Days C5-8:   A cycle is every 14 days (cycles 1-4):     Doxorubicin      Cyclophosphamide      Pegfilgrastim-[id number]    A cycle is every 21 days (cycles 5-8):     Paclitaxel      Carboplatin   **Always confirm dose/schedule in your pharmacy ordering system**  Patient Characteristics: Preoperative or Nonsurgical Candidate (Clinical Staging), Neoadjuvant Therapy followed by Surgery, Invasive Disease, Chemotherapy, HER2 Negative/Unknown/Equivocal, ER Positive Therapeutic Status: Preoperative or Nonsurgical Candidate (Clinical Staging) AJCC M Category: cM0 AJCC Grade: G3 Breast Surgical Plan: Neoadjuvant Therapy followed by Surgery ER Status: Positive (+) AJCC 8 Stage Grouping: IIIA HER2 Status: Negative (-) AJCC T Category: cT2 AJCC N Category: cN1 PR Status: Negative (-) Intent of Therapy: Curative Intent, Discussed with Patient

## 2019-12-19 NOTE — Assessment & Plan Note (Signed)
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%, HER-2 negative 12 o'clock position: 1.9 cm: Biopsy: Grade 3 IDC ER 40%, PR 0%, HER-2 negative, Ki-67 40%, right axillary lymph node biopsied benign but discordant T2N?M0 stage IIa versus stage IIIa (based on the lymph node)  Pathology and radiology counseling: Discussed with the patient, the details of pathology including the type of breast cancer,the clinical staging, the significance of ER, PR and HER-2/neu receptors and the implications for treatment. After reviewing the pathology in detail, we proceeded to discuss the different treatment options between surgery, radiation, chemotherapy, antiestrogen therapies.  Recommendation: 1.  Neoadjuvant chemotherapy with dose dense Adriamycin and Cytoxan followed by Taxol 2. depending on the response breast conserving surgery versus mastectomy  3.  Adjuvant radiation 4.  Followed by adjuvant antiestrogen therapy with abemaciclib  Chemotherapy Counseling: I discussed the risks and benefits of chemotherapy including the risks of nausea/ vomiting, risk of infection from low WBC count, fatigue due to chemo or anemia, bruising or bleeding due to low platelets, mouth sores, loss/ change in taste and decreased appetite. Liver and kidney function will be monitored through out chemotherapy as abnormalities in liver and kidney function may be a side effect of treatment. Cardiac dysfunction due to Adriamycin and neuropathy risk to Taxol were discussed in detail. Risk of permanent bone marrow dysfunction and leukemia due to chemo were also discussed.  Plan: 1.  Breast MRI 2. Echocardiogram 3.  Port placement 4.  Chemo class Return to clinic to start chemotherapy in 2 weeks.

## 2019-12-19 NOTE — Progress Notes (Signed)
[facility] CONSULT NOTE  Patient Care Team: Forrest Moron, MD as PCP - General (Internal Medicine) Rockwell Germany, RN as Oncology Nurse Navigator Rockwell Germany, RN as Oncology Nurse Navigator  CHIEF COMPLAINTS/PURPOSE OF CONSULTATION:  Newly diagnosed breast cancer  HISTORY OF PRESENTING ILLNESS:  Cathy Parrish 49 y.o. female is here because of recent diagnosis of right breast cancer.  Patient had a sister with breast cancer.  She had a screening mammogram that detected right breast masses.  11:30 position it was 4.3 cm which on biopsy came back as grade 3 IDC ER positive PR negative HER-2 negative Ki-67 40%.  12 o'clock position was 1.9 cm mass that was ER 40% PR negative HER-2 negative Ki-67 40%.  Both were grade 3 and there were 6.4 cm apart.  She had a right axillary lymph node that was enlarged and biopsy-was benign and it was felt to be discordant. She was presented this morning to the multidisciplinary tumor board and she is here today to discuss her treatment plan.  I reviewed her records extensively and collaborated the history with the patient.  SUMMARY OF ONCOLOGIC HISTORY: Oncology History  Malignant neoplasm of upper-outer quadrant of right breast in female, estrogen receptor positive ([place])  12/13/2019 Initial Diagnosis   Screening detected right breast masses 11:30 position: 4.3 cm: Biopsy grade 3 IDC ER 90% PR 0%, KI 40%, HER-2 negative 12 o'clock position: 1.9 cm: Biopsy: Grade 3 IDC ER 40%, PR 0%, HER-2 negative, Ki-67 40%, right axillary lymph node biopsied benign but discordant   12/19/2019 Cancer Staging   Staging form: Breast, AJCC 8th Edition - Clinical stage from 12/19/2019: Stage IIIA (cT2, cN1, cM0, G3, ER+, PR-, HER2-) - Signed by Nicholas Lose, MD on 12/19/2019      MEDICAL HISTORY:  Past Medical History:  Diagnosis Date   Asthma    Depression    Hypertension     SURGICAL HISTORY: Past Surgical History:  Procedure  Laterality Date   BREAST BIOPSY Right    COLONOSCOPY  07/13/2011   Procedure: COLONOSCOPY;  Surgeon: Gatha Mayer, MD;  Location: [facility];  Service: Endoscopy;  Laterality: N/A;   DILATION AND CURETTAGE OF UTERUS  abortion   DILITATION & CURRETTAGE/HYSTROSCOPY WITH NOVASURE ABLATION N/A 09/28/2012   Procedure: DILATATION & CURETTAGE/HYSTEROSCOPY WITH NOVASURE ABLATION; And Resectoscope;  Surgeon: Marvene Staff, MD;  Location: [facility];  Service: Gynecology;  Laterality: N/A;   ESOPHAGOGASTRODUODENOSCOPY  07/13/2011   Procedure: ESOPHAGOGASTRODUODENOSCOPY (EGD);  Surgeon: Gatha Mayer, MD;  Location: [facility];  Service: Endoscopy;  Laterality: N/A;    SOCIAL HISTORY: Social History   Socioeconomic History   Marital status: Single    Spouse name: Not on file   Number of children: Not on file   Years of education: Not on file   Highest education level: Not on file  Occupational History   Not on file  Tobacco Use   Smoking status: Never Smoker   Smokeless tobacco: Never Used  Vaping Use   Vaping Use: Never used  Substance and Sexual Activity   Alcohol use: Yes    Alcohol/week: 3.0 standard drinks    Types: 3 Cans of beer per week    Comment: twice a week   Drug use: No   Sexual activity: Yes    Birth control/protection: Condom  Other Topics Concern   Not on file  Social History Narrative   Not on file   Social Determinants of Health  Financial Resource Strain:    Difficulty of Paying Living Expenses: Not on file  Food Insecurity:    Worried About Charity fundraiser in the Last Year: Not on file   [employer] of Food in the Last Year: Not on file  Transportation Needs:    Lack of Transportation (Medical): Not on file   Lack of Transportation (Non-Medical): Not on file  Physical Activity:    Days of Exercise per Week: Not on file   Minutes of Exercise per Session: Not on file  Stress:    Feeling of Stress : Not on file  Social  Connections:    Frequency of Communication with Friends and Family: Not on file   Frequency of Social Gatherings with Friends and Family: Not on file   Attends Religious Services: Not on file   Active Member of Clubs or Organizations: Not on file   Attends Archivist Meetings: Not on file   Marital Status: Not on file  Intimate Partner Violence:    Fear of Current or Ex-Partner: Not on file   Emotionally Abused: Not on file   Physically Abused: Not on file   Sexually Abused: Not on file    FAMILY HISTORY: Family History  Problem Relation Age of Onset   Breast cancer Sister     ALLERGIES:  has No Known Allergies.  MEDICATIONS:  Current Outpatient Medications  Medication Sig Dispense Refill   albuterol (PROVENTIL HFA;VENTOLIN HFA) 108 (90 BASE) MCG/ACT inhaler Inhale 2 puffs into the lungs every 4 (four) hours as needed for wheezing. 1 Inhaler 0   ALPRAZolam (XANAX) 0.5 MG tablet TAKE 1 TABLET(0.5 MG) BY MOUTH AT BEDTIME AS NEEDED FOR ANXIETY 30 tablet 0   Aspirin-Salicylamide-Caffeine (BC HEADACHE POWDER PO) Take 1 packet by mouth daily as needed (for headache).     atorvastatin (LIPITOR) 20 MG tablet Take 1 tablet (20 mg total) by mouth daily. 90 tablet 3   cyclobenzaprine (FLEXERIL) 10 MG tablet TAKE 1 TABLET(10 MG) BY MOUTH THREE TIMES DAILY AS NEEDED FOR MUSCLE SPASMS 30 tablet 0   dicyclomine (BENTYL) 10 MG capsule Take 10 mg by mouth 2 (two) times daily with a meal. 30 prior to meal     fluticasone (FLONASE) 50 MCG/ACT nasal spray Place 2 sprays into both nostrils 2 (two) times daily. Decrease to 2 sprays/nostril daily after 5 days 16 g 2   ibuprofen (ADVIL,MOTRIN) 200 MG tablet Take 4 tablets (800 mg total) by mouth every 6 (six) hours as needed for pain. 30 tablet 4   irbesartan-hydrochlorothiazide (AVALIDE) 150-12.5 MG tablet Take 1 tablet by mouth daily. 90 tablet 2   metFORMIN (GLUCOPHAGE) 500 MG tablet Take 1 tablet (500 mg total) by mouth 2  (two) times daily with a meal. 180 tablet 0   QUEtiapine (SEROQUEL) 25 MG tablet TAKE 1 TO 2 TABLETS(25 TO 50 MG) BY MOUTH AT BEDTIME 90 tablet 3   SUMAtriptan (IMITREX) 50 MG tablet Take one tablet by mouth at the first sign of headache. May repeat in 2 hours if headache persists or recurs. 10 tablet 0   Tetrahydrozoline HCl (VISINE OP) Apply 1 drop to eye as needed (for allergies).     Vitamin D, Ergocalciferol, (DRISDOL) 1.25 MG (50000 UT) CAPS capsule Take 1 capsule (50,000 Units total) by mouth every 7 (seven) days. 12 capsule 1   No current facility-administered medications for this visit.    REVIEW OF SYSTEMS:     All other systems were reviewed  with the patient and are negative.  PHYSICAL EXAMINATION: ECOG PERFORMANCE STATUS: 1 - Symptomatic but completely ambulatory  Vitals:   12/19/19 1558  BP: 135/81  Pulse: 88  Resp: 18  Temp: 98.1 F (36.7 C)  SpO2: 97%   Filed Weights   12/19/19 1558  Weight: 277 lb 6.4 oz (125.8 kg)       LABORATORY DATA:  I have reviewed the data as listed Lab Results  Component Value Date   WBC 7.1 09/04/2018   HGB 14.0 09/04/2018   HCT 42.5 09/04/2018   MCV 89 09/04/2018   PLT 347 09/04/2018   Lab Results  Component Value Date   NA 139 06/20/2019   K 3.9 06/20/2019   CL 98 06/20/2019   CO2 27 06/20/2019    RADIOGRAPHIC STUDIES: I have personally reviewed the radiological reports and agreed with the findings in the report.  ASSESSMENT AND PLAN:  Malignant neoplasm of upper-outer quadrant of right breast in female, estrogen receptor positive ([place]) 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%, HER-2 negative 12 o'clock position: 1.9 cm: Biopsy: Grade 3 IDC ER 40%, PR 0%, HER-2 negative, Ki-67 40%, right axillary lymph node biopsied benign but discordant T2N?M0 stage IIa versus stage IIIa (based on the lymph node)  Pathology and radiology counseling:  Discussed with the patient, the details of pathology including the type of breast cancer,the clinical staging, the significance of ER, PR and HER-2/neu receptors and the implications for treatment. After reviewing the pathology in detail, we proceeded to discuss the different treatment options between surgery, radiation, chemotherapy, antiestrogen therapies.  Recommendation: 1.  Neoadjuvant chemotherapy with dose dense Adriamycin and Cytoxan followed by Taxol 2. depending on the response breast conserving surgery versus mastectomy  3.  Adjuvant radiation 4.  Followed by adjuvant antiestrogen therapy with abemaciclib  Chemotherapy Counseling: I discussed the risks and benefits of chemotherapy including the risks of nausea/ vomiting, risk of infection from low WBC count, fatigue due to chemo or anemia, bruising or bleeding due to low platelets, mouth sores, loss/ change in taste and decreased appetite. Liver and kidney function will be monitored through out chemotherapy as abnormalities in liver and kidney function may be a side effect of treatment. Cardiac dysfunction due to Adriamycin and neuropathy risk to Taxol were discussed in detail. Risk of permanent bone marrow dysfunction and leukemia due to chemo were also discussed.  Plan: 1.  Breast MRI 2. Echocardiogram 3.  Port placement 4.  Chemo class 5.  Genetic testing Return to clinic to start chemotherapy in 2 weeks.  All questions were answered. The patient knows to call the clinic with any problems, questions or concerns.    Harriette Ohara, MD 12/19/19

## 2019-12-20 ENCOUNTER — Other Ambulatory Visit: Payer: Self-pay | Admitting: *Deleted

## 2019-12-20 ENCOUNTER — Other Ambulatory Visit: Payer: Self-pay | Admitting: General Surgery

## 2019-12-20 ENCOUNTER — Telehealth: Payer: Self-pay | Admitting: *Deleted

## 2019-12-20 ENCOUNTER — Encounter: Payer: Self-pay | Admitting: *Deleted

## 2019-12-20 DIAGNOSIS — C50411 Malignant neoplasm of upper-outer quadrant of right female breast: Secondary | ICD-10-CM

## 2019-12-20 DIAGNOSIS — Z170 Estrogen receptor positive status [ER+]: Secondary | ICD-10-CM

## 2019-12-20 DIAGNOSIS — Z171 Estrogen receptor negative status [ER-]: Secondary | ICD-10-CM

## 2019-12-20 NOTE — Telephone Encounter (Signed)
Left vm regarding navigation resources and contact information. Request return call with questions or needs.  

## 2019-12-21 ENCOUNTER — Encounter: Payer: Self-pay | Admitting: *Deleted

## 2019-12-21 ENCOUNTER — Telehealth: Payer: Self-pay | Admitting: Hematology and Oncology

## 2019-12-21 NOTE — Telephone Encounter (Signed)
Scheduled appointment per 10/21 sch msg. Spoke to patient who is aware of appointment date and time.

## 2019-12-21 NOTE — Telephone Encounter (Signed)
Scheduled per 10/20 los. Called and spoke with pt, confirmed 11/16 appts

## 2019-12-24 ENCOUNTER — Encounter: Payer: Self-pay | Admitting: *Deleted

## 2019-12-25 ENCOUNTER — Encounter: Payer: Self-pay | Admitting: *Deleted

## 2019-12-25 ENCOUNTER — Ambulatory Visit
Admission: RE | Admit: 2019-12-25 | Discharge: 2019-12-25 | Disposition: A | Discharge status: Home / Self Care | Payer: BC Managed Care – PPO | Source: Ambulatory Visit | Attending: General Surgery | Admitting: General Surgery

## 2019-12-25 ENCOUNTER — Other Ambulatory Visit: Payer: Self-pay | Admitting: *Deleted

## 2019-12-25 ENCOUNTER — Other Ambulatory Visit: Payer: Self-pay | Admitting: Hematology and Oncology

## 2019-12-25 DIAGNOSIS — Z170 Estrogen receptor positive status [ER+]: Secondary | ICD-10-CM

## 2019-12-25 DIAGNOSIS — Z171 Estrogen receptor negative status [ER-]: Secondary | ICD-10-CM

## 2019-12-25 DIAGNOSIS — C50411 Malignant neoplasm of upper-outer quadrant of right female breast: Secondary | ICD-10-CM

## 2019-12-25 DIAGNOSIS — N6311 Unspecified lump in the right breast, upper outer quadrant: Secondary | ICD-10-CM | POA: Diagnosis not present

## 2019-12-25 MED ORDER — GADOBUTROL 1 MMOL/ML IV SOLN
10.0000 mL | Freq: Once | INTRAVENOUS | Status: AC | PRN
  Administered 2019-12-25: 10 mL via INTRAVENOUS

## 2019-12-25 NOTE — Progress Notes (Signed)
Location of Breast Cancer: UOQ Right Breast  Did patient present with symptoms (if so, please note symptoms) or was this found on screening mammography?: Routine mammogram.  Patient stated she has not noticed any changes in her breast.  MRI Breast 12/24/2019: Biopsy-proven invasive carcinomas (grouped irregular masses with associated biopsy clips) located within the RIGHT breast at the 12 o'clock axis and 11:30 o'clock axis, overall measuring 5.4 x 4.5 x 4.3 cm. Additional nearly contiguous mass and non-mass enhancement extending 3.3 cm anterior-inferior to the biopsy-proven invasive carcinomas in the upper RIGHT breast, to anterior depth just above the level of the nipple, highly suspicious for additional extent of disease, which would increase the overall greatest measurement to 7.6 cm.  Irregular enhancing mass within the slightly upper LEFT breast, at anterior depth, measuring 8 mm, with suspicious enhancement kinetics (series 6, image 92). This is suspicious for contralateral disease. MRI-guided biopsy is recommended. 4. Single morphologically abnormal lymph node within the RIGHT axilla, with cortical thickness measuring 7 mm, with associated biopsy clip artifact corresponding to the recently biopsied axillary lymph node (discordant benign pathology result)  Mammogram 11/30/2019: Highly suspicious mass, what appears to be conglomeration of masses, in the right breast at 11:30 o'clock, spanning 4.3 cm, with a highly suspicious satellite mass at 12 o'clock, measuring 1.9 cm.  There is also an abnormal right axillary lymph node with a maximal cortical thickness of 8 mm.  Histology per Pathology Report: Right Breast 12/13/2019  Receptor Status: ER(+40%), PR (-0%), Her2-neu (-), Ki-67(40%)    Past/Anticipated interventions by surgeon, if any: Dr. Barry Dienes 12/17/2019 - MRI with/without contrast -Referral to genetic counseling- 01/15/2020 -Referral to oncology -Referral to radiation  oncology -Follow-up with [facility] after tests to discuss further plans.   Past/Anticipated interventions by medical oncology, if any: Chemotherapy  Dr. Lindi Adie 12/19/2019 Recommendation: 1.  Neoadjuvant chemotherapy with dose dense Adriamycin and Cytoxan followed by Taxol- Start date 01/03/2020 2. depending on the response breast conserving surgery versus mastectomy  3.  Adjuvant radiation 4.  Followed by adjuvant antiestrogen therapy with abemaciclib  Lymphedema issues, if any:  Has had some swelling in her arm.  Pain issues, if any:  Continues to have pain at the biopsy site.  SAFETY ISSUES:  Prior radiation? no  Pacemaker/ICD? no  Possible current pregnancy? Ablation, Condom use  Is the patient on methotrexate? No  Current Complaints / other details:      Cori Razor, RN 12/25/2019,2:12 PM

## 2019-12-26 ENCOUNTER — Ambulatory Visit: Payer: BC Managed Care – PPO

## 2019-12-26 ENCOUNTER — Encounter: Payer: Self-pay | Admitting: Radiation Oncology

## 2019-12-26 ENCOUNTER — Encounter (HOSPITAL_BASED_OUTPATIENT_CLINIC_OR_DEPARTMENT_OTHER): Payer: Self-pay | Admitting: General Surgery

## 2019-12-26 ENCOUNTER — Other Ambulatory Visit: Payer: Self-pay

## 2019-12-26 ENCOUNTER — Ambulatory Visit
Admission: RE | Admit: 2019-12-26 | Discharge: 2019-12-26 | Disposition: A | Discharge status: Home / Self Care | Payer: BC Managed Care – PPO | Source: Ambulatory Visit | Attending: Radiation Oncology | Admitting: Radiation Oncology

## 2019-12-26 ENCOUNTER — Encounter: Payer: Self-pay | Admitting: Licensed Clinical Social Worker

## 2019-12-26 VITALS — Ht 66.0 in | Wt 277.0 lb

## 2019-12-26 DIAGNOSIS — Z170 Estrogen receptor positive status [ER+]: Secondary | ICD-10-CM | POA: Diagnosis not present

## 2019-12-26 DIAGNOSIS — C50411 Malignant neoplasm of upper-outer quadrant of right female breast: Secondary | ICD-10-CM

## 2019-12-26 DIAGNOSIS — N6320 Unspecified lump in the left breast, unspecified quadrant: Secondary | ICD-10-CM | POA: Diagnosis not present

## 2019-12-26 DIAGNOSIS — Z803 Family history of malignant neoplasm of breast: Secondary | ICD-10-CM | POA: Diagnosis not present

## 2019-12-26 NOTE — Progress Notes (Signed)
[facility]  Initial Assessment   Rukiya Kynzee Devinney is a 49 y.o. year old female contacted by phone. Clinical Social Work was referred by distress screen for assessment of psychosocial needs.   SDOH (Social Determinants of Health) assessments performed: No   Distress Screen completed: Yes ONCBCN DISTRESS SCREENING 12/26/2019  Screening Type Initial Screening  Distress experienced in past week (1-10) 10  Emotional problem type Adjusting to illness;Nervousness/Anxiety;Depression  Physical Problem type Pain  Other Contact via phone    Family/Social Information:  . Housing Arrangement: patient lives with adult son . Family members/support persons in your life? Family, Friends/Colleagues and Church . Transportation concerns: no  . Employment: Working full time for [employer] source: Employment. 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 Food access concerns: has access but has not been cooking as much due to stress lately . Religious or spiritual practice: yes, involved in her church . Medication Concerns: no  . Services Currently in place:  Working on [employer], short term disability paperwork. Has peer mentor in place  Coping/ Adjustment to diagnosis: . Patient understands treatment plan and what happens next? yes, overwhelmed but knows the plan and is trying to stay positive . Concerns about diagnosis and/or treatment: Overwhelmed by information and How I will pay for the services I need . Patient reported stressors: Work/ school, Finances, Anxiety and Adjusting to my illness . Hopes and priorities: hopes to get through treatment and stay positive . Current coping skills/ strengths: Average or above average intelligence, Capable of independent living, Communication skills, Religious Affiliation and Supportive  family/friends    SUMMARY: Current SDOH Barriers:  . Financial constraints related to decreased work due to treatment  Clinical Social Work Clinical Goal(s):  Marland Kitchen Patient will continue to access support people and programs and will look into resources discussed with CSW  Interventions: . Discussed common feeling and emotions when being diagnosed with cancer, and the importance of support during treatment . Informed patient of the support team roles and support services at [facility] . Provided CSW contact information and encouraged patient to call with any questions or concerns   Follow Up Plan: CSW will follow up with patient at first infusion appt 11/4 for emotional support and to discuss further financial resources Patient verbalizes understanding of plan: Yes    Edwinna Areola Kennia Vanvorst , LCSW

## 2019-12-26 NOTE — Progress Notes (Signed)
Radiation Oncology         [phone] ________________________________  Name: Cathy Parrish        MRN: [id number]  Date of Service: 12/26/2019 DOB: 03-26-1970  [id number], Cathy Solomons, MD  Stark Klein, MD     REFERRING PHYSICIAN: Stark Klein, MD   DIAGNOSIS: The encounter diagnosis was Malignant neoplasm of upper-outer quadrant of right breast in female, estrogen receptor positive ([place]).   HISTORY OF PRESENT ILLNESS: Cathy Parrish is a 49 y.o. female with a new diagnosis of right breast cancer. The patient was noted to have a screening detected mass in the right breast and she has a history of a benign right breast biopsy. Further diagnostic imaging revealed a 4.3 x 3.4 x 3 cm mass at 11:30, as well as a 12:00 mass measurign 1.9 x 1.5 x 1.4 cm, and an abnormal axillary node was seen. The sman of the two masses were 6.4 cm, and a biopsy of both masses were performed on 12/13/19. 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 Her node was biopsied as well and was benign but discordant. An MRI on 12/25/19 revealed her known disease spanning 5.4 x 4.5 x 4.3 cm, and nonmass enhancement was seen measurin 3.3 cm anterior-inferior to the biopsy proven disease. This added to the span totaling 7.6 cm,  And the left breast showed an 8 mm mass. A single abnormal right axillary node measured 7 mm. It was recommended she undergo an MRI guided biopsy of the left breast. She's seen today on Mychart to discuss options of treatment but plans on neoadjuvant chemotherapy with Dr. Lindi Adie.    PREVIOUS RADIATION THERAPY: No   PAST MEDICAL HISTORY:  Past Medical History:  Diagnosis Date  . Asthma   . Depression   . Hypertension        PAST SURGICAL HISTORY: Past Surgical History:  Procedure Laterality Date  . BREAST BIOPSY Right   . COLONOSCOPY  07/13/2011   Procedure: COLONOSCOPY;  Surgeon: Gatha Mayer, MD;  Location: [facility];  Service: Endoscopy;  Laterality: N/A;  . DILATION AND CURETTAGE OF UTERUS  abortion  . DILITATION & CURRETTAGE/HYSTROSCOPY WITH NOVASURE ABLATION N/A 09/28/2012   Procedure: DILATATION & CURETTAGE/HYSTEROSCOPY WITH NOVASURE ABLATION; And Resectoscope;  Surgeon: Marvene Staff, MD;  Location: [facility];  Service: Gynecology;  Laterality: N/A;  . ESOPHAGOGASTRODUODENOSCOPY  07/13/2011   Procedure: ESOPHAGOGASTRODUODENOSCOPY (EGD);  Surgeon: Gatha Mayer, MD;  Location: [facility];  Service: Endoscopy;  Laterality: N/A;     FAMILY HISTORY:  Family History  Problem Relation Age of Onset  . Breast cancer Sister      SOCIAL HISTORY:  reports that she has never smoked. She has never used smokeless tobacco. She reports current alcohol use of about 3.0 standard drinks of alcohol per week. She reports that she does not use drugs. The patient is single and lives in [place]. 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 She's been working remotely from home during the pandemic.   ALLERGIES: Patient has no known allergies.   MEDICATIONS:  Current Outpatient Medications  Medication Sig Dispense Refill  . albuterol (PROVENTIL HFA;VENTOLIN HFA) 108 (90 BASE) MCG/ACT inhaler Inhale 2 puffs into the lungs every 4 (four) hours as needed for wheezing. 1 Inhaler 0  . ALPRAZolam (XANAX) 0.5 MG tablet TAKE 1 TABLET(0.5 MG) BY MOUTH AT BEDTIME AS NEEDED FOR  ANXIETY 30 tablet 0  . Aspirin-Salicylamide-Caffeine (BC HEADACHE POWDER PO) Take 1 packet by mouth daily as needed (for headache).    Marland Kitchen atorvastatin (LIPITOR) 20 MG tablet Take 1 tablet (20 mg total) by mouth daily. 90 tablet 3  . cyclobenzaprine (FLEXERIL) 10 MG tablet TAKE 1 TABLET(10 MG) BY MOUTH THREE TIMES DAILY AS NEEDED FOR MUSCLE SPASMS 30 tablet 0  . dexamethasone (DECADRON) 4 MG tablet Take 1 tablet (4 mg total) by mouth daily. Take 1 tablet day after chemo and 1 tablet 2 days after chemo with food 8  tablet 0  . dicyclomine (BENTYL) 10 MG capsule Take 10 mg by mouth 2 (two) times daily with a meal. 30 prior to meal    . fluticasone (FLONASE) 50 MCG/ACT nasal spray Place 2 sprays into both nostrils 2 (two) times daily. Decrease to 2 sprays/nostril daily after 5 days 16 g 2  . ibuprofen (ADVIL,MOTRIN) 200 MG tablet Take 4 tablets (800 mg total) by mouth every 6 (six) hours as needed for pain. 30 tablet 4  . irbesartan-hydrochlorothiazide (AVALIDE) 150-12.5 MG tablet Take 1 tablet by mouth daily. 90 tablet 2  . lidocaine-prilocaine (EMLA) cream Apply to affected area once 30 g 3  . LORazepam (ATIVAN) 0.5 MG tablet Take 1 tablet (0.5 mg total) by mouth at bedtime as needed for sleep. 30 tablet 0  . metFORMIN (GLUCOPHAGE) 500 MG tablet Take 1 tablet (500 mg total) by mouth 2 (two) times daily with a meal. 180 tablet 0  . ondansetron (ZOFRAN) 8 MG tablet Take 1 tablet (8 mg total) by mouth 2 (two) times daily as needed. Start on the third day after chemotherapy. 30 tablet 1  . prochlorperazine (COMPAZINE) 10 MG tablet Take 1 tablet (10 mg total) by mouth every 6 (six) hours as needed (Nausea or vomiting). 30 tablet 1  . QUEtiapine (SEROQUEL) 25 MG tablet TAKE 1 TO 2 TABLETS(25 TO 50 MG) BY MOUTH AT BEDTIME 90 tablet 3  . SUMAtriptan (IMITREX) 50 MG tablet Take one tablet by mouth at the first sign of headache. May repeat in 2 hours if headache persists or recurs. 10 tablet 0  . Tetrahydrozoline HCl (VISINE OP) Apply 1 drop to eye as needed (for allergies).    . Vitamin D, Ergocalciferol, (DRISDOL) 1.25 MG (50000 UT) CAPS capsule Take 1 capsule (50,000 Units total) by mouth every 7 (seven) days. 12 capsule 1   No current facility-administered medications for this encounter.     REVIEW OF SYSTEMS: On review of systems, the patient reports that she is doing well overall. She reports she is doing well overall and is ready to get started with treatment soon. No specific complaints are noted regarding  breast symptoms.    PHYSICAL EXAM:  Wt Readings from Last 3 Encounters:  12/19/19 277 lb 6.4 oz (125.8 kg)  06/20/19 273 lb 9.6 oz (124.1 kg)  03/06/19 270 lb 6.4 oz (122.7 kg)    In general this is a well appearing African American female in no acute distress. She's alert and oriented x4 and appropriate throughout the examination. Cardiopulmonary assessment is negative for acute distress and she exhibits normal effort. Bilateral breast exam is deferred.    ECOG = 0  0 - Asymptomatic (Fully active, able to carry on all predisease activities without restriction)  1 - Symptomatic but completely ambulatory (Restricted in physically strenuous activity but ambulatory and able to carry out work of a light or sedentary nature. For example, light housework, office  work)  2 - Symptomatic, <50% in bed during the day (Ambulatory and capable of all self care but unable to carry out any work activities. Up and about more than 50% of waking hours)  3 - Symptomatic, >50% in bed, but not bedbound (Capable of only limited self-care, confined to bed or chair 50% or more of waking hours)  4 - Bedbound (Completely disabled. Cannot carry on any self-care. Totally confined to bed or chair)  5 - Death   Eustace Pen MM, Creech RH, Tormey DC, et al. [phone]). "Toxicity and response criteria of the [facility]". [place] Oncol. 5 (6): 649-55    LABORATORY DATA:  Lab Results  Component Value Date   WBC 7.1 09/04/2018   HGB 14.0 09/04/2018   HCT 42.5 09/04/2018   MCV 89 09/04/2018   PLT 347 09/04/2018   Lab Results  Component Value Date   NA 139 06/20/2019   K 3.9 06/20/2019   CL 98 06/20/2019   CO2 27 06/20/2019   Lab Results  Component Value Date   ALT 20 06/20/2019   AST 18 06/20/2019   ALKPHOS 117 06/20/2019   BILITOT 0.3 06/20/2019      RADIOGRAPHY: MR BREAST BILATERAL W WO CONTRAST INC CAD  Result Date: 12/25/2019 CLINICAL DATA:  Recent diagnosis of RIGHT  breast cancer. Ultrasound-guided biopsies performed on 12/13/2019 revealed invasive mammary carcinoma of the RIGHT breast at the 12 o'clock axis and invasive mammary carcinoma of the RIGHT breast at the 11:30 o'clock axis. A RIGHT axillary lymph node was biopsied. Pathology result was negative for carcinoma but this pathology result was deemed to be discordant with excision recommended. Family history of breast cancer, including sister at approximately 45 years old. LABS:  Not provided EXAM: BILATERAL BREAST MRI WITH AND WITHOUT CONTRAST TECHNIQUE: Multiplanar, multisequence MR images of both breasts were obtained prior to and following the intravenous administration of 10 ml of Gadavist Three-dimensional MR images were rendered by post-processing of the original MR data on an independent workstation. The three-dimensional MR images were interpreted, and findings are reported in the following complete MRI report for this study. Three dimensional images were evaluated at the independent interpreting workstation using the DynaCAD thin client. COMPARISON:  No previous breast MRI. Previous mammograms and ultrasound including ultrasound-guided biopsies dated 12/13/2019 FINDINGS: Breast composition: c. Heterogeneous fibroglandular tissue. Background parenchymal enhancement: Moderate. Right breast: Grouped irregular enhancing masses within the upper RIGHT breast, anterior to middle depth, corresponding to the biopsy-proven invasive carcinomas at the 12 o'clock and 11:30 o'clock axes, with associated biopsy clip artifacts, overall measuring 5.4 x 4.5 x 4.3 cm (transverse by AP by craniocaudal dimensions). Nearly contiguous mass and non-mass enhancement extend 3.3 cm anterior-inferior to the biopsy proven invasive carcinomas, to anterior depth just above the level of the nipple (most peripheral component on image 80 of series 6), with persistent enhancement kinetics, suspicious for additional extent of disease which would  increase the overall greatest measurement to 7.6 cm (oblique reformat series 100, images 266 and 267). No additional enhancing mass, suspicious non-mass enhancement or secondary signs of malignancy elsewhere within the RIGHT breast. Left breast: Irregular enhancing mass within the slightly upper LEFT breast, at anterior depth, measuring 8 mm, with mixed enhancement kinetics including suspicious washout (series 6, image 92). No additional suspicious enhancing mass, non-mass enhancement or secondary signs of malignancy are identified in the LEFT breast. Lymph nodes: Single morphologically abnormal lymph node within the RIGHT axilla, with cortical thickness measuring 7  mm, with associated biopsy clip artifact corresponding to the biopsied lymph node (discordant benign pathology result). No additional enlarged or morphologically abnormal lymph nodes are identified within either axillary region or internal mammary chain regions. Ancillary findings:  None. IMPRESSION: 1. Biopsy-proven invasive carcinomas (grouped irregular masses with associated biopsy clips) located within the RIGHT breast at the 12 o'clock axis and 11:30 o'clock axis, overall measuring 5.4 x 4.5 x 4.3 cm. 2. Additional nearly contiguous mass and non-mass enhancement extending 3.3 cm anterior-inferior to the biopsy-proven invasive carcinomas in the upper RIGHT breast, to anterior depth just above the level of the nipple, highly suspicious for additional extent of disease, which would increase the overall greatest measurement to 7.6 cm. If breast conservation surgery is considered for the RIGHT breast, recommend MRI-guided biopsy of the most peripheral component which is seen on image 80 of series 6. 3. Irregular enhancing mass within the slightly upper LEFT breast, at anterior depth, measuring 8 mm, with suspicious enhancement kinetics (series 6, image 92). This is suspicious for contralateral disease. MRI-guided biopsy is recommended. 4. Single  morphologically abnormal lymph node within the RIGHT axilla, with cortical thickness measuring 7 mm, with associated biopsy clip artifact corresponding to the recently biopsied axillary lymph node (discordant benign pathology result) RECOMMENDATION: 1. MRI-guided biopsy for the suspicious enhancing mass located within the slightly upper LEFT breast, at anterior depth, measuring 8 mm. 2. If breast conservation surgery is considered for the RIGHT breast, recommend additional MRI-guided biopsy of the most peripheral component of the suspicious mass and non-mass enhancement extending 3.3 cm anterior-inferior to the biopsy-proven carcinomas in the upper RIGHT breast. 3. Recommend surgical excision of the recent biopsied RIGHT axillary lymph node (discordant benign pathology result with surgical excision recommended). BI-RADS CATEGORY  4: Suspicious. Electronically Signed   By: Franki Cabot M.D.   On: 12/25/2019 11:56   US BREAST LTD UNI RIGHT INC AXILLA  Result Date: 11/30/2019 CLINICAL DATA:  Recall from screening for a possible right breast mass and a possible enlarged right axillary lymph node. EXAM: DIGITAL DIAGNOSTIC RIGHT MAMMOGRAM WITH CAD AND TOMO ULTRASOUND RIGHT BREAST COMPARISON:  Previous exam(s). ACR Breast Density Category c: The breast tissue is heterogeneously dense, which may obscure small masses. FINDINGS: There is a poorly defined mass in the upper outer right breast that persists on the diagnostic imaging, what may be a conglomeration of masses, spanning approximately 5.8 cm in greatest dimension. The mass lies anterior to a dumbbell shaped biopsy clip from a stereotactic core needle biopsy performed in 2013. There are no other masses. There are no suspicious calcifications. Mammographic images were processed with CAD. On physical exam, there is a firm tender mass in the upper outer right breast. Targeted ultrasound is performed, showing a hypoechoic irregular mass with ill-defined margins and  significant internal vascularity on color Doppler analysis, centered at 11:30 o'clock, 4 cm the nipple, measuring 4.3 x 3.0 x 3.4 cm. In close proximity is a satellite mass, markedly hypoechoic, but with internal blood flow on color Doppler analysis, lying at 12 o'clock, 5 cm the nipple, measuring 1.9 x 1.4 x 1.5 cm. The total span between the 11:30 o'clock mass in the 12 o'clock mass is 6.4 cm. Sonographic evaluation of the right axilla shows a single abnormal lymph node with a cortex thickened to 8 mm. IMPRESSION: 1. Highly suspicious mass, what appears to be a conglomeration of masses, in the right breast at 11:30 o'clock, spanning 4.3 cm, with a highly suspicious satellite mass at  12 o'clock, measuring 1.9 cm. There is also an abnormal right axillary lymph node with a maximal cortical thickness of 8 mm. RECOMMENDATION: 1. Three ultrasound-guided core needle biopsies is recommended. Biopsy of the primary mass at 11:30 o'clock as well as biopsy of the smaller 12 o'clock position mass is recommended as well as biopsy of the single abnormal right axillary lymph node. Right breast and axillary lymph node biopsies have been scheduled for October 14th. I have discussed the findings and recommendations with the patient. If applicable, a reminder letter will be sent to the patient regarding the next appointment. BI-RADS CATEGORY  5: Highly suggestive of malignancy. Electronically Signed   By: Lajean Manes M.D.   On: 11/30/2019 15:31   MM DIAG BREAST TOMO UNI RIGHT  Result Date: 11/30/2019 CLINICAL DATA:  Recall from screening for a possible right breast mass and a possible enlarged right axillary lymph node. EXAM: DIGITAL DIAGNOSTIC RIGHT MAMMOGRAM WITH CAD AND TOMO ULTRASOUND RIGHT BREAST COMPARISON:  Previous exam(s). ACR Breast Density Category c: The breast tissue is heterogeneously dense, which may obscure small masses. FINDINGS: There is a poorly defined mass in the upper outer right breast that persists on  the diagnostic imaging, what may be a conglomeration of masses, spanning approximately 5.8 cm in greatest dimension. The mass lies anterior to a dumbbell shaped biopsy clip from a stereotactic core needle biopsy performed in 2013. There are no other masses. There are no suspicious calcifications. Mammographic images were processed with CAD. On physical exam, there is a firm tender mass in the upper outer right breast. Targeted ultrasound is performed, showing a hypoechoic irregular mass with ill-defined margins and significant internal vascularity on color Doppler analysis, centered at 11:30 o'clock, 4 cm the nipple, measuring 4.3 x 3.0 x 3.4 cm. In close proximity is a satellite mass, markedly hypoechoic, but with internal blood flow on color Doppler analysis, lying at 12 o'clock, 5 cm the nipple, measuring 1.9 x 1.4 x 1.5 cm. The total span between the 11:30 o'clock mass in the 12 o'clock mass is 6.4 cm. Sonographic evaluation of the right axilla shows a single abnormal lymph node with a cortex thickened to 8 mm. IMPRESSION: 1. Highly suspicious mass, what appears to be a conglomeration of masses, in the right breast at 11:30 o'clock, spanning 4.3 cm, with a highly suspicious satellite mass at 12 o'clock, measuring 1.9 cm. There is also an abnormal right axillary lymph node with a maximal cortical thickness of 8 mm. RECOMMENDATION: 1. Three ultrasound-guided core needle biopsies is recommended. Biopsy of the primary mass at 11:30 o'clock as well as biopsy of the smaller 12 o'clock position mass is recommended as well as biopsy of the single abnormal right axillary lymph node. Right breast and axillary lymph node biopsies have been scheduled for October 14th. I have discussed the findings and recommendations with the patient. If applicable, a reminder letter will be sent to the patient regarding the next appointment. BI-RADS CATEGORY  5: Highly suggestive of malignancy. Electronically Signed   By: Lajean Manes  M.D.   On: 11/30/2019 15:31   [place] AXILLARY NODE CORE BIOPSY RIGHT  Addendum Date: 12/17/2019   ADDENDUM REPORT: 12/17/2019 08:50 ADDENDUM: Pathology revealed 1- INVASIVE MAMMARY CARCINOMA of the RIGHT breast, 12 o'clock. This was found to be concordant by Dr. Everlean Alstrom. Pathology revealed 2- INVASIVE MAMMARY CARCINOMA of the RIGHT breast, 11:30 o'clock. This was found to be concordant by Dr. Everlean Alstrom. Pathology revealed 3- ONE LYMPH NODE, NEGATIVE  FOR CARCINOMA (0/1) of the RIGHT axilla. This was found to be discordant by Dr. Everlean Alstrom, with excision recommended. Microscopic Comment: 1. , 2. For each specimen, the greatest linear extent of tumor in any one core is approximately 14 mm. Both specimens exhibit high nuclear grade, yet different overall morphologies. E-cadherin and ancillary studies will be performed on each specimen. Pathology results were discussed with the patient by telephone. The patient reported doing well after the biopsies with tenderness at the sites. Post biopsy instructions and care were reviewed and questions were answered. The patient was encouraged to call [facility] for any additional concerns. Surgical consultation has been arranged with Dr. Stark Klein at [facility] on December 17, 2019. Pathology results reported by Stacie Acres RN on 12/17/2019. Electronically Signed   By: Everlean Alstrom M.D.   On: 12/17/2019 08:50   Result Date: 12/17/2019 CLINICAL DATA:  49 year old female with a suspicious mass in the right breast at the 12 o'clock position, suspicious mass in the right breast at the 11:30 position and an abnormal lymph node in the right axilla. EXAM: ULTRASOUND GUIDED RIGHT BREAST CORE NEEDLE BIOPSY ULTRASOUND-GUIDED RIGHT AXILLA CORE NEEDLE BIOPSY COMPARISON:  Previous exam(s). PROCEDURE: I met with the patient and we discussed the procedure of ultrasound-guided biopsy, including benefits and alternatives. We  discussed the high likelihood of a successful procedure. We discussed the risks of the procedure, including infection, bleeding, tissue injury, clip migration, and inadequate sampling. Informed written consent was given. The usual time-out protocol was performed immediately prior to the procedure. SITE 1: RIGHT BREAST 12 O'CLOCK: MASS: Lesion quadrant: UPPER INNER Using sterile technique and 1% Lidocaine as local anesthetic, under direct ultrasound visualization, a 14 gauge spring-loaded device was used to perform biopsy of the mass in the right breast at the 12 o'clock position using a lateral to medial approach. At the conclusion of the procedure a heart shaped tissue marker clip was deployed into the biopsy cavity. Follow up 2 view mammogram was performed and dictated separately. SITE 2: RIGHT BREAST 11:30 POSITION: MASS: Lesion quadrant: UPPER-OUTER Using sterile technique and 1% Lidocaine as local anesthetic, under direct ultrasound visualization, a 14 gauge spring-loaded device was used to perform biopsy of the mass in the right breast at the 11:30 position using a lateral to medial approach. At the conclusion of the procedure a coil shaped tissue marker clip was deployed into the biopsy cavity. Follow up 2 view mammogram was performed and dictated separately. SITE 3: RIGHT AXILLA: LYMPH NODE: Lesion quadrant: UPPER-OUTER Using sterile technique and 1% Lidocaine as local anesthetic, under direct ultrasound visualization, a 14 gauge spring-loaded device was used to perform biopsy of the abnormal lymph node in the right axilla using a inferolateral to superomedial approach. At the conclusion of the procedure a spiral shaped HydroMARK tissue marker clip was deployed into the biopsy cavity. Follow up 2 view mammogram was performed and dictated separately. IMPRESSION: 1. Ultrasound-guided biopsy of the mass in the right breast at the 12 o'clock position, at site of heart shaped biopsy marking clip. 2.  Ultrasound-guided biopsy of the mass in the right breast at the 11:30 position, at site of coil shaped biopsy marking clip. 3. Ultrasound-guided biopsy of the abnormal lymph node in the right axilla, at site of spiral shaped HydroMARK biopsy marking clip. Electronically Signed: By: Everlean Alstrom M.D. On: 12/13/2019 08:29   MM CLIP PLACEMENT RIGHT  Result Date: 12/13/2019 CLINICAL DATA:  Post ultrasound-guided biopsy  of a suspicious mass in the right breast at the 12 o'clock position, suspicious mass in the right breast at the 11:30 position and abnormal right axillary lymph node. EXAM: DIAGNOSTIC RIGHT MAMMOGRAM POST ULTRASOUND BIOPSY COMPARISON:  Previous exams. FINDINGS: Mammographic images were obtained following ultrasound-guided biopsy of a suspicious mass in the right breast at the 12 o'clock position, suspicious mass in the right breast at the 11:30 position and abnormal right axillary lymph node. A heart shaped biopsy marking clip is present at the site of the biopsied mass in the right breast at the 12 o'clock position. A coil shaped biopsy marking clip is present at the site of the biopsied mass at the 11:30 position. A spiral shaped HydroMARK clip is present at the site of the biopsied abnormal lymph node in the right axilla. IMPRESSION: 1. Heart shaped biopsy marking clip at site of biopsied mass in the right breast at the 12 o'clock position. 2. Coil shaped biopsy marking clip at site of biopsied mass in the right breast at the 11:30 position. 3. Spiral shaped HydroMARK clip at site of biopsied lymph node in the right axilla. Final Assessment: Post Procedure Mammograms for Marker Placement Electronically Signed   By: Everlean Alstrom M.D.   On: 12/13/2019 08:56   [place] RT BREAST BX W LOC DEV 1ST LESION IMG BX SPEC US GUIDE  Addendum Date: 12/17/2019   ADDENDUM REPORT: 12/17/2019 08:50 ADDENDUM: Pathology revealed 1- INVASIVE MAMMARY CARCINOMA of the RIGHT breast, 12 o'clock. This was found to  be concordant by Dr. Everlean Alstrom. Pathology revealed 2- INVASIVE MAMMARY CARCINOMA of the RIGHT breast, 11:30 o'clock. This was found to be concordant by Dr. Everlean Alstrom. Pathology revealed 3- ONE LYMPH NODE, NEGATIVE FOR CARCINOMA (0/1) of the RIGHT axilla. This was found to be discordant by Dr. Everlean Alstrom, with excision recommended. Microscopic Comment: 1. , 2. For each specimen, the greatest linear extent of tumor in any one core is approximately 14 mm. Both specimens exhibit high nuclear grade, yet different overall morphologies. E-cadherin and ancillary studies will be performed on each specimen. Pathology results were discussed with the patient by telephone. The patient reported doing well after the biopsies with tenderness at the sites. Post biopsy instructions and care were reviewed and questions were answered. The patient was encouraged to call [facility] for any additional concerns. Surgical consultation has been arranged with Dr. Stark Klein at [facility] on December 17, 2019. Pathology results reported by Stacie Acres RN on 12/17/2019. Electronically Signed   By: Everlean Alstrom M.D.   On: 12/17/2019 08:50   Result Date: 12/17/2019 CLINICAL DATA:  49 year old female with a suspicious mass in the right breast at the 12 o'clock position, suspicious mass in the right breast at the 11:30 position and an abnormal lymph node in the right axilla. EXAM: ULTRASOUND GUIDED RIGHT BREAST CORE NEEDLE BIOPSY ULTRASOUND-GUIDED RIGHT AXILLA CORE NEEDLE BIOPSY COMPARISON:  Previous exam(s). PROCEDURE: I met with the patient and we discussed the procedure of ultrasound-guided biopsy, including benefits and alternatives. We discussed the high likelihood of a successful procedure. We discussed the risks of the procedure, including infection, bleeding, tissue injury, clip migration, and inadequate sampling. Informed written consent was given. The usual time-out  protocol was performed immediately prior to the procedure. SITE 1: RIGHT BREAST 12 O'CLOCK: MASS: Lesion quadrant: UPPER INNER Using sterile technique and 1% Lidocaine as local anesthetic, under direct ultrasound visualization, a 14 gauge spring-loaded device was used to  perform biopsy of the mass in the right breast at the 12 o'clock position using a lateral to medial approach. At the conclusion of the procedure a heart shaped tissue marker clip was deployed into the biopsy cavity. Follow up 2 view mammogram was performed and dictated separately. SITE 2: RIGHT BREAST 11:30 POSITION: MASS: Lesion quadrant: UPPER-OUTER Using sterile technique and 1% Lidocaine as local anesthetic, under direct ultrasound visualization, a 14 gauge spring-loaded device was used to perform biopsy of the mass in the right breast at the 11:30 position using a lateral to medial approach. At the conclusion of the procedure a coil shaped tissue marker clip was deployed into the biopsy cavity. Follow up 2 view mammogram was performed and dictated separately. SITE 3: RIGHT AXILLA: LYMPH NODE: Lesion quadrant: UPPER-OUTER Using sterile technique and 1% Lidocaine as local anesthetic, under direct ultrasound visualization, a 14 gauge spring-loaded device was used to perform biopsy of the abnormal lymph node in the right axilla using a inferolateral to superomedial approach. At the conclusion of the procedure a spiral shaped HydroMARK tissue marker clip was deployed into the biopsy cavity. Follow up 2 view mammogram was performed and dictated separately. IMPRESSION: 1. Ultrasound-guided biopsy of the mass in the right breast at the 12 o'clock position, at site of heart shaped biopsy marking clip. 2. Ultrasound-guided biopsy of the mass in the right breast at the 11:30 position, at site of coil shaped biopsy marking clip. 3. Ultrasound-guided biopsy of the abnormal lymph node in the right axilla, at site of spiral shaped HydroMARK biopsy marking  clip. Electronically Signed: By: Everlean Alstrom M.D. On: 12/13/2019 08:29   [place] RT BREAST BX W LOC DEV EA ADD LESION IMG BX SPEC US GUIDE  Addendum Date: 12/17/2019   ADDENDUM REPORT: 12/17/2019 08:50 ADDENDUM: Pathology revealed 1- INVASIVE MAMMARY CARCINOMA of the RIGHT breast, 12 o'clock. This was found to be concordant by Dr. Everlean Alstrom. Pathology revealed 2- INVASIVE MAMMARY CARCINOMA of the RIGHT breast, 11:30 o'clock. This was found to be concordant by Dr. Everlean Alstrom. Pathology revealed 3- ONE LYMPH NODE, NEGATIVE FOR CARCINOMA (0/1) of the RIGHT axilla. This was found to be discordant by Dr. Everlean Alstrom, with excision recommended. Microscopic Comment: 1. , 2. For each specimen, the greatest linear extent of tumor in any one core is approximately 14 mm. Both specimens exhibit high nuclear grade, yet different overall morphologies. E-cadherin and ancillary studies will be performed on each specimen. Pathology results were discussed with the patient by telephone. The patient reported doing well after the biopsies with tenderness at the sites. Post biopsy instructions and care were reviewed and questions were answered. The patient was encouraged to call [facility] for any additional concerns. Surgical consultation has been arranged with Dr. Stark Klein at [facility] on December 17, 2019. Pathology results reported by Stacie Acres RN on 12/17/2019. Electronically Signed   By: Everlean Alstrom M.D.   On: 12/17/2019 08:50   Result Date: 12/17/2019 CLINICAL DATA:  49 year old female with a suspicious mass in the right breast at the 12 o'clock position, suspicious mass in the right breast at the 11:30 position and an abnormal lymph node in the right axilla. EXAM: ULTRASOUND GUIDED RIGHT BREAST CORE NEEDLE BIOPSY ULTRASOUND-GUIDED RIGHT AXILLA CORE NEEDLE BIOPSY COMPARISON:  Previous exam(s). PROCEDURE: I met with the patient and we discussed the  procedure of ultrasound-guided biopsy, including benefits and alternatives. We discussed the high likelihood of a successful procedure. We discussed  the risks of the procedure, including infection, bleeding, tissue injury, clip migration, and inadequate sampling. Informed written consent was given. The usual time-out protocol was performed immediately prior to the procedure. SITE 1: RIGHT BREAST 12 O'CLOCK: MASS: Lesion quadrant: UPPER INNER Using sterile technique and 1% Lidocaine as local anesthetic, under direct ultrasound visualization, a 14 gauge spring-loaded device was used to perform biopsy of the mass in the right breast at the 12 o'clock position using a lateral to medial approach. At the conclusion of the procedure a heart shaped tissue marker clip was deployed into the biopsy cavity. Follow up 2 view mammogram was performed and dictated separately. SITE 2: RIGHT BREAST 11:30 POSITION: MASS: Lesion quadrant: UPPER-OUTER Using sterile technique and 1% Lidocaine as local anesthetic, under direct ultrasound visualization, a 14 gauge spring-loaded device was used to perform biopsy of the mass in the right breast at the 11:30 position using a lateral to medial approach. At the conclusion of the procedure a coil shaped tissue marker clip was deployed into the biopsy cavity. Follow up 2 view mammogram was performed and dictated separately. SITE 3: RIGHT AXILLA: LYMPH NODE: Lesion quadrant: UPPER-OUTER Using sterile technique and 1% Lidocaine as local anesthetic, under direct ultrasound visualization, a 14 gauge spring-loaded device was used to perform biopsy of the abnormal lymph node in the right axilla using a inferolateral to superomedial approach. At the conclusion of the procedure a spiral shaped HydroMARK tissue marker clip was deployed into the biopsy cavity. Follow up 2 view mammogram was performed and dictated separately. IMPRESSION: 1. Ultrasound-guided biopsy of the mass in the right breast at the 12  o'clock position, at site of heart shaped biopsy marking clip. 2. Ultrasound-guided biopsy of the mass in the right breast at the 11:30 position, at site of coil shaped biopsy marking clip. 3. Ultrasound-guided biopsy of the abnormal lymph node in the right axilla, at site of spiral shaped HydroMARK biopsy marking clip. Electronically Signed: By: Everlean Alstrom M.D. On: 12/13/2019 08:29       IMPRESSION/PLAN: 1. 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 Dr. Lisbeth Renshaw discusses the pathology findings and reviews the nature of right breast disease. The consensus from the breast conference included MRI which now shows a role for rebiopsying her right axillary node and new left breast mass. She is hoping to proceed with neoadjuvant chemotherapy followed by breast conservation with lumpectomy and limited lymphatic surgery.  Dr. Lisbeth Renshaw reviews the rationale for radiotherapy in patients who undergo breast conserving surgery, and those who have node involvement. We discussed the risks, benefits, short, and long term effects of radiotherapy, and the patient is interested in proceeding at the appropriate interval in several months. Dr. Lisbeth Renshaw discusses the delivery and logistics of radiotherapy and anticipates a course of 6-1/2 weeks of radiotherapy with the sites including her right breast +/- regional nodes depending on her biopsy next week. We will see her back a few weeks after surgery to discuss the simulation process and anticipate we starting radiotherapy about 4-6 weeks after surgery.  2. Left breast mass. As above this will be formally worked up with biopsy scheduled next week Wednesday. We will follow along with these results.   This encounter was provided by telemedicine platform Mychart but did switch to telephone visit during the encounter due to audio connection issues.  The patient has provided two factor identification and has given verbal consent for  this type of encounter and has been advised  to only accept a meeting of this type in a secure network environment. The time spent during this encounter was 60 minutes including preparation, discussion, and coordination of the patient's care. The attendants for this meeting include Blenda Nicely, RN, Dr. Lisbeth Renshaw, Hayden Pedro  and Livia Snellen.  During the encounter,  Blenda Nicely, RN, Dr. Lisbeth Renshaw, and Hayden Pedro were located at [facility] Radiation Oncology Department.  Teagen Minyon Billiter was located at home.    The above documentation reflects my direct findings during this shared patient visit. Please see the separate note by Dr. Lisbeth Renshaw on this date for the remainder of the patient's plan of care.    Carola Rhine, PAC

## 2019-12-27 ENCOUNTER — Ambulatory Visit (HOSPITAL_COMMUNITY)
Admission: RE | Admit: 2019-12-27 | Discharge: 2019-12-27 | Disposition: A | Discharge status: Home / Self Care | Payer: BC Managed Care – PPO | Source: Ambulatory Visit | Attending: Hematology and Oncology | Admitting: Hematology and Oncology

## 2019-12-27 ENCOUNTER — Inpatient Hospital Stay: Payer: BC Managed Care – PPO

## 2019-12-27 ENCOUNTER — Other Ambulatory Visit: Payer: Self-pay

## 2019-12-27 DIAGNOSIS — Z170 Estrogen receptor positive status [ER+]: Secondary | ICD-10-CM | POA: Insufficient documentation

## 2019-12-27 DIAGNOSIS — Z0189 Encounter for other specified special examinations: Secondary | ICD-10-CM | POA: Diagnosis not present

## 2019-12-27 DIAGNOSIS — C50411 Malignant neoplasm of upper-outer quadrant of right female breast: Secondary | ICD-10-CM | POA: Diagnosis not present

## 2019-12-27 DIAGNOSIS — I10 Essential (primary) hypertension: Secondary | ICD-10-CM | POA: Diagnosis not present

## 2019-12-27 DIAGNOSIS — Z01818 Encounter for other preprocedural examination: Secondary | ICD-10-CM | POA: Insufficient documentation

## 2019-12-27 DIAGNOSIS — E119 Type 2 diabetes mellitus without complications: Secondary | ICD-10-CM | POA: Diagnosis not present

## 2019-12-27 LAB — ECHOCARDIOGRAM COMPLETE
Area-P 1/2: 3.99 cm2
S' Lateral: 2.6 cm

## 2019-12-27 NOTE — Progress Notes (Signed)
  Echocardiogram 2D Echocardiogram has been performed.  Cathy Parrish 12/27/2019, 12:20 PM

## 2019-12-28 ENCOUNTER — Encounter (HOSPITAL_BASED_OUTPATIENT_CLINIC_OR_DEPARTMENT_OTHER)
Admission: RE | Admit: 2019-12-28 | Discharge: 2019-12-28 | Disposition: A | Discharge status: Home / Self Care | Payer: BC Managed Care – PPO | Source: Ambulatory Visit | Attending: General Surgery | Admitting: General Surgery

## 2019-12-28 DIAGNOSIS — E119 Type 2 diabetes mellitus without complications: Secondary | ICD-10-CM | POA: Diagnosis not present

## 2019-12-28 DIAGNOSIS — J45909 Unspecified asthma, uncomplicated: Secondary | ICD-10-CM | POA: Diagnosis not present

## 2019-12-28 DIAGNOSIS — F329 Major depressive disorder, single episode, unspecified: Secondary | ICD-10-CM | POA: Diagnosis not present

## 2019-12-28 DIAGNOSIS — Z01818 Encounter for other preprocedural examination: Secondary | ICD-10-CM | POA: Diagnosis not present

## 2019-12-28 DIAGNOSIS — Z79899 Other long term (current) drug therapy: Secondary | ICD-10-CM | POA: Diagnosis not present

## 2019-12-28 DIAGNOSIS — C50411 Malignant neoplasm of upper-outer quadrant of right female breast: Secondary | ICD-10-CM | POA: Diagnosis not present

## 2019-12-28 DIAGNOSIS — Z170 Estrogen receptor positive status [ER+]: Secondary | ICD-10-CM | POA: Diagnosis not present

## 2019-12-28 DIAGNOSIS — I10 Essential (primary) hypertension: Secondary | ICD-10-CM | POA: Diagnosis not present

## 2019-12-28 DIAGNOSIS — Z7984 Long term (current) use of oral hypoglycemic drugs: Secondary | ICD-10-CM | POA: Diagnosis not present

## 2019-12-28 LAB — BASIC METABOLIC PANEL
Anion gap: 10 (ref 5–15)
BUN: 10 mg/dL (ref 6–20)
CO2: 27 mmol/L (ref 22–32)
Calcium: 9.6 mg/dL (ref 8.9–10.3)
Chloride: 100 mmol/L (ref 98–111)
Creatinine, Ser: 0.56 mg/dL (ref 0.44–1.00)
GFR, Estimated: >60 mL/min (ref >60)
Glucose, Bld: 107 mg/dL — ABNORMAL HIGH (ref 70–99)
Potassium: 4.1 mmol/L (ref 3.5–5.1)
Sodium: 137 mmol/L (ref 135–145)

## 2019-12-28 NOTE — Progress Notes (Signed)
Pharmacist Chemotherapy Monitoring - Initial Assessment    Anticipated start date: 01/03/20  Regimen:  . Are orders appropriate based on the patient's diagnosis, regimen, and cycle? Yes . Does the plan date match the patient's scheduled date? Yes . Is the sequencing of drugs appropriate? Yes . Are the premedications appropriate for the patient's regimen? Yes . Prior Authorization for treatment is: Approved o If applicable, is the correct biosimilar selected based on the patient's insurance? yes  Organ Function and Labs: Marland Kitchen Are dose adjustments needed based on the patient's renal function, hepatic function, or hematologic function? Yes . Are appropriate labs ordered prior to the start of patient's treatment? Yes . Other organ system assessment, if indicated: anthracyclines: Echo/ MUGA and doxorubicin liposomal: Echo/ MUGA . The following baseline labs, if indicated, have been ordered: N/A  Dose Assessment: . Are the drug doses appropriate? Yes . Are the following correct: o Drug concentrations Yes o IV fluid compatible with drug Yes o Administration routes Yes o Timing of therapy Yes . If applicable, does the patient have documented access for treatment and/or plans for port-a-cath placement? yes . If applicable, have lifetime cumulative doses been properly documented and assessed? yes Lifetime Dose Tracking  No doses have been documented on this patient for the following tracked chemicals: Doxorubicin, Epirubicin, Idarubicin, Daunorubicin, Mitoxantrone, Bleomycin, Oxaliplatin, Carboplatin, Liposomal Doxorubicin  o   Toxicity Monitoring/Prevention: . The patient has the following take home antiemetics prescribed: Prochlorperazine . The patient has the following take home medications prescribed: N/A . Medication allergies and previous infusion related reactions, if applicable, have been reviewed and addressed. Yes . The patient's current medication list has been assessed for drug-drug  interactions with their chemotherapy regimen. no significant drug-drug interactions were identified on review.  Order Review: . Are the treatment plan orders signed? Yes . Is the patient scheduled to see a provider prior to their treatment? No  I verify that I have reviewed each item in the above checklist and answered each question accordingly.  Philomena Course 12/28/2019 1:19 PM

## 2019-12-28 NOTE — Progress Notes (Signed)

## 2019-12-31 ENCOUNTER — Telehealth: Payer: Self-pay | Admitting: *Deleted

## 2019-12-31 ENCOUNTER — Other Ambulatory Visit: Payer: Self-pay | Admitting: *Deleted

## 2019-12-31 ENCOUNTER — Other Ambulatory Visit: Payer: BC Managed Care – PPO

## 2019-12-31 ENCOUNTER — Other Ambulatory Visit (HOSPITAL_COMMUNITY): Payer: BC Managed Care – PPO

## 2019-12-31 ENCOUNTER — Other Ambulatory Visit (HOSPITAL_COMMUNITY)
Admission: RE | Admit: 2019-12-31 | Discharge: 2019-12-31 | Disposition: A | Discharge status: Home / Self Care | Payer: BC Managed Care – PPO | Source: Ambulatory Visit | Attending: General Surgery | Admitting: General Surgery

## 2019-12-31 DIAGNOSIS — Z20822 Contact with and (suspected) exposure to covid-19: Secondary | ICD-10-CM | POA: Diagnosis not present

## 2019-12-31 DIAGNOSIS — Z01812 Encounter for preprocedural laboratory examination: Secondary | ICD-10-CM | POA: Diagnosis not present

## 2019-12-31 DIAGNOSIS — Z170 Estrogen receptor positive status [ER+]: Secondary | ICD-10-CM

## 2019-12-31 MED ORDER — LORAZEPAM 0.5 MG PO TABS: 0.5000 mg | ORAL_TABLET | Freq: Every evening | ORAL | 0 refills | Status: DC | PRN

## 2019-12-31 NOTE — H&P (Signed)
Cathy Parrish Appointment: 12/17/2019 11:45 AM Location: [facility] Patient #: [id number] DOB: 01/28/1971 Single / Language: Cathy Parrish / Race: Black or African American Female   History of Present Illness Cathy Klein MD; 12/25/2019 5:01 PM) The patient is a 49 year old female who presents with breast cancer. Pt is a 49 yo M referred for consultation by Dr. Shelly Bombard for a new diagnosis of RIGHT breast cancer. This was detected on a screening mammogram, but was palpable on diagnostic mammo and ultrasound. She had a poorly defined region in the upper outer quadrant that appeared to be multiple masses together on ultrasound with the total span being 6.4 cm. There was a 4.3 cm mass at 11:30 and a 1.9 cm mass at 12 o'clock. She also had a single abnormal lymph node seen on the right. 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 The lymph node pathology was negative, but discordant.  Of note, she had a prior core needle biopsy in 2013 with a residual clip that was just posterior to the abnormal region this time. Mammograms were performed last september (2020) and she required LEFT diagnostic mammogram with biopsies positive for fibroadenoma and reactive LN.   She has no personal history of cancer before this diagnosis, but her sister was diagnosed with breast cancer in her 39s. She is a G2P1 wtih child born in her early 110s. Menarche was age 68.   dx mammo/us 11/30/2019 ACR Breast Density Category c: The breast tissue is heterogeneously dense, which may obscure small masses.  FINDINGS: There is a poorly defined mass in the upper outer right breast that persists on the diagnostic imaging, what may be a conglomeration of masses, spanning approximately 5.8 cm in greatest dimension. The mass lies anterior to a dumbbell shaped biopsy clip from a stereotactic core needle biopsy performed  in 2013. There are no other masses. There are no suspicious calcifications.  Mammographic images were processed with CAD.  On physical exam, there is a firm tender mass in the upper outer right breast.  Targeted ultrasound is performed, showing a hypoechoic irregular mass with ill-defined margins and significant internal vascularity on color Doppler analysis, centered at 11:30 o'clock, 4 cm the nipple, measuring 4.3 x 3.0 x 3.4 cm. In close proximity is a satellite mass, markedly hypoechoic, but with internal blood flow on color Doppler analysis, lying at 12 o'clock, 5 cm the nipple, measuring 1.9 x 1.4 x 1.5 cm. The total span between the 11:30 o'clock mass in the 12 o'clock mass is 6.4 cm.  Sonographic evaluation of the right axilla shows a single abnormal lymph node with a cortex thickened to 8 mm.  IMPRESSION: 1. Highly suspicious mass, what appears to be a conglomeration of masses, in the right breast at 11:30 o'clock, spanning 4.3 cm, with a highly suspicious satellite mass at 12 o'clock, measuring 1.9 cm. There is also an abnormal right axillary lymph node with a maximal cortical thickness of 8 mm.  RECOMMENDATION: 1. Three ultrasound-guided core needle biopsies is recommended. Biopsy of the primary mass at 11:30 o'clock as well as biopsy of the smaller 12 o'clock position mass is recommended as well as biopsy of the single abnormal right axillary lymph node.  Right breast and axillary lymph node biopsies have been scheduled for October 14th.  I have discussed the findings and recommendations with the patient. If applicable, a reminder letter will be  sent to the patient regarding the next appointment.  BI-RADS CATEGORY 5: Highly suggestive of malignancy.  pathology 12/13/2019 1. Breast, right, needle core biopsy, 12 o'clock - INVASIVE MAMMARY CARCINOMA. The tumor cells are NEGATIVE for Her2 (0). Estrogen Receptor: 40%, POSITIVE, WEAK STAINING  INTENSITY Progesterone Receptor: 0%, NEGATIVE Proliferation Marker Ki67: 40%  2. Breast, right, needle core biopsy, 11:30 o'clock - INVASIVE MAMMARY CARCINOMA. The tumor cells are NEGATIVE for Her2 (0). Estrogen Receptor: 90%, POSITIVE, MODERATE-WEAK STAINING INTENSITY Progesterone Receptor: 0%, NEGATIVE Proliferation Marker Ki67: 40%  3. Lymph node, needle/core biopsy, right axilla - ONE LYMPH NODE, NEGATIVE FOR CARCINOMA (0/1).     Past Surgical History (Chanel Teressa Senter, [place]; 12/17/2019 12:12 PM) Colon Polyp Removal - Colonoscopy  Colon Polyp Removal - Open  Tonsillectomy   Diagnostic Studies History (Chanel Teressa Senter, CMA; 12/17/2019 12:12 PM) Colonoscopy  5-10 years ago 1-5 years ago Mammogram  within last year Pap Smear  1-5 years ago  Allergies (Chanel Teressa Senter, CMA; 12/17/2019 12:12 PM) No Known Drug Allergies  [11/26/2016]: Allergies Reconciled   Medication History (Chanel Teressa Senter, CMA; 12/17/2019 12:12 PM) Irbesartan-Hydrochlorothiazide (150-12.5MG Tablet, Oral) Active. Cefuroxime Axetil (250MG Tablet, Oral) Active. Etodolac (400MG Tablet, Oral) Active. Dicyclomine HCl (10MG Capsule, Oral) Active. Sertraline HCl (50MG Tablet, Oral) Active. Valsartan-Hydrochlorothiazide (160-12.5MG Tablet, Oral) Active. Medications Reconciled  Social History Antonietta Jewel, CMA; 12/17/2019 12:12 PM) Alcohol use  Occasional alcohol use. Caffeine use  Carbonated beverages, Tea. No drug use  Tobacco use  Never smoker.  Family History ([place], [place]; 12/17/2019 12:12 PM) Breast Cancer  Sister. Depression  Mother. Hypertension  Father, Mother.  Pregnancy / Birth History Antonietta Jewel, [place]; 12/17/2019 12:12 PM) Age at menarche  54 years. Gravida  2 Irregular periods  Maternal age  4-25 Para  56  Other Problems (Chanel Teressa Senter, CMA; 12/17/2019 12:12 PM) Anxiety Disorder  Arthritis  Cerebrovascular Accident  High blood pressure  Lump In Breast      Review of Systems (Chanel Nolan CMA; 12/17/2019 12:12 PM) General Present- Fatigue. Not Present- Appetite Loss, Chills, Fever, Night Sweats, Weight Gain and Weight Loss. Skin Not Present- Change in Wart/Mole, Dryness, Hives, Jaundice, New Lesions, Non-Healing Wounds, Rash and Ulcer. HEENT Not Present- Earache, Hearing Loss, Hoarseness, Nose Bleed, Oral Ulcers, Ringing in the Ears, Seasonal Allergies, Sinus Pain, Sore Throat, Visual Disturbances, Wears glasses/contact lenses and Yellow Eyes. Breast Present- Breast Mass and Breast Pain. Not Present- Nipple Discharge and Skin Changes. Cardiovascular Not Present- Chest Pain, Difficulty Breathing Lying Down, Leg Cramps, Palpitations, Rapid Heart Rate, Shortness of Breath and Swelling of Extremities. Gastrointestinal Not Present- Abdominal Pain, Bloating, Bloody Stool, Change in Bowel Habits, Chronic diarrhea, Constipation, Difficulty Swallowing, Excessive gas, Gets full quickly at meals, Hemorrhoids, Indigestion, Nausea, Rectal Pain and Vomiting. Female Genitourinary Not Present- Frequency, Nocturia, Painful Urination, Pelvic Pain and Urgency. Musculoskeletal Present- Joint Pain. Not Present- Back Pain, Joint Stiffness, Muscle Pain, Muscle Weakness and Swelling of Extremities. Neurological Not Present- Decreased Memory, Fainting, Headaches, Numbness, Seizures, Tingling, Tremor, Trouble walking and Weakness. Psychiatric Present- Anxiety and Depression. Not Present- Bipolar, Change in Sleep Pattern, Fearful and Frequent crying. Endocrine Present- Hot flashes. Not Present- Cold Intolerance, Excessive Hunger, Hair Changes, Heat Intolerance and New Diabetes. Hematology Not Present- Blood Thinners, Easy Bruising, Excessive bleeding, Gland problems, HIV and Persistent Infections.  Vitals (Chanel Nolan CMA; 12/17/2019 12:13 PM) 12/17/2019 12:12 PM Weight: 278.38 lb Height: 66in Body Surface Area: 2.3 m Body Mass Index: 44.93 kg/m  Temp.:  97.38F  Pulse: 102 (Regular)  Physical Exam Cathy Klein MD; 12/25/2019 5:05 PM) General Mental Status-Alert. General Appearance-Consistent with stated age. Hydration-Well hydrated. Voice-Normal.  Head and Neck Head-normocephalic, atraumatic with no lesions or palpable masses. Trachea-midline. Thyroid Gland Characteristics - normal size and consistency.  Eye Eyeball - Bilateral-Extraocular movements intact. Sclera/Conjunctiva - Bilateral-No scleral icterus.  Chest and Lung Exam Chest and lung exam reveals -quiet, even and easy respiratory effort with no use of accessory muscles and on auscultation, normal breath sounds, no adventitious sounds and normal vocal resonance. Inspection Chest Wall - Normal. Back - normal.  Breast Note: breasts are relatively symmetric in size, but the upper right breast is fuller on inspection. There is a large 6-7 cm mass palpable in the upper outer/central right breast. slight nipple retraction is present. left breast without palpable masses. non tender. No palpable LAD.   Cardiovascular Cardiovascular examination reveals -normal heart sounds, regular rate and rhythm with no murmurs and normal pedal pulses bilaterally.  Abdomen Inspection Inspection of the abdomen reveals - No Hernias. Palpation/Percussion Palpation and Percussion of the abdomen reveal - Soft, Non Tender, No Rebound tenderness, No Rigidity (guarding) and No hepatosplenomegaly. Auscultation Auscultation of the abdomen reveals - Bowel sounds normal.  Neurologic Neurologic evaluation reveals -alert and oriented x 3 with no impairment of recent or remote memory. Mental Status-Normal.  Musculoskeletal Global Assessment -Note: no gross deformities.  Normal Exam - Left-Upper Extremity Strength Normal and Lower Extremity Strength Normal. Normal Exam - Right-Upper Extremity Strength Normal and Lower Extremity Strength  Normal.  Lymphatic Head & Neck  General Head & Neck Lymphatics: Bilateral - Description - Normal. Axillary  General Axillary Region: Bilateral - Description - Normal. Tenderness - Non Tender. Femoral & Inguinal  Generalized Femoral & Inguinal Lymphatics: Bilateral - Description - No Generalized lymphadenopathy.    Assessment & Plan Cathy Klein MD; 12/25/2019 5:10 PM) MALIGNANT NEOPLASM OF UPPER-OUTER QUADRANT OF RIGHT BREAST IN FEMALE, ESTROGEN RECEPTOR POSITIVE (C50.411) Impression: Based on the size of the mass, it would be ideal if we could do neoadjuvant chemotherapy. I will also get an MR due to size, multifocality, and negative mammo last year. I suspect we will have to do a mastectomy, but if the mass downsizes, we might be able to do lumpectomy if patient desires.  She will need genetics referral. She will also likely need radiation even with mastectomy unless she truly has two masses, and not one confluent mass.  I will plan seed localized excision of the biopsied lymph node regardless of pre op post op chemotherapy as the path is discordant. She will also need a sentinel node biopsy.  We will determine whether to make a referral to plastics as well after MRI.  I will touch base after MR and hopefully after chemotherapy if oncology agrees with that route.  62 min spent in evaluation, examination, counseling, and coordination of care. >50% spent in counseling. Current Plans MAGNETIC RESONANCE IMAGING WITHOUT CONTRAST FOLLOWED BY WITH CONTRAST, BREAST; BILATERAL ([id number]) (right breast cancer multifocal, dense breasts.) Referred to Oncology, for evaluation and follow up (Oncology). Routine. Referred to Radiation Oncology, for evaluation and follow up (Radiation Oncology). Routine. Follow Up - Call [facility] office after tests / studies doneto discuss further plans FAMILY HISTORY OF BREAST CANCER (Z80.3) Impression: Will refer to genetic counseling. Current Plans Referred to  Genetic Counseling, for evaluation and follow up (Medical Genetics). Routine.   Signed electronically by Cathy Klein, MD (12/25/2019 5:11 PM)

## 2019-12-31 NOTE — Telephone Encounter (Signed)
Spoke to pt, confirmed MR bx and port placement on 11/3. Discussed ERAS and when to drink "shake".  Encourage pt to call with further needs. Received verbal understanding.

## 2020-01-01 ENCOUNTER — Other Ambulatory Visit: Payer: Self-pay | Admitting: Radiology

## 2020-01-01 LAB — SARS CORONAVIRUS 2 (TAT 6-24 HRS): SARS Coronavirus 2: NEGATIVE

## 2020-01-02 ENCOUNTER — Ambulatory Visit
Admission: RE | Admit: 2020-01-02 | Discharge: 2020-01-02 | Disposition: A | Discharge status: Home / Self Care | Payer: BC Managed Care – PPO | Source: Ambulatory Visit | Attending: Hematology and Oncology | Admitting: Hematology and Oncology

## 2020-01-02 ENCOUNTER — Other Ambulatory Visit: Payer: Self-pay

## 2020-01-02 ENCOUNTER — Ambulatory Visit (HOSPITAL_COMMUNITY): Payer: BC Managed Care – PPO

## 2020-01-02 ENCOUNTER — Ambulatory Visit (HOSPITAL_COMMUNITY)
Admission: RE | Admit: 2020-01-02 | Discharge: 2020-01-02 | Disposition: A | Discharge status: Home / Self Care | Payer: BC Managed Care – PPO | Attending: General Surgery | Admitting: General Surgery

## 2020-01-02 ENCOUNTER — Encounter (HOSPITAL_BASED_OUTPATIENT_CLINIC_OR_DEPARTMENT_OTHER): Payer: Self-pay | Admitting: General Surgery

## 2020-01-02 ENCOUNTER — Ambulatory Visit (HOSPITAL_BASED_OUTPATIENT_CLINIC_OR_DEPARTMENT_OTHER): Payer: BC Managed Care – PPO | Admitting: Anesthesiology

## 2020-01-02 ENCOUNTER — Encounter (HOSPITAL_BASED_OUTPATIENT_CLINIC_OR_DEPARTMENT_OTHER)
Admission: RE | Disposition: A | Discharge status: Home / Self Care | Payer: Self-pay | Source: Home / Self Care | Attending: General Surgery

## 2020-01-02 ENCOUNTER — Other Ambulatory Visit: Payer: Self-pay | Admitting: *Deleted

## 2020-01-02 DIAGNOSIS — Z170 Estrogen receptor positive status [ER+]: Secondary | ICD-10-CM | POA: Diagnosis not present

## 2020-01-02 DIAGNOSIS — C50411 Malignant neoplasm of upper-outer quadrant of right female breast: Secondary | ICD-10-CM | POA: Diagnosis not present

## 2020-01-02 DIAGNOSIS — Z95828 Presence of other vascular implants and grafts: Secondary | ICD-10-CM

## 2020-01-02 DIAGNOSIS — N6310 Unspecified lump in the right breast, unspecified quadrant: Secondary | ICD-10-CM | POA: Diagnosis not present

## 2020-01-02 DIAGNOSIS — N6320 Unspecified lump in the left breast, unspecified quadrant: Secondary | ICD-10-CM | POA: Diagnosis not present

## 2020-01-02 DIAGNOSIS — I10 Essential (primary) hypertension: Secondary | ICD-10-CM | POA: Diagnosis not present

## 2020-01-02 DIAGNOSIS — D509 Iron deficiency anemia, unspecified: Secondary | ICD-10-CM | POA: Diagnosis not present

## 2020-01-02 DIAGNOSIS — Z452 Encounter for adjustment and management of vascular access device: Secondary | ICD-10-CM | POA: Diagnosis not present

## 2020-01-02 HISTORY — PX: PORTACATH PLACEMENT: SHX2246

## 2020-01-02 HISTORY — DX: Cerebral infarction, unspecified: I63.9

## 2020-01-02 HISTORY — DX: Type 2 diabetes mellitus without complications: E11.9

## 2020-01-02 SURGERY — INSERTION, TUNNELED CENTRAL VENOUS DEVICE, WITH PORT
Anesthesia: General | Site: Chest

## 2020-01-02 MED ORDER — ONDANSETRON HCL 4 MG/2ML IJ SOLN
INTRAMUSCULAR | Status: DC | PRN
  Administered 2020-01-02: 4 mg via INTRAVENOUS

## 2020-01-02 MED ORDER — PROPOFOL 10 MG/ML IV BOLUS
INTRAVENOUS | Status: AC
  Filled 2020-01-02: qty 20

## 2020-01-02 MED ORDER — GADOBUTROL 1 MMOL/ML IV SOLN
10.0000 mL | Freq: Once | INTRAVENOUS | Status: AC | PRN
  Administered 2020-01-02: 10 mL via INTRAVENOUS

## 2020-01-02 MED ORDER — BUPIVACAINE HCL (PF) 0.25 % IJ SOLN
INTRAMUSCULAR | Status: DC | PRN
  Administered 2020-01-02: 10 mL

## 2020-01-02 MED ORDER — EPHEDRINE 5 MG/ML INJ
INTRAVENOUS | Status: AC
  Filled 2020-01-02: qty 10

## 2020-01-02 MED ORDER — GLYCOPYRROLATE PF 0.2 MG/ML IJ SOSY
PREFILLED_SYRINGE | INTRAMUSCULAR | Status: AC
  Filled 2020-01-02: qty 1

## 2020-01-02 MED ORDER — AMISULPRIDE (ANTIEMETIC) 5 MG/2ML IV SOLN: 10.0000 mg | Freq: Once | INTRAVENOUS | Status: DC | PRN

## 2020-01-02 MED ORDER — DEXTROSE 5 % IV SOLN
3.0000 g | INTRAVENOUS | Status: AC
  Administered 2020-01-02: 3 g via INTRAVENOUS
  Filled 2020-01-02: qty 3000

## 2020-01-02 MED ORDER — FENTANYL CITRATE (PF) 100 MCG/2ML IJ SOLN
INTRAMUSCULAR | Status: AC
  Filled 2020-01-02: qty 2

## 2020-01-02 MED ORDER — ACETAMINOPHEN 500 MG PO TABS
1000.0000 mg | ORAL_TABLET | ORAL | Status: AC
  Administered 2020-01-02: 1000 mg via ORAL

## 2020-01-02 MED ORDER — ARTIFICIAL TEARS OPHTHALMIC OINT
TOPICAL_OINTMENT | OPHTHALMIC | Status: AC
  Filled 2020-01-02: qty 3.5

## 2020-01-02 MED ORDER — EPHEDRINE SULFATE 50 MG/ML IJ SOLN
INTRAMUSCULAR | Status: DC | PRN
  Administered 2020-01-02 (×3): 5 mg via INTRAVENOUS

## 2020-01-02 MED ORDER — HEPARIN SOD (PORK) LOCK FLUSH 100 UNIT/ML IV SOLN
INTRAVENOUS | Status: DC | PRN
  Administered 2020-01-02: 500 [IU]

## 2020-01-02 MED ORDER — LIDOCAINE 2% (20 MG/ML) 5 ML SYRINGE
INTRAMUSCULAR | Status: AC
  Filled 2020-01-02: qty 5

## 2020-01-02 MED ORDER — LACTATED RINGERS IV SOLN: INTRAVENOUS | Status: DC

## 2020-01-02 MED ORDER — MIDAZOLAM HCL 2 MG/2ML IJ SOLN
INTRAMUSCULAR | Status: AC
  Filled 2020-01-02: qty 2

## 2020-01-02 MED ORDER — DEXAMETHASONE SODIUM PHOSPHATE 4 MG/ML IJ SOLN
INTRAMUSCULAR | Status: DC | PRN
  Administered 2020-01-02: 5 mg via INTRAVENOUS

## 2020-01-02 MED ORDER — ONDANSETRON HCL 4 MG/2ML IJ SOLN
INTRAMUSCULAR | Status: AC
  Filled 2020-01-02: qty 2

## 2020-01-02 MED ORDER — CHLORHEXIDINE GLUCONATE CLOTH 2 % EX PADS: 6.0000 | MEDICATED_PAD | Freq: Once | CUTANEOUS | Status: DC

## 2020-01-02 MED ORDER — OXYCODONE HCL 5 MG PO TABS: 5.0000 mg | ORAL_TABLET | Freq: Four times a day (QID) | ORAL | 0 refills | Status: DC | PRN

## 2020-01-02 MED ORDER — PHENYLEPHRINE 40 MCG/ML (10ML) SYRINGE FOR IV PUSH (FOR BLOOD PRESSURE SUPPORT)
PREFILLED_SYRINGE | INTRAVENOUS | Status: AC
  Filled 2020-01-02: qty 10

## 2020-01-02 MED ORDER — CEFAZOLIN SODIUM-DEXTROSE 1-4 GM/50ML-% IV SOLN
INTRAVENOUS | Status: AC
  Filled 2020-01-02: qty 50

## 2020-01-02 MED ORDER — FENTANYL CITRATE (PF) 100 MCG/2ML IJ SOLN
INTRAMUSCULAR | Status: DC | PRN
  Administered 2020-01-02 (×3): 50 ug via INTRAVENOUS

## 2020-01-02 MED ORDER — PROPOFOL 10 MG/ML IV BOLUS
INTRAVENOUS | Status: DC | PRN
  Administered 2020-01-02 (×2): 100 mg via INTRAVENOUS
  Administered 2020-01-02: 200 mg via INTRAVENOUS

## 2020-01-02 MED ORDER — LIDOCAINE-EPINEPHRINE (PF) 1 %-1:200000 IJ SOLN
INTRAMUSCULAR | Status: DC | PRN
  Administered 2020-01-02: 10 mL

## 2020-01-02 MED ORDER — CEFAZOLIN SODIUM-DEXTROSE 2-4 GM/100ML-% IV SOLN
INTRAVENOUS | Status: AC
  Filled 2020-01-02: qty 100

## 2020-01-02 MED ORDER — PHENYLEPHRINE 40 MCG/ML (10ML) SYRINGE FOR IV PUSH (FOR BLOOD PRESSURE SUPPORT)
PREFILLED_SYRINGE | INTRAVENOUS | Status: DC | PRN
  Administered 2020-01-02 (×2): 120 ug via INTRAVENOUS
  Administered 2020-01-02 (×2): 80 ug via INTRAVENOUS

## 2020-01-02 MED ORDER — MIDAZOLAM HCL 5 MG/5ML IJ SOLN
INTRAMUSCULAR | Status: DC | PRN
  Administered 2020-01-02: 2 mg via INTRAVENOUS

## 2020-01-02 MED ORDER — OXYCODONE HCL 5 MG PO TABS: 5.0000 mg | ORAL_TABLET | Freq: Once | ORAL | Status: DC | PRN

## 2020-01-02 MED ORDER — GLYCOPYRROLATE PF 0.2 MG/ML IJ SOSY
PREFILLED_SYRINGE | INTRAMUSCULAR | Status: DC | PRN
  Administered 2020-01-02: .2 mg via INTRAVENOUS

## 2020-01-02 MED ORDER — HEPARIN (PORCINE) IN NACL 2-0.9 UNITS/ML
INTRAMUSCULAR | Status: AC | PRN
  Administered 2020-01-02: 500 mL

## 2020-01-02 MED ORDER — DEXAMETHASONE SODIUM PHOSPHATE 10 MG/ML IJ SOLN
INTRAMUSCULAR | Status: AC
  Filled 2020-01-02: qty 1

## 2020-01-02 MED ORDER — OXYCODONE HCL 5 MG/5ML PO SOLN: 5.0000 mg | Freq: Once | ORAL | Status: DC | PRN

## 2020-01-02 MED ORDER — FENTANYL CITRATE (PF) 100 MCG/2ML IJ SOLN: 25.0000 ug | INTRAMUSCULAR | Status: DC | PRN

## 2020-01-02 MED ORDER — LIDOCAINE 2% (20 MG/ML) 5 ML SYRINGE
INTRAMUSCULAR | Status: DC | PRN
  Administered 2020-01-02: 60 mg via INTRAVENOUS

## 2020-01-02 MED ORDER — ACETAMINOPHEN 500 MG PO TABS
ORAL_TABLET | ORAL | Status: AC
  Filled 2020-01-02: qty 2

## 2020-01-02 SURGICAL SUPPLY — 42 items
ADH SKN CLS APL DERMABOND .7 (GAUZE/BANDAGES/DRESSINGS) ×1
APL PRP STRL LF DISP 70% ISPRP (MISCELLANEOUS) ×1
BAG DECANTER FOR FLEXI CONT (MISCELLANEOUS) ×2 IMPLANT
BLADE HEX COATED 2.75 (ELECTRODE) ×2 IMPLANT
BLADE SURG 11 STRL SS (BLADE) ×2 IMPLANT
BLADE SURG 15 STRL LF DISP TIS (BLADE) ×1 IMPLANT
BLADE SURG 15 STRL SS (BLADE) ×2
CHLORAPREP W/TINT 26 (MISCELLANEOUS) ×2 IMPLANT
COVER BACK TABLE 60X90IN (DRAPES) ×2 IMPLANT
COVER MAYO STAND STRL (DRAPES) ×2 IMPLANT
COVER WAND RF STERILE (DRAPES) IMPLANT
DECANTER SPIKE VIAL GLASS SM (MISCELLANEOUS) IMPLANT
DERMABOND ADVANCED (GAUZE/BANDAGES/DRESSINGS) ×1
DERMABOND ADVANCED .7 DNX12 (GAUZE/BANDAGES/DRESSINGS) ×1 IMPLANT
DRAPE C-ARM 42X72 X-RAY (DRAPES) ×2 IMPLANT
DRAPE LAPAROTOMY TRNSV 102X78 (DRAPES) ×2 IMPLANT
DRAPE UTILITY XL STRL (DRAPES) ×2 IMPLANT
DRSG TEGADERM 4X4.75 (GAUZE/BANDAGES/DRESSINGS) IMPLANT
ELECT REM PT RETURN 9FT ADLT (ELECTROSURGICAL) ×2
ELECTRODE REM PT RTRN 9FT ADLT (ELECTROSURGICAL) ×1 IMPLANT
GAUZE SPONGE 4X4 12PLY STRL LF (GAUZE/BANDAGES/DRESSINGS) IMPLANT
GLOVE BIO SURGEON STRL SZ 6 (GLOVE) ×2 IMPLANT
GLOVE BIOGEL PI IND STRL 6.5 (GLOVE) ×1 IMPLANT
GLOVE BIOGEL PI INDICATOR 6.5 (GLOVE) ×1
GOWN STRL REUS W/ TWL LRG LVL3 (GOWN DISPOSABLE) ×1 IMPLANT
GOWN STRL REUS W/TWL 2XL LVL3 (GOWN DISPOSABLE) ×2 IMPLANT
GOWN STRL REUS W/TWL LRG LVL3 (GOWN DISPOSABLE) ×2
IV CONNECTOR ONE LINK NDLESS (IV SETS) IMPLANT
KIT PORT POWER 8FR ISP CVUE (Port) ×2 IMPLANT
NEEDLE HYPO 25X1 1.5 SAFETY (NEEDLE) ×2 IMPLANT
PACK BASIN DAY SURGERY FS (CUSTOM PROCEDURE TRAY) ×2 IMPLANT
PENCIL SMOKE EVACUATOR (MISCELLANEOUS) ×2 IMPLANT
SLEEVE SCD COMPRESS KNEE MED (MISCELLANEOUS) ×2 IMPLANT
SUT MNCRL AB 4-0 PS2 18 (SUTURE) ×2 IMPLANT
SUT PROLENE 2 0 SH DA (SUTURE) ×4 IMPLANT
SUT VIC AB 3-0 SH 27 (SUTURE) ×2
SUT VIC AB 3-0 SH 27X BRD (SUTURE) ×1 IMPLANT
SUT VICRYL 3-0 CR8 SH (SUTURE) IMPLANT
SYR 10ML LL (SYRINGE) ×2 IMPLANT
SYR 5ML LUER SLIP (SYRINGE) ×2 IMPLANT
SYR CONTROL 10ML LL (SYRINGE) ×2 IMPLANT
TOWEL GREEN STERILE FF (TOWEL DISPOSABLE) ×2 IMPLANT

## 2020-01-02 NOTE — Op Note (Signed)
PREOPERATIVE DIAGNOSIS:  Right breast cancer     POSTOPERATIVE DIAGNOSIS:  Same     PROCEDURE:  Left subclavian port placement, Bard ClearVue  Power Port, MRI safe, 8-French.      SURGEON:  Stark Klein, MD      ANESTHESIA:  General   FINDINGS:  Good venous return, easy flush, and tip of the catheter and   SVC 24 cm.      SPECIMEN:  None.      ESTIMATED BLOOD LOSS:  Minimal.      COMPLICATIONS:  None known.      PROCEDURE:  Pt was identified in the holding area and taken to   the operating room, where patient was placed supine on the operating room   table.  General anesthesia was induced.  Patient's arms were tucked and the upper   chest and neck were prepped and draped in sterile fashion.  Time-out was   performed according to the surgical safety check list.  When all was   correct, we continued.   Local anesthetic was administered over this   area at the angle of the clavicle.  The vein was accessed with 2 pass(es) of the needle. There was good venous return and the wire passed easily with no ectopy.   Fluoroscopy was used to confirm that the wire was in the vena cava.      The patient was placed back level and the area for the pocket was anethetized   with local anesthetic.  A 3-cm transverse incision was made with a #15   blade.  Cautery was used to divide the subcutaneous tissues down to the   pectoralis muscle.  An [employer] retractor was used to elevate the skin   while a pocket was created on top of the pectoralis fascia.  The port   was placed into the pocket to confirm that it was of adequate size.  The   catheter was preattached to the port.  The port was then secured to the   pectoralis fascia with four 2-0 Prolene sutures.  These were clamped and   not tied down yet.    The catheter was tunneled through to the wire exit   site.  The catheter was placed along the wire to determine what length it should be to be in the SVC.  The catheter was cut at 24 cm.  The  tunneler sheath and dilator were passed over the wire and the dilator and wire were removed.  The catheter was advanced through the tunneler sheath and the tunneler sheath was pulled away.  Care was taken to keep the catheter in the tunneler sheath as this occurred. This was advanced and the tunneler sheath was removed.  There was good venous   return and easy flush of the catheter.  The Prolene sutures were tied   down to the pectoral fascia.  The skin was reapproximated using 3-0   Vicryl interrupted deep dermal sutures.    Fluoroscopy was used to re-confirm good position of the catheter.  The skin   was then closed using 4-0 Monocryl in a subcuticular fashion.  The port was flushed with concentrated heparin flush as well.  The wounds were then cleaned, dried, and dressed with Dermabond.  The patient was awakened from anesthesia and taken to the PACU in stable condition.  Needle, sponge, and instrument counts were correct.               Stark Klein, MD

## 2020-01-02 NOTE — Interval H&P Note (Signed)
History and Physical Interval Note:  01/02/2020 1:10 PM  Cathy Parrish  has presented today for surgery, with the diagnosis of BREAST CANCER.  The various methods of treatment have been discussed with the patient and family. After consideration of risks, benefits and other options for treatment, the patient has consented to  Procedure(s): INSERTION PORT-A-CATH WITH ULTRASOUND GUIDANCE (N/A) as a surgical intervention.  The patient's history has been reviewed, patient examined, no change in status, stable for surgery.  I have reviewed the patient's chart and labs.  Questions were answered to the patient's satisfaction.     Stark Klein

## 2020-01-02 NOTE — Discharge Instructions (Addendum)
[place] Office Phone Number [phone]   POST OP INSTRUCTIONS  Always review your discharge instruction sheet given to you by the facility where your surgery was performed.  IF YOU HAVE DISABILITY OR FAMILY LEAVE FORMS, YOU MUST BRING THEM TO THE OFFICE FOR PROCESSING.  DO NOT GIVE THEM TO YOUR DOCTOR.  1. A prescription for pain medication may be given to you upon discharge.  Take your pain medication as prescribed, if needed.  If narcotic pain medicine is not needed, then you may take acetaminophen (Tylenol) or ibuprofen (Advil) as needed. No Tylenol until 7:00pm if needed. 2. Take your usually prescribed medications unless otherwise directed 3. If you need a refill on your pain medication, please contact your pharmacy.  They will contact our office to request authorization.  Prescriptions will not be filled after 5pm or on week-ends. 4. You should eat very light the first 24 hours after surgery, such as soup, crackers, pudding, etc.  Resume your normal diet the day after surgery 5. It is common to experience some constipation if taking pain medication after surgery.  Increasing fluid intake and taking a stool softener will usually help or prevent this problem from occurring.  A mild laxative (Milk of Magnesia or Miralax) should be taken according to package directions if there are no bowel movements after 48 hours. 6. You may shower in 48 hours.  The surgical glue will flake off in 2-3 weeks.   7. ACTIVITIES:  No strenuous activity or heavy lifting for 1 week.   a. You may drive when you no longer are taking prescription pain medication, you can comfortably wear a seatbelt, and you can safely maneuver your car and apply brakes. b. RETURN TO WORK:  __________1 week or as tolerated if no lifting_______________ You should see your doctor in the office for a follow-up appointment approximately three-four weeks after your surgery.    WHEN TO CALL YOUR DOCTOR: 1. Fever over  101.0 2. Nausea and/or vomiting. 3. Extreme swelling or bruising. 4. Continued bleeding from incision. 5. Increased pain, redness, or drainage from the incision.  The clinic staff is available to answer your questions during regular business hours.  Please don't hesitate to call and ask to speak to one of the nurses for clinical concerns.  If you have a medical emergency, go to the nearest emergency room or call 911.  A surgeon from [facility] is always on call at the hospital.  For further questions, please visit [email or web address]    Post Anesthesia Home Care Instructions  Activity: Get plenty of rest for the remainder of the day. A responsible individual must stay with you for 24 hours following the procedure.  For the next 24 hours, DO NOT: -Drive a car -Paediatric nurse -Drink alcoholic beverages -Take any medication unless instructed by your physician -Make any legal decisions or sign important papers.  Meals: Start with liquid foods such as gelatin or soup. Progress to regular foods as tolerated. Avoid greasy, spicy, heavy foods. If nausea and/or vomiting occur, drink only clear liquids until the nausea and/or vomiting subsides. Call your physician if vomiting continues.  Special Instructions/Symptoms: Your throat may feel dry or sore from the anesthesia or the breathing tube placed in your throat during surgery. If this causes discomfort, gargle with warm salt water. The discomfort should disappear within 24 hours.  If you had a scopolamine patch placed behind your ear for the management of post- operative nausea and/or vomiting:  1. The medication  in the patch is effective for 72 hours, after which it should be removed.  Wrap patch in a tissue and discard in the trash. Wash hands thoroughly with soap and water. 2. You may remove the patch earlier than 72 hours if you experience unpleasant side effects which may include dry mouth, dizziness or visual  disturbances. 3. Avoid touching the patch. Wash your hands with soap and water after contact with the patch.

## 2020-01-02 NOTE — Anesthesia Preprocedure Evaluation (Addendum)
Anesthesia Evaluation  Patient identified by MRN, date of birth, ID band Patient awake    Reviewed: Allergy & Precautions, NPO status , Patient's Chart, lab work & pertinent test results  Airway Mallampati: II  TM Distance: >3 FB Neck ROM: Full    Dental no notable dental hx. (+) Teeth Intact, Dental Advisory Given   Pulmonary asthma ,    Pulmonary exam normal breath sounds clear to auscultation       Cardiovascular hypertension, negative cardio ROS Normal cardiovascular exam Rhythm:Regular Rate:Normal     Neuro/Psych PSYCHIATRIC DISORDERS Depression CVA, No Residual Symptoms    GI/Hepatic negative GI ROS, Neg liver ROS,   Endo/Other  diabetes, Well Controlled, Type 2Morbid obesity (BMI 45)  Renal/GU negative Renal ROS  negative genitourinary   Musculoskeletal negative musculoskeletal ROS (+)   Abdominal   Peds  Hematology negative hematology ROS (+)   Anesthesia Other Findings   Reproductive/Obstetrics                            Anesthesia Physical Anesthesia Plan  ASA: III  Anesthesia Plan: General   Post-op Pain Management:    Induction: Intravenous  PONV Risk Score and Plan: 3 and Ondansetron, Dexamethasone and Midazolam  Airway Management Planned: LMA  Additional Equipment:   Intra-op Plan:   Post-operative Plan: Extubation in OR  Informed Consent: I have reviewed the patients History and Physical, chart, labs and discussed the procedure including the risks, benefits and alternatives for the proposed anesthesia with the patient or authorized representative who has indicated his/her understanding and acceptance.     Dental advisory given  Plan Discussed with: CRNA  Anesthesia Plan Comments:         Anesthesia Quick Evaluation

## 2020-01-02 NOTE — Transfer of Care (Signed)
Immediate Anesthesia Transfer of Care Note  Patient: Cathy Parrish  Procedure(s) Performed: INSERTION PORT-A-CATH (N/A Chest)  Patient Location: PACU  Anesthesia Type:General  Level of Consciousness: awake, alert  and oriented  Airway & Oxygen Therapy: Patient Spontanous Breathing and Patient connected to face mask oxygen  Post-op Assessment: Report given to RN and Post -op Vital signs reviewed and stable  Post vital signs: Reviewed and stable  Last Vitals:  Vitals Value Taken Time  BP 134/74 01/02/20 1540  Temp    Pulse 113 01/02/20 1543  Resp 18 01/02/20 1543  SpO2 99 % 01/02/20 1543  Vitals shown include unvalidated device data.  Last Pain:  Vitals:   01/02/20 1227  TempSrc: Oral  PainSc: 0-No pain      Patients Stated Pain Goal: 2 (76/70/11 0034)  Complications: No complications documented.

## 2020-01-02 NOTE — Anesthesia Procedure Notes (Signed)
Procedure Name: LMA Insertion Date/Time: 01/02/2020 2:45 PM Performed by: Lavonia Dana, CRNA Pre-anesthesia Checklist: Patient identified, Emergency Drugs available, Suction available and Patient being monitored Patient Re-evaluated:Patient Re-evaluated prior to induction Oxygen Delivery Method: Circle system utilized Preoxygenation: Pre-oxygenation with 100% oxygen Induction Type: IV induction Ventilation: Mask ventilation without difficulty LMA: LMA inserted LMA Size: 4.0 Number of attempts: 1 Airway Equipment and Method: Bite block Placement Confirmation: positive ETCO2 Tube secured with: Tape Dental Injury: Teeth and Oropharynx as per pre-operative assessment

## 2020-01-03 ENCOUNTER — Inpatient Hospital Stay: Payer: BC Managed Care – PPO

## 2020-01-03 ENCOUNTER — Inpatient Hospital Stay: Payer: BC Managed Care – PPO | Admitting: Licensed Clinical Social Worker

## 2020-01-03 ENCOUNTER — Encounter (HOSPITAL_BASED_OUTPATIENT_CLINIC_OR_DEPARTMENT_OTHER): Payer: Self-pay | Admitting: General Surgery

## 2020-01-03 ENCOUNTER — Encounter: Payer: Self-pay | Admitting: *Deleted

## 2020-01-03 ENCOUNTER — Inpatient Hospital Stay: Payer: BC Managed Care – PPO | Attending: Hematology and Oncology

## 2020-01-03 ENCOUNTER — Other Ambulatory Visit: Payer: Self-pay

## 2020-01-03 VITALS — BP 131/74 | HR 91 | Temp 98.6°F | Resp 18 | Ht 66.0 in | Wt 277.0 lb

## 2020-01-03 DIAGNOSIS — E119 Type 2 diabetes mellitus without complications: Secondary | ICD-10-CM | POA: Insufficient documentation

## 2020-01-03 DIAGNOSIS — Z5189 Encounter for other specified aftercare: Secondary | ICD-10-CM | POA: Diagnosis not present

## 2020-01-03 DIAGNOSIS — C50411 Malignant neoplasm of upper-outer quadrant of right female breast: Secondary | ICD-10-CM | POA: Insufficient documentation

## 2020-01-03 DIAGNOSIS — Z5111 Encounter for antineoplastic chemotherapy: Secondary | ICD-10-CM | POA: Insufficient documentation

## 2020-01-03 DIAGNOSIS — Z170 Estrogen receptor positive status [ER+]: Secondary | ICD-10-CM

## 2020-01-03 DIAGNOSIS — J45909 Unspecified asthma, uncomplicated: Secondary | ICD-10-CM | POA: Diagnosis not present

## 2020-01-03 DIAGNOSIS — Z8673 Personal history of transient ischemic attack (TIA), and cerebral infarction without residual deficits: Secondary | ICD-10-CM | POA: Diagnosis not present

## 2020-01-03 DIAGNOSIS — I10 Essential (primary) hypertension: Secondary | ICD-10-CM | POA: Insufficient documentation

## 2020-01-03 DIAGNOSIS — Z95828 Presence of other vascular implants and grafts: Secondary | ICD-10-CM

## 2020-01-03 LAB — CMP (CANCER CENTER ONLY)
ALT: 15 U/L (ref 0–44)
AST: 16 U/L (ref 15–41)
Albumin: 3.8 g/dL (ref 3.5–5.0)
Alkaline Phosphatase: 104 U/L (ref 38–126)
Anion gap: 9 (ref 5–15)
BUN: 16 mg/dL (ref 6–20)
CO2: 25 mmol/L (ref 22–32)
Calcium: 9 mg/dL (ref 8.9–10.3)
Chloride: 104 mmol/L (ref 98–111)
Creatinine: 0.72 mg/dL (ref 0.44–1.00)
GFR, Estimated: >60 mL/min (ref >60)
Glucose, Bld: 150 mg/dL — ABNORMAL HIGH (ref 70–99)
Potassium: 4 mmol/L (ref 3.5–5.1)
Sodium: 138 mmol/L (ref 135–145)
Total Bilirubin: 0.4 mg/dL (ref 0.3–1.2)
Total Protein: 7.4 g/dL (ref 6.5–8.1)

## 2020-01-03 LAB — CBC WITH DIFFERENTIAL (CANCER CENTER ONLY)
Abs Immature Granulocytes: 0.04 10*3/uL (ref 0.00–0.07)
Basophils Absolute: 0 10*3/uL (ref 0.0–0.1)
Basophils Relative: 0 %
Eosinophils Absolute: 0 10*3/uL (ref 0.0–0.5)
Eosinophils Relative: 0 %
HCT: 37.3 % (ref 36.0–46.0)
Hemoglobin: 12.6 g/dL (ref 12.0–15.0)
Immature Granulocytes: 0 %
Lymphocytes Relative: 17 %
Lymphs Abs: 1.8 10*3/uL (ref 0.7–4.0)
MCH: 29.2 pg (ref 26.0–34.0)
MCHC: 33.8 g/dL (ref 30.0–36.0)
MCV: 86.5 fL (ref 80.0–100.0)
Monocytes Absolute: 1 10*3/uL (ref 0.1–1.0)
Monocytes Relative: 10 %
Neutro Abs: 7.5 10*3/uL (ref 1.7–7.7)
Neutrophils Relative %: 73 %
Platelet Count: 327 10*3/uL (ref 150–400)
RBC: 4.31 MIL/uL (ref 3.87–5.11)
RDW: 14.4 % (ref 11.5–15.5)
WBC Count: 10.4 10*3/uL (ref 4.0–10.5)
nRBC: 0 % (ref 0.0–0.2)

## 2020-01-03 MED ORDER — SODIUM CHLORIDE 0.9 % IV SOLN
600.0000 mg/m2 | Freq: Once | INTRAVENOUS | Status: AC
  Administered 2020-01-03: 1460 mg via INTRAVENOUS
  Filled 2020-01-03: qty 73

## 2020-01-03 MED ORDER — SODIUM CHLORIDE 0.9 % IV SOLN
Freq: Once | INTRAVENOUS | Status: AC
  Filled 2020-01-03: qty 250

## 2020-01-03 MED ORDER — PALONOSETRON HCL INJECTION 0.25 MG/5ML
0.2500 mg | Freq: Once | INTRAVENOUS | Status: AC
  Administered 2020-01-03: 0.25 mg via INTRAVENOUS

## 2020-01-03 MED ORDER — SODIUM CHLORIDE 0.9% FLUSH
10.0000 mL | INTRAVENOUS | Status: DC | PRN
  Filled 2020-01-03: qty 10

## 2020-01-03 MED ORDER — SODIUM CHLORIDE 0.9% FLUSH
10.0000 mL | Freq: Once | INTRAVENOUS | Status: AC
  Administered 2020-01-03: 10 mL
  Filled 2020-01-03: qty 10

## 2020-01-03 MED ORDER — SODIUM CHLORIDE 0.9 % IV SOLN
10.0000 mg | Freq: Once | INTRAVENOUS | Status: AC
  Administered 2020-01-03: 10 mg via INTRAVENOUS
  Filled 2020-01-03: qty 10
  Filled 2020-01-03: qty 1

## 2020-01-03 MED ORDER — DOXORUBICIN HCL CHEMO IV INJECTION 2 MG/ML
60.0000 mg/m2 | Freq: Once | INTRAVENOUS | Status: AC
  Administered 2020-01-03: 146 mg via INTRAVENOUS
  Filled 2020-01-03: qty 73

## 2020-01-03 MED ORDER — PALONOSETRON HCL INJECTION 0.25 MG/5ML
INTRAVENOUS | Status: AC
  Filled 2020-01-03: qty 5

## 2020-01-03 MED ORDER — SODIUM CHLORIDE 0.9 % IV SOLN
150.0000 mg | Freq: Once | INTRAVENOUS | Status: AC
  Administered 2020-01-03: 150 mg via INTRAVENOUS
  Filled 2020-01-03: qty 5
  Filled 2020-01-03: qty 150

## 2020-01-03 MED ORDER — HEPARIN SOD (PORK) LOCK FLUSH 100 UNIT/ML IV SOLN
500.0000 [IU] | Freq: Once | INTRAVENOUS | Status: DC | PRN
  Filled 2020-01-03: qty 5

## 2020-01-03 NOTE — Anesthesia Postprocedure Evaluation (Signed)
Anesthesia Post Note  Patient: Cathy Parrish  Procedure(s) Performed: INSERTION PORT-A-CATH (N/A Chest)     Patient location during evaluation: PACU Anesthesia Type: General Level of consciousness: awake and alert Pain management: pain level controlled Vital Signs Assessment: post-procedure vital signs reviewed and stable Respiratory status: spontaneous breathing, nonlabored ventilation, respiratory function stable and patient connected to nasal cannula oxygen Cardiovascular status: blood pressure returned to baseline and stable Postop Assessment: no apparent nausea or vomiting Anesthetic complications: no   No complications documented.  Last Vitals:  Vitals:   01/02/20 1600 01/02/20 1630  BP: 138/84 (!) 143/82  Pulse: (!) 102 93  Resp: 18 18  Temp:  36.4 C  SpO2: 96% 100%    Last Pain:  Vitals:   01/02/20 1630  TempSrc:   PainSc: 0-No pain                 Seiya Silsby DAVID

## 2020-01-03 NOTE — Progress Notes (Signed)
[place] CSW Progress Note  Holiday representative met with patient in infusion to review resources.  Provided breast cancer foundation information and applications. Patient will work on them at home and notify this CSW about any questions and when ready to submit.  Provided monthly calendars for [facility] programs and [employer]. Referred to pt financial advocate for [employer].  Patient currently using FMLA and short-term disability through work. Declined referral to [employer] for social security disability at this time.  Patient already connected with peer mentor. She spoke with her this morning which was helpful. Patient is feeling calm today although she was anxious leading up to first treatment today.   Follow up: CSW will check-in periodically throughout treatment   [name] LCSW

## 2020-01-03 NOTE — Patient Instructions (Signed)
[facility] Discharge Instructions for Patients Receiving Chemotherapy  Today you received the following chemotherapy agents  Doxorubicin (Adriamycin) and Cyclophosphamide (Cytoxan)  To help prevent nausea and vomiting after your treatment, we encourage you to take your nausea medication as prescribed   If you develop nausea and vomiting that is not controlled by your nausea medication, call the clinic.   BELOW ARE SYMPTOMS THAT SHOULD BE REPORTED IMMEDIATELY:  *FEVER GREATER THAN 100.5 F  *CHILLS WITH OR WITHOUT FEVER  NAUSEA AND VOMITING THAT IS NOT CONTROLLED WITH YOUR NAUSEA MEDICATION  *UNUSUAL SHORTNESS OF BREATH  *UNUSUAL BRUISING OR BLEEDING  TENDERNESS IN MOUTH AND THROAT WITH OR WITHOUT PRESENCE OF ULCERS  *URINARY PROBLEMS  *BOWEL PROBLEMS  UNUSUAL RASH Items with * indicate a potential emergency and should be followed up as soon as possible.  Feel free to call the clinic should you have any questions or concerns. The clinic phone number is [phone].  Please show the [place] at check-in to the Emergency Department and triage nurse.  Doxorubicin injection What is this medicine? DOXORUBICIN (dox oh ROO bi sin) is a chemotherapy drug. It is used to treat many kinds of cancer like leukemia, lymphoma, neuroblastoma, sarcoma, and Wilms' tumor. It is also used to treat bladder cancer, breast cancer, lung cancer, ovarian cancer, stomach cancer, and thyroid cancer. This medicine may be used for other purposes; ask your health care provider or pharmacist if you have questions. COMMON BRAND NAME(S): Adriamycin, Adriamycin PFS, Adriamycin RDF, Rubex What should I tell my health care provider before I take this medicine? They need to know if you have any of these conditions:  heart disease  history of low blood counts caused by a medicine  liver disease  recent or ongoing radiation therapy  an unusual or allergic reaction to doxorubicin,  other chemotherapy agents, other medicines, foods, dyes, or preservatives  pregnant or trying to get pregnant  breast-feeding How should I use this medicine? This drug is given as an infusion into a vein. It is administered in a hospital or clinic by a specially trained health care professional. If you have pain, swelling, burning or any unusual feeling around the site of your injection, tell your health care professional right away. Talk to your pediatrician regarding the use of this medicine in children. Special care may be needed. Overdosage: If you think you have taken too much of this medicine contact a poison control center or emergency room at once. NOTE: This medicine is only for you. Do not share this medicine with others. What if I miss a dose? It is important not to miss your dose. Call your doctor or health care professional if you are unable to keep an appointment. What may interact with this medicine? This medicine may interact with the following medications:  6-mercaptopurine  paclitaxel  phenytoin  St. John's Wort  trastuzumab  verapamil This list may not describe all possible interactions. Give your health care provider a list of all the medicines, herbs, non-prescription drugs, or dietary supplements you use. Also tell them if you smoke, drink alcohol, or use illegal drugs. Some items may interact with your medicine. What should I watch for while using this medicine? This drug may make you feel generally unwell. This is not uncommon, as chemotherapy can affect healthy cells as well as cancer cells. Report any side effects. Continue your course of treatment even though you feel ill unless your doctor tells you to stop. There is  a maximum amount of this medicine you should receive throughout your life. The amount depends on the medical condition being treated and your overall health. Your doctor will watch how much of this medicine you receive in your lifetime. Tell your  doctor if you have taken this medicine before. You may need blood work done while you are taking this medicine. Your urine may turn red for a few days after your dose. This is not blood. If your urine is dark or brown, call your doctor. In some cases, you may be given additional medicines to help with side effects. Follow all directions for their use. Call your doctor or health care professional for advice if you get a fever, chills or sore throat, or other symptoms of a cold or flu. Do not treat yourself. This drug decreases your body's ability to fight infections. Try to avoid being around people who are sick. This medicine may increase your risk to bruise or bleed. Call your doctor or health care professional if you notice any unusual bleeding. Talk to your doctor about your risk of cancer. You may be more at risk for certain types of cancers if you take this medicine. Do not become pregnant while taking this medicine or for 6 months after stopping it. Women should inform their doctor if they wish to become pregnant or think they might be pregnant. Men should not father a child while taking this medicine and for 6 months after stopping it. There is a potential for serious side effects to an unborn child. Talk to your health care professional or pharmacist for more information. Do not breast-feed an infant while taking this medicine. This medicine has caused ovarian failure in some women and reduced sperm counts in some men This medicine may interfere with the ability to have a child. Talk with your doctor or health care professional if you are concerned about your fertility. This medicine may cause a decrease in Co-Enzyme Q-10. You should make sure that you get enough Co-Enzyme Q-10 while you are taking this medicine. Discuss the foods you eat and the vitamins you take with your health care professional. What side effects may I notice from receiving this medicine? Side effects that you should report to  your doctor or health care professional as soon as possible:  allergic reactions like skin rash, itching or hives, swelling of the face, lips, or tongue  breathing problems  chest pain  fast or irregular heartbeat  low blood counts - this medicine may decrease the number of white blood cells, red blood cells and platelets. You may be at increased risk for infections and bleeding.  pain, redness, or irritation at site where injected  signs of infection - fever or chills, cough, sore throat, pain or difficulty passing urine  signs of decreased platelets or bleeding - bruising, pinpoint red spots on the skin, black, tarry stools, blood in the urine  swelling of the ankles, feet, hands  tiredness  weakness Side effects that usually do not require medical attention (report to your doctor or health care professional if they continue or are bothersome):  diarrhea  hair loss  mouth sores  nail discoloration or damage  nausea  red colored urine  vomiting This list may not describe all possible side effects. Call your doctor for medical advice about side effects. You may report side effects to FDA at 1-800-FDA-1088. Where should I keep my medicine? This drug is given in a hospital or clinic and will not be  stored at home. NOTE: This sheet is a summary. It may not cover all possible information. If you have questions about this medicine, talk to your doctor, pharmacist, or health care provider.  2020 Elsevier/Gold Standard (2016-09-29 11:01:26)  Cyclophosphamide (Cytoxan) Injection What is this medicine? CYCLOPHOSPHAMIDE (sye kloe FOSS fa mide) is a chemotherapy drug. It slows the growth of cancer cells. This medicine is used to treat many types of cancer like lymphoma, myeloma, leukemia, breast cancer, and ovarian cancer, to name a few. This medicine may be used for other purposes; ask your health care provider or pharmacist if you have questions. COMMON BRAND NAME(S): Cytoxan,  Neosar What should I tell my health care provider before I take this medicine? They need to know if you have any of these conditions:  heart disease  history of irregular heartbeat  infection  kidney disease  liver disease  low blood counts, like white cells, platelets, or red blood cells  on hemodialysis  recent or ongoing radiation therapy  scarring or thickening of the lungs  trouble passing urine  an unusual or allergic reaction to cyclophosphamide, other medicines, foods, dyes, or preservatives  pregnant or trying to get pregnant  breast-feeding How should I use this medicine? This drug is usually given as an injection into a vein or muscle or by infusion into a vein. It is administered in a hospital or clinic by a specially trained health care professional. Talk to your pediatrician regarding the use of this medicine in children. Special care may be needed. Overdosage: If you think you have taken too much of this medicine contact a poison control center or emergency room at once. NOTE: This medicine is only for you. Do not share this medicine with others. What if I miss a dose? It is important not to miss your dose. Call your doctor or health care professional if you are unable to keep an appointment. What may interact with this medicine?  amphotericin B  azathioprine  certain antivirals for HIV or hepatitis  certain medicines for blood pressure, heart disease, irregular heart beat  certain medicines that treat or prevent blood clots like warfarin  certain other medicines for cancer  cyclosporine  etanercept  indomethacin  medicines that relax muscles for surgery  medicines to increase blood counts  metronidazole This list may not describe all possible interactions. Give your health care provider a list of all the medicines, herbs, non-prescription drugs, or dietary supplements you use. Also tell them if you smoke, drink alcohol, or use illegal  drugs. Some items may interact with your medicine. What should I watch for while using this medicine? Your condition will be monitored carefully while you are receiving this medicine. You may need blood work done while you are taking this medicine. Drink water or other fluids as directed. Urinate often, even at night. Some products may contain alcohol. Ask your health care professional if this medicine contains alcohol. Be sure to tell all health care professionals you are taking this medicine. Certain medicines, like metronidazole and disulfiram, can cause an unpleasant reaction when taken with alcohol. The reaction includes flushing, headache, nausea, vomiting, sweating, and increased thirst. The reaction can last from 30 minutes to several hours. Do not become pregnant while taking this medicine or for 1 year after stopping it. Women should inform their health care professional if they wish to become pregnant or think they might be pregnant. Men should not father a child while taking this medicine and for 4 months after  stopping it. There is potential for serious side effects to an unborn child. Talk to your health care professional for more information. Do not breast-feed an infant while taking this medicine or for 1 week after stopping it. This medicine has caused ovarian failure in some women. This medicine may make it more difficult to get pregnant. Talk to your health care professional if you are concerned about your fertility. This medicine has caused decreased sperm counts in some men. This may make it more difficult to father a child. Talk to your health care professional if you are concerned about your fertility. Call your health care professional for advice if you get a fever, chills, or sore throat, or other symptoms of a cold or flu. Do not treat yourself. This medicine decreases your body's ability to fight infections. Try to avoid being around people who are sick. Avoid taking medicines  that contain aspirin, acetaminophen, ibuprofen, naproxen, or ketoprofen unless instructed by your health care professional. These medicines may hide a fever. Talk to your health care professional about your risk of cancer. You may be more at risk for certain types of cancer if you take this medicine. If you are going to need surgery or other procedure, tell your health care professional that you are using this medicine. Be careful brushing or flossing your teeth or using a toothpick because you may get an infection or bleed more easily. If you have any dental work done, tell your dentist you are receiving this medicine. What side effects may I notice from receiving this medicine? Side effects that you should report to your doctor or health care professional as soon as possible:  allergic reactions like skin rash, itching or hives, swelling of the face, lips, or tongue  breathing problems  nausea, vomiting  signs and symptoms of bleeding such as bloody or black, tarry stools; red or dark brown urine; spitting up blood or brown material that looks like coffee grounds; red spots on the skin; unusual bruising or bleeding from the eyes, gums, or nose  signs and symptoms of heart failure like fast, irregular heartbeat, sudden weight gain; swelling of the ankles, feet, hands  signs and symptoms of infection like fever; chills; cough; sore throat; pain or trouble passing urine  signs and symptoms of kidney injury like trouble passing urine or change in the amount of urine  signs and symptoms of liver injury like dark yellow or brown urine; general ill feeling or flu-like symptoms; light-colored stools; loss of appetite; nausea; right upper belly pain; unusually weak or tired; yellowing of the eyes or skin Side effects that usually do not require medical attention (report to your doctor or health care professional if they continue or are bothersome):  confusion  decreased  hearing  diarrhea  facial flushing  hair loss  headache  loss of appetite  missed menstrual periods  signs and symptoms of low red blood cells or anemia such as unusually weak or tired; feeling faint or lightheaded; falls  skin discoloration This list may not describe all possible side effects. Call your doctor for medical advice about side effects. You may report side effects to FDA at 1-800-FDA-1088. Where should I keep my medicine? This drug is given in a hospital or clinic and will not be stored at home. NOTE: This sheet is a summary. It may not cover all possible information. If you have questions about this medicine, talk to your doctor, pharmacist, or health care provider.  2020 Elsevier/Gold Standard (2018-11-20  09:53:29)   

## 2020-01-03 NOTE — Patient Instructions (Signed)

## 2020-01-04 ENCOUNTER — Telehealth: Payer: Self-pay | Admitting: *Deleted

## 2020-01-04 NOTE — Telephone Encounter (Signed)
RN placed call to pt for f/u on chemotherapy from yesterday.  Pt remains asymptomatic and appreciative of the call.  RN encouraged pt to increase fluid intake and to call the office if any issues arise.

## 2020-01-05 ENCOUNTER — Inpatient Hospital Stay: Payer: BC Managed Care – PPO

## 2020-01-05 ENCOUNTER — Other Ambulatory Visit: Payer: Self-pay

## 2020-01-05 VITALS — BP 110/62 | HR 81 | Temp 98.1°F | Resp 18

## 2020-01-05 DIAGNOSIS — C50411 Malignant neoplasm of upper-outer quadrant of right female breast: Secondary | ICD-10-CM

## 2020-01-05 DIAGNOSIS — I10 Essential (primary) hypertension: Secondary | ICD-10-CM | POA: Diagnosis not present

## 2020-01-05 DIAGNOSIS — Z8673 Personal history of transient ischemic attack (TIA), and cerebral infarction without residual deficits: Secondary | ICD-10-CM | POA: Diagnosis not present

## 2020-01-05 DIAGNOSIS — Z170 Estrogen receptor positive status [ER+]: Secondary | ICD-10-CM | POA: Diagnosis not present

## 2020-01-05 DIAGNOSIS — Z5189 Encounter for other specified aftercare: Secondary | ICD-10-CM | POA: Diagnosis not present

## 2020-01-05 DIAGNOSIS — Z5111 Encounter for antineoplastic chemotherapy: Secondary | ICD-10-CM | POA: Diagnosis not present

## 2020-01-05 DIAGNOSIS — J45909 Unspecified asthma, uncomplicated: Secondary | ICD-10-CM | POA: Diagnosis not present

## 2020-01-05 DIAGNOSIS — E119 Type 2 diabetes mellitus without complications: Secondary | ICD-10-CM | POA: Diagnosis not present

## 2020-01-05 MED ORDER — PEGFILGRASTIM-JMDB 6 MG/0.6ML ~~LOC~~ SOSY
6.0000 mg | PREFILLED_SYRINGE | Freq: Once | SUBCUTANEOUS | Status: AC
  Administered 2020-01-05: 6 mg via SUBCUTANEOUS

## 2020-01-05 NOTE — Patient Instructions (Signed)

## 2020-01-05 NOTE — Progress Notes (Signed)
Pt seen in Saturday clinic for pegfilgrastim injection. Complained of mild vaginitis. She plans to take OTC medication and follow up next week if symptoms increase.

## 2020-01-09 ENCOUNTER — Telehealth: Payer: Self-pay | Admitting: Licensed Clinical Social Worker

## 2020-01-09 NOTE — Telephone Encounter (Signed)
Cathy Parrish  CSW received TC from patient stating that she has completed the [facility] [place] application and would like to turn it in tomorrow. CSW will meet with pt during appt with Dr. Lindi Adie.   [name], LCSW

## 2020-01-09 NOTE — Progress Notes (Signed)
Patient Care Team: Forrest Moron, MD as PCP - General (Internal Medicine) Rockwell Germany, RN as Oncology Nurse Navigator Rockwell Germany, RN as Oncology Nurse Navigator  DIAGNOSIS:    ICD-10-CM   1. Malignant neoplasm of upper-outer quadrant of right breast in female, estrogen receptor positive ([place])  C50.411    Z17.0     SUMMARY OF ONCOLOGIC HISTORY: Oncology History  Malignant neoplasm of upper-outer quadrant of right breast in female, estrogen receptor positive ([place])  12/13/2019 Initial Diagnosis   Screening detected right breast masses 11:30 position: 4.3 cm: Biopsy grade 3 IDC ER 90% PR 0%, KI 40%, HER-2 negative 12 o'clock position: 1.9 cm: Biopsy: Grade 3 IDC ER 40%, PR 0%, HER-2 negative, Ki-67 40%, right axillary lymph node biopsied benign but discordant   12/19/2019 Cancer Staging   Staging form: Breast, AJCC 8th Edition - Clinical stage from 12/19/2019: Stage IIIA (cT2, cN1, cM0, G3, ER+, PR-, HER2-) - Signed by Nicholas Lose, MD on 12/19/2019   01/03/2020 -  Chemotherapy   The patient had dexamethasone (DECADRON) 4 MG tablet, 4 mg (100 % of original dose 4 mg), Oral, Daily, 1 of 1 cycle, Start date: 12/19/2019, End date: -- Dose modification: 4 mg (original dose 4 mg, Cycle 0) DOXOrubicin (ADRIAMYCIN) chemo injection 146 mg, 60 mg/m2 = 146 mg, Intravenous,  Once, 1 of 4 cycles Administration: 146 mg (01/03/2020) palonosetron (ALOXI) injection 0.25 mg, 0.25 mg, Intravenous,  Once, 1 of 8 cycles Administration: 0.25 mg (01/03/2020) pegfilgrastim-jmdb (FULPHILA) injection 6 mg, 6 mg, Subcutaneous,  Once, 1 of 4 cycles CARBOplatin (PARAPLATIN) 700 mg in sodium chloride 0.9 % 250 mL chemo infusion, 700 mg (100 % of original dose 700 mg), Intravenous,  Once, 0 of 4 cycles Dose modification: 700 mg (original dose 700 mg, Cycle 5) cyclophosphamide (CYTOXAN) 1,460 mg in sodium chloride 0.9 % 250 mL chemo infusion, 600 mg/m2 = 1,460 mg, Intravenous,  Once, 1 of 4  cycles Administration: 1,460 mg (01/03/2020) PACLitaxel (TAXOL) 192 mg in sodium chloride 0.9 % 250 mL chemo infusion (</= 49m/m2), 80 mg/m2 = 192 mg, Intravenous,  Once, 0 of 4 cycles fosaprepitant (EMEND) 150 mg in sodium chloride 0.9 % 145 mL IVPB, 150 mg, Intravenous,  Once, 1 of 8 cycles Administration: 150 mg (01/03/2020)  for chemotherapy treatment.      CHIEF COMPLIANT: Cycle 1 Day 8 Adriamycin and Cytoxan  INTERVAL HISTORY: TCheyLAstha Probascois a 49y.o. with above-mentioned history of right breast cancer currently on neoadjuvant chemotherapy with dose dense Adriamycin and Cytoxan. She presents to the clinic today for a toxicity check following cycle 1.  She tolerated chemotherapy extremely well.  Did not have any major nausea or vomiting.  She did have mild nausea on day 4 after chemotherapy.  She did not experience any bone pain.  She felt some fatigue.  ALLERGIES:  has No Known Allergies.  MEDICATIONS:  Current Outpatient Medications  Medication Sig Dispense Refill  . albuterol (PROVENTIL HFA;VENTOLIN HFA) 108 (90 BASE) MCG/ACT inhaler Inhale 2 puffs into the lungs every 4 (four) hours as needed for wheezing. 1 Inhaler 0  . ALPRAZolam (XANAX) 0.5 MG tablet TAKE 1 TABLET(0.5 MG) BY MOUTH AT BEDTIME AS NEEDED FOR ANXIETY 30 tablet 0  . Aspirin-Salicylamide-Caffeine (BC HEADACHE POWDER PO) Take 1 packet by mouth daily as needed (for headache).    .Marland Kitchenatorvastatin (LIPITOR) 20 MG tablet Take 1 tablet (20 mg total) by mouth daily. 90 tablet 3  . dexamethasone (DECADRON)  4 MG tablet Take 1 tablet (4 mg total) by mouth daily. Take 1 tablet day after chemo and 1 tablet 2 days after chemo with food 8 tablet 0  . fluticasone (FLONASE) 50 MCG/ACT nasal spray Place 2 sprays into both nostrils 2 (two) times daily. Decrease to 2 sprays/nostril daily after 5 days 16 g 2  . ibuprofen (ADVIL) 800 MG tablet Take 800 mg by mouth 3 (three) times daily.    Marland Kitchen ibuprofen (ADVIL,MOTRIN) 200 MG tablet Take 4  tablets (800 mg total) by mouth every 6 (six) hours as needed for pain. 30 tablet 4  . irbesartan-hydrochlorothiazide (AVALIDE) 150-12.5 MG tablet Take 1 tablet by mouth daily. 90 tablet 2  . lidocaine-prilocaine (EMLA) cream Apply to affected area once 30 g 3  . LORazepam (ATIVAN) 0.5 MG tablet Take 1 tablet (0.5 mg total) by mouth at bedtime as needed for sleep. 30 tablet 0  . ondansetron (ZOFRAN) 8 MG tablet Take 1 tablet (8 mg total) by mouth 2 (two) times daily as needed. Start on the third day after chemotherapy. 30 tablet 1  . oxyCODONE (OXY IR/ROXICODONE) 5 MG immediate release tablet Take 1 tablet (5 mg total) by mouth every 6 (six) hours as needed for severe pain. 15 tablet 0  . prochlorperazine (COMPAZINE) 10 MG tablet Take 1 tablet (10 mg total) by mouth every 6 (six) hours as needed (Nausea or vomiting). 30 tablet 1  . QUEtiapine (SEROQUEL) 25 MG tablet TAKE 1 TO 2 TABLETS(25 TO 50 MG) BY MOUTH AT BEDTIME 90 tablet 3  . SUMAtriptan (IMITREX) 50 MG tablet Take one tablet by mouth at the first sign of headache. May repeat in 2 hours if headache persists or recurs. 10 tablet 0  . Tetrahydrozoline HCl (VISINE OP) Apply 1 drop to eye as needed (for allergies).     No current facility-administered medications for this visit.    PHYSICAL EXAMINATION: ECOG PERFORMANCE STATUS: 1 - Symptomatic but completely ambulatory  There were no vitals filed for this visit. There were no vitals filed for this visit.  LABORATORY DATA:  I have reviewed the data as listed CMP Latest Ref Rng & Units 01/03/2020 12/28/2019 06/20/2019  Glucose 70 - 99 mg/dL 150(H) 107(H) 104(H)  BUN 6 - 20 mg/dL _0 Creatinine 0.44 - 1.00 mg/dL 0.72 0.56 0.70  Sodium 135 - 145 mmol/L 138 137 139  Potassium 3.5 - 5.1 mmol/L 4.0 4.1 3.9  Chloride 98 - 111 mmol/L 104 100 98  CO2 22 - 32 mmol/L _1 Calcium 8.9 - 10.3 mg/dL 9.0 9.6 9.7  Total Protein 6.5 - 8.1 g/dL 7.4 - 7.4  Total Bilirubin 0.3 - 1.2 mg/dL 0.4  - 0.3  Alkaline Phos 38 - 126 U/L 104 - 117  AST 15 - 41 U/L 16 - 18  ALT 0 - 44 U/L 15 - 20    Lab Results  Component Value Date   WBC 2.9 (L) 01/10/2020   HGB 12.9 01/10/2020   HCT 38.8 01/10/2020   MCV 88.2 01/10/2020   PLT 208 01/10/2020   NEUTROABS PENDING 01/10/2020    ASSESSMENT & PLAN:  Malignant neoplasm of upper-outer quadrant of right breast in female, estrogen receptor positive ([place]) 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%, HER-2 negative 12 o'clock position: 1.9 cm: Biopsy: Grade 3 IDC ER 40%, PR 0%, HER-2 negative, Ki-67 40%, right axillary lymph node biopsied  benign but discordant T2N?M0 stage IIa versus stage IIIa (based on the lymph node)  Treatment plan: 1.  Neoadjuvant chemotherapy with dose dense Adriamycin and Cytoxan followed by Taxol 2. depending on the response breast conserving surgery versus mastectomy  3.  Adjuvant radiation 4.  Followed by adjuvant antiestrogen therapy with abemaciclib ----------------------------------------------------------------------------------------------------------------------------------------------------- Current treatment: Cycle 1 day 1 dose dense Adriamycin and Cytoxan Echocardiogram 12/27/2019: EF 60 to 65% Chemo education completed, chemo consent obtained Labs reviewed antiemetics were reviewed  Return to clinic in 1 week for toxicity check    No orders of the defined types were placed in this encounter.  The patient has a good understanding of the overall plan. she agrees with it. she will call with any problems that may develop before the next visit here.  Total time spent: 30 mins including face to face time and time spent for planning, charting and coordination of care  Nicholas Lose, MD 01/10/2020  I, Cloyde Reams Dorshimer, am acting as scribe for Dr. Nicholas Lose.  I have reviewed the above documentation for accuracy and completeness,  and I agree with the above.

## 2020-01-10 ENCOUNTER — Inpatient Hospital Stay: Payer: BC Managed Care – PPO

## 2020-01-10 ENCOUNTER — Other Ambulatory Visit: Payer: Self-pay

## 2020-01-10 ENCOUNTER — Encounter: Payer: Self-pay | Admitting: *Deleted

## 2020-01-10 ENCOUNTER — Inpatient Hospital Stay (HOSPITAL_BASED_OUTPATIENT_CLINIC_OR_DEPARTMENT_OTHER): Payer: BC Managed Care – PPO | Admitting: Hematology and Oncology

## 2020-01-10 ENCOUNTER — Encounter: Payer: Self-pay | Admitting: Licensed Clinical Social Worker

## 2020-01-10 ENCOUNTER — Encounter: Payer: Self-pay | Admitting: Hematology and Oncology

## 2020-01-10 DIAGNOSIS — C50411 Malignant neoplasm of upper-outer quadrant of right female breast: Secondary | ICD-10-CM

## 2020-01-10 DIAGNOSIS — J45909 Unspecified asthma, uncomplicated: Secondary | ICD-10-CM | POA: Diagnosis not present

## 2020-01-10 DIAGNOSIS — Z8673 Personal history of transient ischemic attack (TIA), and cerebral infarction without residual deficits: Secondary | ICD-10-CM | POA: Diagnosis not present

## 2020-01-10 DIAGNOSIS — Z5111 Encounter for antineoplastic chemotherapy: Secondary | ICD-10-CM | POA: Diagnosis not present

## 2020-01-10 DIAGNOSIS — E119 Type 2 diabetes mellitus without complications: Secondary | ICD-10-CM | POA: Diagnosis not present

## 2020-01-10 DIAGNOSIS — Z170 Estrogen receptor positive status [ER+]: Secondary | ICD-10-CM | POA: Diagnosis not present

## 2020-01-10 DIAGNOSIS — Z5189 Encounter for other specified aftercare: Secondary | ICD-10-CM | POA: Diagnosis not present

## 2020-01-10 DIAGNOSIS — I10 Essential (primary) hypertension: Secondary | ICD-10-CM | POA: Diagnosis not present

## 2020-01-10 LAB — CBC WITH DIFFERENTIAL (CANCER CENTER ONLY)
Abs Immature Granulocytes: 0.03 10*3/uL (ref 0.00–0.07)
Basophils Absolute: 0 10*3/uL (ref 0.0–0.1)
Basophils Relative: 0 %
Eosinophils Absolute: 0.1 10*3/uL (ref 0.0–0.5)
Eosinophils Relative: 3 %
HCT: 38.8 % (ref 36.0–46.0)
Hemoglobin: 12.9 g/dL (ref 12.0–15.0)
Immature Granulocytes: 1 %
Lymphocytes Relative: 58 %
Lymphs Abs: 1.7 10*3/uL (ref 0.7–4.0)
MCH: 29.3 pg (ref 26.0–34.0)
MCHC: 33.2 g/dL (ref 30.0–36.0)
MCV: 88.2 fL (ref 80.0–100.0)
Monocytes Absolute: 0.1 10*3/uL (ref 0.1–1.0)
Monocytes Relative: 4 %
Neutro Abs: 1 10*3/uL — ABNORMAL LOW (ref 1.7–7.7)
Neutrophils Relative %: 34 %
Platelet Count: 208 10*3/uL (ref 150–400)
RBC: 4.4 MIL/uL (ref 3.87–5.11)
RDW: 14.2 % (ref 11.5–15.5)
WBC Count: 2.9 10*3/uL — ABNORMAL LOW (ref 4.0–10.5)
nRBC: 0 % (ref 0.0–0.2)

## 2020-01-10 LAB — CMP (CANCER CENTER ONLY)
ALT: 68 U/L — ABNORMAL HIGH (ref 0–44)
AST: 28 U/L (ref 15–41)
Albumin: 3.6 g/dL (ref 3.5–5.0)
Alkaline Phosphatase: 150 U/L — ABNORMAL HIGH (ref 38–126)
Anion gap: 6 (ref 5–15)
BUN: 10 mg/dL (ref 6–20)
CO2: 31 mmol/L (ref 22–32)
Calcium: 9.3 mg/dL (ref 8.9–10.3)
Chloride: 100 mmol/L (ref 98–111)
Creatinine: 0.71 mg/dL (ref 0.44–1.00)
GFR, Estimated: >60 mL/min (ref >60)
Glucose, Bld: 118 mg/dL — ABNORMAL HIGH (ref 70–99)
Potassium: 4 mmol/L (ref 3.5–5.1)
Sodium: 137 mmol/L (ref 135–145)
Total Bilirubin: 0.5 mg/dL (ref 0.3–1.2)
Total Protein: 7.5 g/dL (ref 6.5–8.1)

## 2020-01-10 NOTE — Progress Notes (Signed)
Met with patient in lobby to introduce myself as Arboriculturist and to offer available resources.  Discussed one-time $1000 Radio broadcast assistant to assist with personal expenses while going through treatment. Also discussed available copay assistance for specific treatment drugs.if needed.   Gave her my card if interested in applying and for any additional financial questions or concerns.

## 2020-01-10 NOTE — Assessment & Plan Note (Signed)
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  Neoadjuvant chemotherapy with dose dense Adriamycin and Cytoxan followed by Taxol 2. depending on the response breast conserving surgery versus mastectomy  3.  Adjuvant radiation 4.  Followed by adjuvant antiestrogen therapy with abemaciclib ----------------------------------------------------------------------------------------------------------------------------------------------------- Current treatment: Cycle 1 day 1 dose dense Adriamycin and Cytoxan Echocardiogram 12/27/2019: EF 60 to 65% Chemo education completed, chemo consent obtained Labs reviewed antiemetics were reviewed  Return to clinic in 1 week for toxicity check

## 2020-01-10 NOTE — Progress Notes (Signed)
[name] CSW Progress Note  Holiday representative met with patient in exam room. Patient gave completed [employer] application. CSW submitted with supporting letter. Patient will be contacted by [facility] regarding assistance decision.    [name] , LCSW

## 2020-01-10 NOTE — Progress Notes (Signed)
[place] CSW Progress Note  Holiday representative met with patient in exam room. Patient gave completed [employer] application. CSW submitted with supporting letter. Patient will be contacted by [facility] regarding assistance decision.    [name] , LCSW

## 2020-01-11 ENCOUNTER — Other Ambulatory Visit: Payer: Self-pay | Admitting: Hematology and Oncology

## 2020-01-11 DIAGNOSIS — Z170 Estrogen receptor positive status [ER+]: Secondary | ICD-10-CM

## 2020-01-14 ENCOUNTER — Telehealth: Payer: Self-pay | Admitting: Hematology and Oncology

## 2020-01-14 NOTE — Telephone Encounter (Signed)
Per 11/11 los, unable to move the genetics appts on 11/16. Left a msg for pt letting her know that her appt is still scheduled for 11/16

## 2020-01-15 ENCOUNTER — Other Ambulatory Visit: Payer: Self-pay

## 2020-01-15 ENCOUNTER — Inpatient Hospital Stay (HOSPITAL_BASED_OUTPATIENT_CLINIC_OR_DEPARTMENT_OTHER): Payer: BC Managed Care – PPO | Admitting: Genetic Counselor

## 2020-01-15 ENCOUNTER — Inpatient Hospital Stay: Payer: BC Managed Care – PPO

## 2020-01-15 ENCOUNTER — Telehealth: Payer: Self-pay | Admitting: Licensed Clinical Social Worker

## 2020-01-15 ENCOUNTER — Other Ambulatory Visit: Payer: Self-pay | Admitting: Genetic Counselor

## 2020-01-15 ENCOUNTER — Encounter: Payer: Self-pay | Admitting: Genetic Counselor

## 2020-01-15 DIAGNOSIS — Z803 Family history of malignant neoplasm of breast: Secondary | ICD-10-CM

## 2020-01-15 DIAGNOSIS — C50411 Malignant neoplasm of upper-outer quadrant of right female breast: Secondary | ICD-10-CM

## 2020-01-15 DIAGNOSIS — Z170 Estrogen receptor positive status [ER+]: Secondary | ICD-10-CM

## 2020-01-15 DIAGNOSIS — Z800 Family history of malignant neoplasm of digestive organs: Secondary | ICD-10-CM | POA: Insufficient documentation

## 2020-01-15 NOTE — Telephone Encounter (Signed)
[name]   CSW received TC from patient who was crying, stating that she had given FMLA forms to Dr. Lindi Adie but her company has not received them back and they were due, so she has not been approved for leave yet. CSW talked patient through a breathing exercise to manage anxiety and informed patient that I would reach out to nurse and our FMLA specialist to determine the status of the forms. Patient expressed gratitude and was calmer at the end of the call.  CSW sent message to Dr. Geralyn Flash nurse and to R. Winston-Spruiell to determine status of FMLA forms.   Cathy Douglas, LCSW

## 2020-01-15 NOTE — Progress Notes (Signed)
REFERRING PROVIDER: Stark Klein, MD [address] [place],  [place] [address]  PRIMARY PROVIDER:  Forrest Moron, MD  PRIMARY REASON FOR VISIT:  1. Malignant neoplasm of upper-outer quadrant of right breast in female, estrogen receptor positive ([place])   2. Family history of breast cancer   3. Family history of colon cancer   4. Family history of liver cancer      HISTORY OF PRESENT ILLNESS:   Ms. Hutt, a 49 y.o. female, was seen for a [place] cancer genetics consultation at the request of Dr. Barry Dienes due to a personal and family history of cancer.  Ms. Hiltz presents to clinic today to discuss the possibility of a hereditary predisposition to cancer, genetic testing, and to further clarify her future cancer risks, as well as potential cancer risks for family members.   In October of 2021, at the age of 34, Ms. Yarbrough was diagnosed with invasive ductal carcinoma, ER+/PR-/Her2-, of the right breast. The treatment plan includes neoadjuvant chemotherapy, surgery, radiation therapy, and antiestrogen therapy.     CANCER HISTORY:  Oncology History  Malignant neoplasm of upper-outer quadrant of right breast in female, estrogen receptor positive ([place])  12/13/2019 Initial Diagnosis   Screening detected right breast masses 11:30 position: 4.3 cm: Biopsy grade 3 IDC ER 90% PR 0%, KI 40%, HER-2 negative 12 o'clock position: 1.9 cm: Biopsy: Grade 3 IDC ER 40%, PR 0%, HER-2 negative, Ki-67 40%, right axillary lymph node biopsied benign but discordant   12/19/2019 Cancer Staging   Staging form: Breast, AJCC 8th Edition - Clinical stage from 12/19/2019: Stage IIIA (cT2, cN1, cM0, G3, ER+, PR-, HER2-) - Signed by Nicholas Lose, MD on 12/19/2019   01/03/2020 -  Chemotherapy   The patient had dexamethasone (DECADRON) 4 MG tablet, 4 mg (100 % of original dose 4 mg), Oral, Daily, 1 of 1 cycle, Start date: 12/19/2019, End date: -- Dose modification: 4 mg (original dose 4 mg, Cycle  0) DOXOrubicin (ADRIAMYCIN) chemo injection 146 mg, 60 mg/m2 = 146 mg, Intravenous,  Once, 1 of 4 cycles Administration: 146 mg (01/03/2020) palonosetron (ALOXI) injection 0.25 mg, 0.25 mg, Intravenous,  Once, 1 of 8 cycles Administration: 0.25 mg (01/03/2020) pegfilgrastim-jmdb (FULPHILA) injection 6 mg, 6 mg, Subcutaneous,  Once, 1 of 4 cycles CARBOplatin (PARAPLATIN) 700 mg in sodium chloride 0.9 % 250 mL chemo infusion, 700 mg (100 % of original dose 700 mg), Intravenous,  Once, 0 of 4 cycles Dose modification: 700 mg (original dose 700 mg, Cycle 5) cyclophosphamide (CYTOXAN) 1,460 mg in sodium chloride 0.9 % 250 mL chemo infusion, 600 mg/m2 = 1,460 mg, Intravenous,  Once, 1 of 4 cycles Administration: 1,460 mg (01/03/2020) PACLitaxel (TAXOL) 192 mg in sodium chloride 0.9 % 250 mL chemo infusion (</= 29m/m2), 80 mg/m2 = 192 mg, Intravenous,  Once, 0 of 4 cycles fosaprepitant (EMEND) 150 mg in sodium chloride 0.9 % 145 mL IVPB, 150 mg, Intravenous,  Once, 1 of 8 cycles Administration: 150 mg (01/03/2020)  for chemotherapy treatment.       RISK FACTORS:  Menarche was at age [age]  First live birth at age 49  OCP use for approximately 15 years.  Ovaries intact: yes.  Hysterectomy: no.  Menopausal status: ablation.  HRT use: 0 years. Colonoscopy: yes; 2013 - normal. Mammogram within the last year: yes. Number of breast biopsies: 3. Any excessive radiation exposure in the past: no   Past Medical History:  Diagnosis Date   Asthma  Breast cancer ([place]) 11/2019   Depression    Diabetes mellitus without complication ([place])    diet controlled   Family history of breast cancer    Family history of colon cancer    Family history of liver cancer    Hypertension    Stroke [facility])    TIA no deficits    Past Surgical History:  Procedure Laterality Date   BREAST BIOPSY Right    COLONOSCOPY  07/13/2011   Procedure: COLONOSCOPY;  Surgeon: Gatha Mayer, MD;  Location: [facility];  Service: Endoscopy;  Laterality: N/A;   DILATION AND CURETTAGE OF UTERUS  abortion   DILITATION & CURRETTAGE/HYSTROSCOPY WITH NOVASURE ABLATION N/A 09/28/2012   Procedure: DILATATION & CURETTAGE/HYSTEROSCOPY WITH NOVASURE ABLATION; And Resectoscope;  Surgeon: Marvene Staff, MD;  Location: [facility];  Service: Gynecology;  Laterality: N/A;   ESOPHAGOGASTRODUODENOSCOPY  07/13/2011   Procedure: ESOPHAGOGASTRODUODENOSCOPY (EGD);  Surgeon: Gatha Mayer, MD;  Location: [facility] ENDOSCOPY;  Service: Endoscopy;  Laterality: N/A;   PORTACATH PLACEMENT N/A 01/02/2020   Procedure: INSERTION PORT-A-CATH;  Surgeon: Stark Klein, MD;  Location: [facility];  Service: General;  Laterality: N/A;    Social History   Socioeconomic History   Marital status: Single    Spouse name: Not on file   Number of children: Not on file   Years of education: Not on file   Highest education level: Not on file  Occupational History   Not on file  Tobacco Use   Smoking status: Never Smoker   Smokeless tobacco: Never Used  Vaping Use   Vaping Use: Never used  Substance and Sexual Activity   Alcohol use: Yes    Alcohol/week: 3.0 standard drinks    Types: 3 Cans of beer per week    Comment: twice a week   Drug use: No   Sexual activity: Yes    Birth control/protection: Condom  Other Topics Concern   Not on file  Social History Narrative   Not on file   Social Determinants of Health   Financial Resource Strain:    Difficulty of Paying Living Expenses: Not on file  Food Insecurity:    Worried About Charity fundraiser in the Last Year: Not on file   [employer] of Food in the Last Year: Not on file  Transportation Needs:    Lack of Transportation (Medical): Not on file   Lack of Transportation (Non-Medical): Not on file  Physical Activity:    Days of Exercise per Week: Not on file   Minutes of Exercise per Session: Not on file  Stress:    Feeling of Stress :  Not on file  Social Connections:    Frequency of Communication with Friends and Family: Not on file   Frequency of Social Gatherings with Friends and Family: Not on file   Attends Religious Services: Not on file   Active Member of Clubs or Organizations: Not on file   Attends Archivist Meetings: Not on file   Marital Status: Not on file     FAMILY HISTORY:  We obtained a detailed, 4-generation family history.  Significant diagnoses are listed below: Family History  Problem Relation Age of Onset   Breast cancer Half-Sister 41   Liver cancer Father        dx early 78s   Breast cancer Maternal Aunt        dx 20s   Colon cancer Maternal Aunt  dx 12s   Cancer Paternal Uncle        unknown type, dx >50   Ms. Soderberg has one son (age 55). She has two maternal half-sisters, two paternal half-sisters, and three paternal half-brothers. One of her maternal half-sisters has a history of breast cancer diagnosed at the age of 46.   Ms. Glade mother died at the age of 51 and did not have cancer. Ms. Sitts had two maternal aunts. One aunt had breast cancer and colon cancer diagnosed in her 85s. Her maternal grandparents died older than 19.   Ms. Mellott father died at the age of 42 from liver cancer that was diagnosed in his early 54s. She had two paternal uncles. One uncle had cancer (unknown type) diagnosed older than 47. Her paternal grandparents both died older than 58.   Ms. Meldrum is unaware of previous family history of genetic testing for hereditary cancer risks. Patient's ancestors are of unknown descent. There is no reported Ashkenazi Jewish ancestry. There is no known consanguinity.  GENETIC COUNSELING ASSESSMENT: Ms. Hashimi is a 49 y.o. female with a personal history of breast cancer and a family history of breast cancer and colon cancer, which is somewhat suggestive of a hereditary cancer syndrome and predisposition to cancer. We, therefore, discussed and  recommended the following at today's visit.   DISCUSSION: We discussed that approximately 5-10% of breast cancer is hereditary, with most cases associated with the BRCA1 and BRCA2 genes. There are other genes that can be associated with hereditary breast cancer syndromes. These include ATM, CHEK2, PALB2, etc. We discussed that testing is beneficial for several reasons, including knowing about other cancer risks, identifying potential screening and risk-reduction options that may be appropriate, and to understand if other family members could be at risk for cancer and allow them to undergo genetic testing.  We reviewed the characteristics, features and inheritance patterns of hereditary cancer syndromes. We also discussed genetic testing, including the appropriate family members to test, the process of testing, insurance coverage and turn-around-time for results. We discussed the implications of a negative, positive and/or variant of uncertain significant result. We recommended Ms. Hynson pursue genetic testing for the [facility] CustomNext-Cancer + RNAinsight panel.   The CustomNext-Cancer+RNAinsight panel offered by [facility] includes sequencing and rearrangement analysis for the following 47 genes:  APC, ATM, AXIN2, BARD1, BMPR1A, BRCA1, BRCA2, BRIP1, CDH1, CDK4, CDKN2A, CHEK2, DICER1, EPCAM, GREM1, HOXB13, MEN1, MLH1, MSH2, MSH3, MSH6, MUTYH, NBN, NF1, NF2, NTHL1, PALB2, PMS2, POLD1, POLE, PTEN, RAD51C, RAD51D, RECQL, RET, SDHA, SDHAF2, SDHB, SDHC, SDHD, SMAD4, SMARCA4, STK11, TP53, TSC1, TSC2, and VHL.  RNA data is routinely analyzed for use in variant interpretation for all genes.  Based on Ms. Brosious's personal and family history of cancer, she meets medical criteria for genetic testing. Despite that she meets criteria, there may still be an out of pocket cost. We discussed that if her out of pocket cost for testing is over $100, the laboratory will reach out to let her know. If the out of pocket cost of  testing is less than $100 she will be billed by the genetic testing laboratory. Ms. Briel expressed concern regarding the potential cost of genetic testing. Therefore, we will request a billing preverification from the genetic testing laboratory prior to ordering testing in order to better estimate her out of pocket cost.  PLAN:  To better estimate her out of pocket cost for testing, we will request a billing preverification from the genetic testing laboratory ([place]).  Once we have the estimated out of pocket cost, we will reach out to Ms. Zaccaro to let her know and she will decide whether she feels comfortable proceeding with genetic testing at that time. In the meantime, we recommend Ms. Shurley continue to follow the cancer screening guidelines given by her primary healthcare provider.  Ms. Begin questions were answered to her satisfaction today. Our contact information was provided should additional questions or concerns arise. Thank you for the referral and allowing [place] to share in the care of your patient.   Clint Guy, [place], [facility] Licensed, Certified Dispensing [email or web address] Phone: [phone]  The patient was seen for a total of 30 minutes in face-to-face genetic counseling.  This patient was discussed with Drs. Magrinat, Lindi Adie and/or Burr Medico who agrees with the above.    _______________________________________________________________________ For Office Staff:  Number of people involved in session: 1 Was an Intern/ student involved with case: no

## 2020-01-17 ENCOUNTER — Encounter: Payer: Self-pay | Admitting: Licensed Clinical Social Worker

## 2020-01-17 ENCOUNTER — Inpatient Hospital Stay: Payer: BC Managed Care – PPO

## 2020-01-17 ENCOUNTER — Inpatient Hospital Stay: Payer: BC Managed Care – PPO | Admitting: Adult Health

## 2020-01-17 ENCOUNTER — Telehealth: Payer: Self-pay | Admitting: Hematology and Oncology

## 2020-01-17 ENCOUNTER — Other Ambulatory Visit: Payer: Self-pay

## 2020-01-17 ENCOUNTER — Encounter: Payer: Self-pay | Admitting: Adult Health

## 2020-01-17 VITALS — BP 115/70 | HR 88 | Temp 98.1°F | Resp 18 | Ht 66.0 in | Wt 274.0 lb

## 2020-01-17 DIAGNOSIS — Z8673 Personal history of transient ischemic attack (TIA), and cerebral infarction without residual deficits: Secondary | ICD-10-CM | POA: Diagnosis not present

## 2020-01-17 DIAGNOSIS — C50411 Malignant neoplasm of upper-outer quadrant of right female breast: Secondary | ICD-10-CM

## 2020-01-17 DIAGNOSIS — Z95828 Presence of other vascular implants and grafts: Secondary | ICD-10-CM | POA: Insufficient documentation

## 2020-01-17 DIAGNOSIS — Z170 Estrogen receptor positive status [ER+]: Secondary | ICD-10-CM

## 2020-01-17 DIAGNOSIS — J45909 Unspecified asthma, uncomplicated: Secondary | ICD-10-CM | POA: Diagnosis not present

## 2020-01-17 DIAGNOSIS — Z5189 Encounter for other specified aftercare: Secondary | ICD-10-CM | POA: Diagnosis not present

## 2020-01-17 DIAGNOSIS — Z5111 Encounter for antineoplastic chemotherapy: Secondary | ICD-10-CM | POA: Diagnosis not present

## 2020-01-17 DIAGNOSIS — I10 Essential (primary) hypertension: Secondary | ICD-10-CM | POA: Diagnosis not present

## 2020-01-17 DIAGNOSIS — E119 Type 2 diabetes mellitus without complications: Secondary | ICD-10-CM | POA: Diagnosis not present

## 2020-01-17 LAB — CMP (CANCER CENTER ONLY)
ALT: 65 U/L — ABNORMAL HIGH (ref 0–44)
AST: 25 U/L (ref 15–41)
Albumin: 3.5 g/dL (ref 3.5–5.0)
Alkaline Phosphatase: 157 U/L — ABNORMAL HIGH (ref 38–126)
Anion gap: 7 (ref 5–15)
BUN: 11 mg/dL (ref 6–20)
CO2: 29 mmol/L (ref 22–32)
Calcium: 9 mg/dL (ref 8.9–10.3)
Chloride: 103 mmol/L (ref 98–111)
Creatinine: 0.69 mg/dL (ref 0.44–1.00)
GFR, Estimated: >60 mL/min (ref >60)
Glucose, Bld: 111 mg/dL — ABNORMAL HIGH (ref 70–99)
Potassium: 3.7 mmol/L (ref 3.5–5.1)
Sodium: 139 mmol/L (ref 135–145)
Total Bilirubin: 0.3 mg/dL (ref 0.3–1.2)
Total Protein: 7 g/dL (ref 6.5–8.1)

## 2020-01-17 LAB — CBC WITH DIFFERENTIAL (CANCER CENTER ONLY)
Abs Immature Granulocytes: 1.25 10*3/uL — ABNORMAL HIGH (ref 0.00–0.07)
Basophils Absolute: 0 10*3/uL (ref 0.0–0.1)
Basophils Relative: 0 %
Eosinophils Absolute: 0.1 10*3/uL (ref 0.0–0.5)
Eosinophils Relative: 1 %
HCT: 35.1 % — ABNORMAL LOW (ref 36.0–46.0)
Hemoglobin: 11.8 g/dL — ABNORMAL LOW (ref 12.0–15.0)
Immature Granulocytes: 12 %
Lymphocytes Relative: 26 %
Lymphs Abs: 2.7 10*3/uL (ref 0.7–4.0)
MCH: 29.5 pg (ref 26.0–34.0)
MCHC: 33.6 g/dL (ref 30.0–36.0)
MCV: 87.8 fL (ref 80.0–100.0)
Monocytes Absolute: 1.4 10*3/uL — ABNORMAL HIGH (ref 0.1–1.0)
Monocytes Relative: 13 %
Neutro Abs: 4.9 10*3/uL (ref 1.7–7.7)
Neutrophils Relative %: 48 %
Platelet Count: 308 10*3/uL (ref 150–400)
RBC: 4 MIL/uL (ref 3.87–5.11)
RDW: 15.1 % (ref 11.5–15.5)
WBC Count: 10.4 10*3/uL (ref 4.0–10.5)
nRBC: 0.5 % — ABNORMAL HIGH (ref 0.0–0.2)

## 2020-01-17 LAB — GENETIC SCREENING ORDER

## 2020-01-17 MED ORDER — HEPARIN SOD (PORK) LOCK FLUSH 100 UNIT/ML IV SOLN
500.0000 [IU] | Freq: Once | INTRAVENOUS | Status: AC | PRN
  Administered 2020-01-17: 500 [IU]
  Filled 2020-01-17: qty 5

## 2020-01-17 MED ORDER — SODIUM CHLORIDE 0.9% FLUSH
10.0000 mL | Freq: Once | INTRAVENOUS | Status: AC
  Administered 2020-01-17: 10 mL
  Filled 2020-01-17: qty 10

## 2020-01-17 MED ORDER — PALONOSETRON HCL INJECTION 0.25 MG/5ML
0.2500 mg | Freq: Once | INTRAVENOUS | Status: AC
  Administered 2020-01-17: 0.25 mg via INTRAVENOUS

## 2020-01-17 MED ORDER — SODIUM CHLORIDE 0.9 % IV SOLN
150.0000 mg | Freq: Once | INTRAVENOUS | Status: AC
  Administered 2020-01-17: 150 mg via INTRAVENOUS
  Filled 2020-01-17: qty 150

## 2020-01-17 MED ORDER — SODIUM CHLORIDE 0.9 % IV SOLN
10.0000 mg | Freq: Once | INTRAVENOUS | Status: AC
  Administered 2020-01-17: 10 mg via INTRAVENOUS
  Filled 2020-01-17: qty 10

## 2020-01-17 MED ORDER — DOXORUBICIN HCL CHEMO IV INJECTION 2 MG/ML
60.0000 mg/m2 | Freq: Once | INTRAVENOUS | Status: AC
  Administered 2020-01-17: 146 mg via INTRAVENOUS
  Filled 2020-01-17: qty 73

## 2020-01-17 MED ORDER — SODIUM CHLORIDE 0.9 % IV SOLN
Freq: Once | INTRAVENOUS | Status: AC
  Filled 2020-01-17: qty 250

## 2020-01-17 MED ORDER — SODIUM CHLORIDE 0.9% FLUSH
10.0000 mL | INTRAVENOUS | Status: DC | PRN
  Administered 2020-01-17: 10 mL
  Filled 2020-01-17: qty 10

## 2020-01-17 MED ORDER — SODIUM CHLORIDE 0.9 % IV SOLN
600.0000 mg/m2 | Freq: Once | INTRAVENOUS | Status: AC
  Administered 2020-01-17: 1460 mg via INTRAVENOUS
  Filled 2020-01-17: qty 73

## 2020-01-17 MED ORDER — PALONOSETRON HCL INJECTION 0.25 MG/5ML
INTRAVENOUS | Status: AC
  Filled 2020-01-17: qty 5

## 2020-01-17 NOTE — Patient Instructions (Signed)
[facility] Discharge Instructions for Patients Receiving Chemotherapy  Today you received the following chemotherapy agents: doxorubicin/cyclophosphamide.  To help prevent nausea and vomiting after your treatment, we encourage you to take your nausea medication as directed.   If you develop nausea and vomiting that is not controlled by your nausea medication, call the clinic.   BELOW ARE SYMPTOMS THAT SHOULD BE REPORTED IMMEDIATELY:  *FEVER GREATER THAN 100.5 F  *CHILLS WITH OR WITHOUT FEVER  NAUSEA AND VOMITING THAT IS NOT CONTROLLED WITH YOUR NAUSEA MEDICATION  *UNUSUAL SHORTNESS OF BREATH  *UNUSUAL BRUISING OR BLEEDING  TENDERNESS IN MOUTH AND THROAT WITH OR WITHOUT PRESENCE OF ULCERS  *URINARY PROBLEMS  *BOWEL PROBLEMS  UNUSUAL RASH Items with * indicate a potential emergency and should be followed up as soon as possible.  Feel free to call the clinic should you have any questions or concerns. The clinic phone number is [phone].  Please show the CHEMO ALERT CARD at check-in to the Emergency Department and triage nurse.   

## 2020-01-17 NOTE — Progress Notes (Signed)
[place] CSW Progress Note  Holiday representative met with patient to provide ongoing support. Patient is feeling much more relaxed today as she received her FMLA forms from RN navigator and that issue is resolved. She also received approval from [employer] today. CSW signed patient up for [employer] and gave first installment.  Otherwise, patient is doing well. She felt fairly good after first treatment and said the NP stated that she already feels a difference in her breast. Patient is looking forward to spending time with some family over Thanksgiving.   CSW will continue to follow-up periodically through treatment. Patient may call as needed in between visits.    [name] , LCSW

## 2020-01-17 NOTE — Telephone Encounter (Signed)
Scheduled appts per 11/18 los. Pt stated she would refer to mychart for appts and AVS details.

## 2020-01-17 NOTE — Progress Notes (Signed)
[facility] up:    Cathy Moron, MD [phone] [address] [place] [address]   DIAGNOSIS: Cancer Staging Malignant neoplasm of upper-outer quadrant of right breast in female, estrogen receptor positive ([place]) Staging form: Breast, AJCC 8th Edition - Clinical stage from 12/19/2019: Stage IIIA (cT2, cN1, cM0, G3, ER+, PR-, HER2-) - Signed by Nicholas Lose, MD on 12/19/2019   SUMMARY OF ONCOLOGIC HISTORY: Oncology History  Malignant neoplasm of upper-outer quadrant of right breast in female, estrogen receptor positive ([place])  12/13/2019 Initial Diagnosis   Screening detected right breast masses 11:30 position: 4.3 cm: Biopsy grade 3 IDC ER 90% PR 0%, KI 40%, HER-2 negative 12 o'clock position: 1.9 cm: Biopsy: Grade 3 IDC ER 40%, PR 0%, HER-2 negative, Ki-67 40%, right axillary lymph node biopsied benign but discordant   12/19/2019 Cancer Staging   Staging form: Breast, AJCC 8th Edition - Clinical stage from 12/19/2019: Stage IIIA (cT2, cN1, cM0, G3, ER+, PR-, HER2-) - Signed by Nicholas Lose, MD on 12/19/2019   01/03/2020 -  Chemotherapy   The patient had dexamethasone (DECADRON) 4 MG tablet, 4 mg (100 % of original dose 4 mg), Oral, Daily, 1 of 1 cycle, Start date: 12/19/2019, End date: -- Dose modification: 4 mg (original dose 4 mg, Cycle 0) DOXOrubicin (ADRIAMYCIN) chemo injection 146 mg, 60 mg/m2 = 146 mg, Intravenous,  Once, 2 of 4 cycles Administration: 146 mg (01/03/2020), 146 mg (01/17/2020) palonosetron (ALOXI) injection 0.25 mg, 0.25 mg, Intravenous,  Once, 2 of 8 cycles Administration: 0.25 mg (01/03/2020), 0.25 mg (01/17/2020) pegfilgrastim-jmdb (FULPHILA) injection 6 mg, 6 mg, Subcutaneous,  Once, 2 of 4 cycles Administration: 6 mg (01/05/2020) CARBOplatin (PARAPLATIN) 700 mg in sodium chloride 0.9 % 250 mL chemo infusion, 700 mg (100 % of original dose 700 mg), Intravenous,  Once, 0 of 4 cycles Dose modification: 700 mg  (original dose 700 mg, Cycle 5) cyclophosphamide (CYTOXAN) 1,460 mg in sodium chloride 0.9 % 250 mL chemo infusion, 600 mg/m2 = 1,460 mg, Intravenous,  Once, 2 of 4 cycles Administration: 1,460 mg (01/03/2020), 1,460 mg (01/17/2020) PACLitaxel (TAXOL) 192 mg in sodium chloride 0.9 % 250 mL chemo infusion (</= 86m/m2), 80 mg/m2 = 192 mg, Intravenous,  Once, 0 of 4 cycles fosaprepitant (EMEND) 150 mg in sodium chloride 0.9 % 145 mL IVPB, 150 mg, Intravenous,  Once, 2 of 8 cycles Administration: 150 mg (01/03/2020), 150 mg (01/17/2020)  for chemotherapy treatment.      CURRENT THERAPY:neoadjuvant chemotherapy  INTERVAL HISTORY: TDublin Cantero89y.o. female returns for evaluation prior to receiving her second cycle of neoadjuvant chemotherapy with Doxorubicin and Cyclophosphamide.  She is fatigued and food tastes funny, otherwise she is tolerating treatment quite well.  She notes her right upper outer breast mass is softer and smaller.   Patient Active Problem List   Diagnosis Date Noted  . Port-A-Cath in place 01/17/2020  . Family history of breast cancer   . Family history of colon cancer   . Family history of liver cancer   . Malignant neoplasm of upper-outer quadrant of right breast in female, estrogen receptor positive ([facility] 12/19/2019  . B12 deficiency 07/12/2011  . NSAID long-term use-[employer] 07/12/2011  . Weakness 07/11/2011  . Iron deficiency anemia 07/11/2011  . Hypertension     has No Known Allergies.  MEDICAL HISTORY: Past Medical History:  Diagnosis Date  . Asthma   . Breast cancer ([facility] 11/2019  . Depression   .  Diabetes mellitus without complication (HCC)    diet controlled  . Family history of breast cancer   . Family history of colon cancer   . Family history of liver cancer   . Hypertension   . Stroke [facility])    TIA no deficits    SURGICAL HISTORY: Past Surgical History:  Procedure Laterality Date  . BREAST BIOPSY Right   . COLONOSCOPY  07/13/2011    Procedure: COLONOSCOPY;  Surgeon: Gatha Mayer, MD;  Location: [facility];  Service: Endoscopy;  Laterality: N/A;  . DILATION AND CURETTAGE OF UTERUS  abortion  . DILITATION & CURRETTAGE/HYSTROSCOPY WITH NOVASURE ABLATION N/A 09/28/2012   Procedure: DILATATION & CURETTAGE/HYSTEROSCOPY WITH NOVASURE ABLATION; And Resectoscope;  Surgeon: Marvene Staff, MD;  Location: [facility];  Service: Gynecology;  Laterality: N/A;  . ESOPHAGOGASTRODUODENOSCOPY  07/13/2011   Procedure: ESOPHAGOGASTRODUODENOSCOPY (EGD);  Surgeon: Gatha Mayer, MD;  Location: [facility] ENDOSCOPY;  Service: Endoscopy;  Laterality: N/A;  . PORTACATH PLACEMENT N/A 01/02/2020   Procedure: INSERTION PORT-A-CATH;  Surgeon: Stark Klein, MD;  Location: [facility];  Service: General;  Laterality: N/A;    SOCIAL HISTORY: Social History   Socioeconomic History  . Marital status: Single    Spouse name: Not on file  . Number of children: Not on file  . Years of education: Not on file  . Highest education level: Not on file  Occupational History  . Not on file  Tobacco Use  . Smoking status: Never Smoker  . Smokeless tobacco: Never Used  Vaping Use  . Vaping Use: Never used  Substance and Sexual Activity  . Alcohol use: Yes    Alcohol/week: 3.0 standard drinks    Types: 3 Cans of beer per week    Comment: twice a week  . Drug use: No  . Sexual activity: Yes    Birth control/protection: Condom  Other Topics Concern  . Not on file  Social History Narrative  . Not on file   Social Determinants of Health   Financial Resource Strain:   . Difficulty of Paying Living Expenses: Not on file  Food Insecurity:   . Worried About Charity fundraiser in the Last Year: Not on file  . Ran Out of Food in the Last Year: Not on file  Transportation Needs:   . Lack of Transportation (Medical): Not on file  . Lack of Transportation (Non-Medical): Not on file  Physical Activity:   . Days of Exercise per Week: Not on file   . Minutes of Exercise per Session: Not on file  Stress:   . Feeling of Stress : Not on file  Social Connections:   . Frequency of Communication with Friends and Family: Not on file  . Frequency of Social Gatherings with Friends and Family: Not on file  . Attends Religious Services: Not on file  . Active Member of Clubs or Organizations: Not on file  . Attends Archivist Meetings: Not on file  . Marital Status: Not on file  Intimate Partner Violence:   . Fear of Current or Ex-Partner: Not on file  . Emotionally Abused: Not on file  . Physically Abused: Not on file  . Sexually Abused: Not on file    FAMILY HISTORY: Family History  Problem Relation Age of Onset  . Breast cancer Half-Sister 64  . Liver cancer Father        dx early 76s  . Breast cancer Maternal Aunt  dx 70s  . Colon cancer Maternal Aunt        dx 70s  . Cancer Paternal Uncle        unknown type, dx >50    Review of Systems  Constitutional: Positive for fatigue. Negative for appetite change, chills, fever and unexpected weight change.  HENT:   Negative for hearing loss, lump/mass and trouble swallowing.   Eyes: Negative for eye problems and icterus.  Respiratory: Negative for chest tightness, cough and shortness of breath.   Cardiovascular: Negative for chest pain, leg swelling and palpitations.  Gastrointestinal: Negative for abdominal distention, abdominal pain, constipation, diarrhea, nausea and vomiting.  Endocrine: Negative for hot flashes.  Genitourinary: Negative for difficulty urinating.   Musculoskeletal: Negative for arthralgias.  Skin: Negative for itching and rash.  Neurological: Negative for dizziness, extremity weakness, headaches and numbness.  Hematological: Negative for adenopathy. Does not bruise/bleed easily.  Psychiatric/Behavioral: Negative for depression. The patient is not nervous/anxious.       PHYSICAL EXAMINATION  ECOG PERFORMANCE STATUS: 1 - Symptomatic but  completely ambulatory  Vitals:   01/17/20 1329  BP: 115/70  Pulse: 88  Resp: 18  Temp: 98.1 F (36.7 C)  SpO2: 98%    Physical Exam Constitutional:      General: She is not in acute distress.    Appearance: Normal appearance. She is not toxic-appearing.  HENT:     Head: Normocephalic and atraumatic.     Mouth/Throat:     Mouth: Mucous membranes are moist.     Comments: Thrush noted Eyes:     General: No scleral icterus. Cardiovascular:     Rate and Rhythm: Normal rate and regular rhythm.     Pulses: Normal pulses.     Heart sounds: Normal heart sounds.  Pulmonary:     Effort: Pulmonary effort is normal.     Breath sounds: Normal breath sounds.  Abdominal:     General: Abdomen is flat. Bowel sounds are normal. There is no distension.     Palpations: Abdomen is soft.     Tenderness: There is no abdominal tenderness.  Musculoskeletal:        General: No swelling.     Cervical back: Neck supple.  Lymphadenopathy:     Cervical: No cervical adenopathy.  Skin:    General: Skin is warm and dry.     Findings: No rash.  Neurological:     General: No focal deficit present.     Mental Status: She is alert.  Psychiatric:        Mood and Affect: Mood normal.        Behavior: Behavior normal.     LABORATORY DATA:  CBC    Component Value Date/Time   WBC 10.4 01/17/2020 1308   WBC 7.4 09/26/2012 1520   RBC 4.00 01/17/2020 1308   HGB 11.8 (L) 01/17/2020 1308   HGB 14.0 09/04/2018 1454   HCT 35.1 (L) 01/17/2020 1308   HCT 42.5 09/04/2018 1454   PLT 308 01/17/2020 1308   PLT 347 09/04/2018 1454   MCV 87.8 01/17/2020 1308   MCV 89 09/04/2018 1454   MCH 29.5 01/17/2020 1308   MCHC 33.6 01/17/2020 1308   RDW 15.1 01/17/2020 1308   RDW 14.6 09/04/2018 1454   LYMPHSABS 2.7 01/17/2020 1308   MONOABS 1.4 (H) 01/17/2020 1308   EOSABS 0.1 01/17/2020 1308   BASOSABS 0.0 01/17/2020 1308    CMP     Component Value Date/Time   NA 139 01/17/2020 1308     NA 139 06/20/2019  1716   K 3.7 01/17/2020 1308   CL 103 01/17/2020 1308   CO2 29 01/17/2020 1308   GLUCOSE 111 (H) 01/17/2020 1308   BUN 11 01/17/2020 1308   BUN 13 06/20/2019 1716   CREATININE 0.69 01/17/2020 1308   CALCIUM 9.0 01/17/2020 1308   PROT 7.0 01/17/2020 1308   PROT 7.4 06/20/2019 1716   ALBUMIN 3.5 01/17/2020 1308   ALBUMIN 4.6 06/20/2019 1716   AST 25 01/17/2020 1308   ALT 65 (H) 01/17/2020 1308   ALKPHOS 157 (H) 01/17/2020 1308   BILITOT 0.3 01/17/2020 1308   GFRNONAA >60 01/17/2020 1308   GFRAA 117 06/20/2019 1716        ASSESSMENT and PLAN:   Malignant neoplasm of upper-outer quadrant of right breast in female, estrogen receptor positive (HCC) 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%, HER-2 negative 12 o'clock position: 1.9 cm: Biopsy: Grade 3 IDC ER 40%, PR 0%, HER-2 negative, Ki-67 40%, right axillary lymph node biopsied benign but discordant T2N?M0 stage IIa versus stage IIIa (based on the lymph node)  Treatment plan: 1.  Neoadjuvant chemotherapy with dose dense Adriamycin and Cytoxan followed by Taxol 2. depending on the response breast conserving surgery versus mastectomy  3.  Adjuvant radiation 4.  Followed by adjuvant antiestrogen therapy with abemaciclib ----------------------------------------------------------------------------------------------------------------------------------------------------- Current treatment: Cycle 2 day 1 dose dense Adriamycin and Cytoxan Echocardiogram 12/27/2019: EF 60 to 65%  Her labs are stable.  She will proceed with her second cycle today  Thrush: to take fluconazole 100mg x 7 days starting on chemotherapy day today and for every chemotherapy cycle.    Her tumor is responding very nicely to the treatment.  Caitlyn was very encouraged with this.  She will return in 2 weeks for labs, f/u and cycle 3 of therapy.     No orders of the defined types were placed in  this encounter.   All questions were answered. The patient knows to call the clinic with any problems, questions or concerns. We can certainly see the patient much sooner if necessary.  Total encounter time: 20 minutes*   , NP 01/19/20 1:13 PM Medical Oncology and Hematology  [facility] [address] , [place] [address] Tel. [phone]    Fax. [phone]  *Total Encounter Time as defined by the Centers for Medicare and Medicaid Services includes, in addition to the face-to-face time of a patient visit (documented in the note above) non-face-to-face time: obtaining and reviewing outside history, ordering and reviewing medications, tests or procedures, care coordination (communications with other health care professionals or caregivers) and documentation in the medical record.  

## 2020-01-18 MED ORDER — FLUCONAZOLE 100 MG PO TABS: 100.0000 mg | ORAL_TABLET | Freq: Every day | ORAL | 0 refills | Status: DC

## 2020-01-19 ENCOUNTER — Other Ambulatory Visit: Payer: Self-pay

## 2020-01-19 ENCOUNTER — Inpatient Hospital Stay: Payer: BC Managed Care – PPO

## 2020-01-19 VITALS — BP 133/79 | HR 84 | Temp 97.2°F | Resp 18

## 2020-01-19 DIAGNOSIS — Z170 Estrogen receptor positive status [ER+]: Secondary | ICD-10-CM | POA: Diagnosis not present

## 2020-01-19 DIAGNOSIS — Z8673 Personal history of transient ischemic attack (TIA), and cerebral infarction without residual deficits: Secondary | ICD-10-CM | POA: Diagnosis not present

## 2020-01-19 DIAGNOSIS — C50411 Malignant neoplasm of upper-outer quadrant of right female breast: Secondary | ICD-10-CM | POA: Diagnosis not present

## 2020-01-19 DIAGNOSIS — Z5189 Encounter for other specified aftercare: Secondary | ICD-10-CM | POA: Diagnosis not present

## 2020-01-19 DIAGNOSIS — J45909 Unspecified asthma, uncomplicated: Secondary | ICD-10-CM | POA: Diagnosis not present

## 2020-01-19 DIAGNOSIS — Z5111 Encounter for antineoplastic chemotherapy: Secondary | ICD-10-CM | POA: Diagnosis not present

## 2020-01-19 DIAGNOSIS — I10 Essential (primary) hypertension: Secondary | ICD-10-CM | POA: Diagnosis not present

## 2020-01-19 DIAGNOSIS — E119 Type 2 diabetes mellitus without complications: Secondary | ICD-10-CM | POA: Diagnosis not present

## 2020-01-19 MED ORDER — PEGFILGRASTIM-JMDB 6 MG/0.6ML ~~LOC~~ SOSY
6.0000 mg | PREFILLED_SYRINGE | Freq: Once | SUBCUTANEOUS | Status: AC
  Administered 2020-01-19: 6 mg via SUBCUTANEOUS

## 2020-01-19 NOTE — Patient Instructions (Signed)

## 2020-01-19 NOTE — Assessment & Plan Note (Signed)
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  Neoadjuvant chemotherapy with dose dense Adriamycin and Cytoxan followed by Taxol 2. depending on the response breast conserving surgery versus mastectomy  3.  Adjuvant radiation 4.  Followed by adjuvant antiestrogen therapy with abemaciclib ----------------------------------------------------------------------------------------------------------------------------------------------------- Current treatment: Cycle 2 day 1 dose dense Adriamycin and Cytoxan Echocardiogram 12/27/2019: EF 60 to 65%  Her labs are stable.  She will proceed with her second cycle today  Thrush: to take fluconazole 145m x 7 days starting on chemotherapy day today and for every chemotherapy cycle.    Her tumor is responding very nicely to the treatment.  TConnawas very encouraged with this.  She will return in 2 weeks for labs, f/u and cycle 3 of therapy.

## 2020-01-24 ENCOUNTER — Other Ambulatory Visit: Payer: Self-pay | Admitting: Hematology and Oncology

## 2020-01-24 DIAGNOSIS — C50411 Malignant neoplasm of upper-outer quadrant of right female breast: Secondary | ICD-10-CM

## 2020-01-24 DIAGNOSIS — Z170 Estrogen receptor positive status [ER+]: Secondary | ICD-10-CM

## 2020-01-25 ENCOUNTER — Telehealth: Payer: Self-pay

## 2020-01-25 NOTE — Telephone Encounter (Signed)
Pt called stating she is experiencing back pain since receiving injection on 11/20. Pt states she has been taking Claritin and tylenol daily. This LPN advised pt to alternate ibuprofen and acetaminophen, and dry hot/cold compress for pain relief. Advised pt to call back Monday if this has not improved. Pt verbalized understanding and agreement.

## 2020-01-30 NOTE — Progress Notes (Signed)
Patient Care Team: Forrest Moron, MD as PCP - General (Internal Medicine) Rockwell Germany, RN as Oncology Nurse Navigator Rockwell Germany, RN as Oncology Nurse Navigator  DIAGNOSIS:    ICD-10-CM   1. Malignant neoplasm of upper-outer quadrant of right breast in female, estrogen receptor positive ([place])  C50.411    Z17.0     SUMMARY OF ONCOLOGIC HISTORY: Oncology History  Malignant neoplasm of upper-outer quadrant of right breast in female, estrogen receptor positive ([place])  12/13/2019 Initial Diagnosis   Screening detected right breast masses 11:30 position: 4.3 cm: Biopsy grade 3 IDC ER 90% PR 0%, KI 40%, HER-2 negative 12 o'clock position: 1.9 cm: Biopsy: Grade 3 IDC ER 40%, PR 0%, HER-2 negative, Ki-67 40%, right axillary lymph node biopsied benign but discordant   12/19/2019 Cancer Staging   Staging form: Breast, AJCC 8th Edition - Clinical stage from 12/19/2019: Stage IIIA (cT2, cN1, cM0, G3, ER+, PR-, HER2-) - Signed by Nicholas Lose, MD on 12/19/2019   01/03/2020 -  Chemotherapy   The patient had dexamethasone (DECADRON) 4 MG tablet, 4 mg (100 % of original dose 4 mg), Oral, Daily, 1 of 1 cycle, Start date: 12/19/2019, End date: -- Dose modification: 4 mg (original dose 4 mg, Cycle 0) DOXOrubicin (ADRIAMYCIN) chemo injection 146 mg, 60 mg/m2 = 146 mg, Intravenous,  Once, 2 of 4 cycles Administration: 146 mg (01/03/2020), 146 mg (01/17/2020) palonosetron (ALOXI) injection 0.25 mg, 0.25 mg, Intravenous,  Once, 2 of 8 cycles Administration: 0.25 mg (01/03/2020), 0.25 mg (01/17/2020) pegfilgrastim-jmdb (FULPHILA) injection 6 mg, 6 mg, Subcutaneous,  Once, 2 of 4 cycles Administration: 6 mg (01/05/2020) CARBOplatin (PARAPLATIN) 700 mg in sodium chloride 0.9 % 250 mL chemo infusion, 700 mg (100 % of original dose 700 mg), Intravenous,  Once, 0 of 4 cycles Dose modification: 700 mg (original dose 700 mg, Cycle 5) cyclophosphamide (CYTOXAN) 1,460 mg in sodium chloride 0.9 % 250  mL chemo infusion, 600 mg/m2 = 1,460 mg, Intravenous,  Once, 2 of 4 cycles Administration: 1,460 mg (01/03/2020), 1,460 mg (01/17/2020) PACLitaxel (TAXOL) 192 mg in sodium chloride 0.9 % 250 mL chemo infusion (</= 22m/m2), 80 mg/m2 = 192 mg, Intravenous,  Once, 0 of 4 cycles fosaprepitant (EMEND) 150 mg in sodium chloride 0.9 % 145 mL IVPB, 150 mg, Intravenous,  Once, 2 of 8 cycles Administration: 150 mg (01/03/2020), 150 mg (01/17/2020)  for chemotherapy treatment.      CHIEF COMPLIANT: Cycle 3 Adriamycin and Cytoxan  INTERVAL HISTORY: Cathy Parrish a 49y.o. with above-mentioned history of rightbreast cancer currently on neoadjuvant chemotherapy with dose dense Adriamycin and Cytoxan. She presents to the clinic today for a toxicity check and cycle 3.   ALLERGIES:  has No Known Allergies.  MEDICATIONS:  Current Outpatient Medications  Medication Sig Dispense Refill   albuterol (PROVENTIL HFA;VENTOLIN HFA) 108 (90 BASE) MCG/ACT inhaler Inhale 2 puffs into the lungs every 4 (four) hours as needed for wheezing. 1 Inhaler 0   ALPRAZolam (XANAX) 0.5 MG tablet TAKE 1 TABLET(0.5 MG) BY MOUTH AT BEDTIME AS NEEDED FOR ANXIETY 30 tablet 0   Aspirin-Salicylamide-Caffeine (BC HEADACHE POWDER PO) Take 1 packet by mouth daily as needed (for headache).     atorvastatin (LIPITOR) 20 MG tablet Take 1 tablet (20 mg total) by mouth daily. 90 tablet 3   dexamethasone (DECADRON) 4 MG tablet Take 1 tablet (4 mg total) by mouth daily. Take 1 tablet day after chemo and 1 tablet 2 days after chemo  with food 8 tablet 0   fluconazole (DIFLUCAN) 100 MG tablet Take 1 tablet (100 mg total) by mouth daily. 30 tablet 0   fluticasone (FLONASE) 50 MCG/ACT nasal spray Place 2 sprays into both nostrils 2 (two) times daily. Decrease to 2 sprays/nostril daily after 5 days 16 g 2   ibuprofen (ADVIL) 800 MG tablet Take 800 mg by mouth 3 (three) times daily.     ibuprofen (ADVIL,MOTRIN) 200 MG tablet Take 4 tablets  (800 mg total) by mouth every 6 (six) hours as needed for pain. 30 tablet 4   irbesartan-hydrochlorothiazide (AVALIDE) 150-12.5 MG tablet Take 1 tablet by mouth daily. 90 tablet 2   lidocaine-prilocaine (EMLA) cream Apply to affected area once 30 g 3   LORazepam (ATIVAN) 0.5 MG tablet Take 1 tablet (0.5 mg total) by mouth at bedtime as needed for sleep. 30 tablet 0   ondansetron (ZOFRAN) 8 MG tablet Take 1 tablet (8 mg total) by mouth 2 (two) times daily as needed. Start on the third day after chemotherapy. 30 tablet 1   oxyCODONE (OXY IR/ROXICODONE) 5 MG immediate release tablet Take 1 tablet (5 mg total) by mouth every 6 (six) hours as needed for severe pain. 15 tablet 0   prochlorperazine (COMPAZINE) 10 MG tablet TAKE 1 TABLET BY MOUTH EVERY 6 HOURS AS NEEDED FOR NAUSEA 30 tablet 1   QUEtiapine (SEROQUEL) 25 MG tablet TAKE 1 TO 2 TABLETS(25 TO 50 MG) BY MOUTH AT BEDTIME 90 tablet 3   SUMAtriptan (IMITREX) 50 MG tablet Take one tablet by mouth at the first sign of headache. May repeat in 2 hours if headache persists or recurs. 10 tablet 0   Tetrahydrozoline HCl (VISINE OP) Apply 1 drop to eye as needed (for allergies).     No current facility-administered medications for this visit.   Facility-Administered Medications Ordered in Other Visits  Medication Dose Route Frequency Provider Last Rate Last Admin   sodium chloride flush (NS) 0.9 % injection 10 mL  10 mL Intracatheter Once Nicholas Lose, MD        PHYSICAL EXAMINATION: ECOG PERFORMANCE STATUS: 1 - Symptomatic but completely ambulatory  There were no vitals filed for this visit. There were no vitals filed for this visit.  LABORATORY DATA:  I have reviewed the data as listed CMP Latest Ref Rng & Units 01/17/2020 01/10/2020 01/03/2020  Glucose 70 - 99 mg/dL 111(H) 118(H) 150(H)  BUN 6 - 20 mg/dL _0 Creatinine 0.44 - 1.00 mg/dL 0.69 0.71 0.72  Sodium 135 - 145 mmol/L 139 137 138  Potassium 3.5 - 5.1 mmol/L 3.7 4.0  4.0  Chloride 98 - 111 mmol/L 103 100 104  CO2 22 - 32 mmol/L _1 Calcium 8.9 - 10.3 mg/dL 9.0 9.3 9.0  Total Protein 6.5 - 8.1 g/dL 7.0 7.5 7.4  Total Bilirubin 0.3 - 1.2 mg/dL 0.3 0.5 0.4  Alkaline Phos 38 - 126 U/L 157(H) 150(H) 104  AST 15 - 41 U/L _2 ALT 0 - 44 U/L 65(H) 68(H) 15    Lab Results  Component Value Date   WBC 10.4 01/17/2020   HGB 11.8 (L) 01/17/2020   HCT 35.1 (L) 01/17/2020   MCV 87.8 01/17/2020   PLT 308 01/17/2020   NEUTROABS 4.9 01/17/2020    ASSESSMENT & PLAN:  Malignant neoplasm of upper-outer quadrant of right breast in female, estrogen receptor positive ([place]) 12/13/2019:Screening detected right breast masses 11:30 position: 4.3 cm: Biopsy grade 3  IDC ER 90% PR 0%, KI 40%, HER-2 negative 12 o'clock position: 1.9 cm: Biopsy: Grade 3 IDC ER 40%, PR 0%, HER-2 negative, Ki-67 40%, right axillary lymph node biopsied benign but discordant T2N?M0 stage IIa versus stage IIIa (based on the lymph node)  Treatment plan: 1.Neoadjuvant chemotherapy with dose dense Adriamycin and Cytoxan followed by Taxol 2.depending on the response breast conserving surgery versus mastectomy 3.Adjuvant radiation 4.Followed by adjuvant antiestrogen therapy with abemaciclib ----------------------------------------------------------------------------------------------------------------------------------------------------- Current treatment: Cycle 3 day 1 dose dense Adriamycin and Cytoxan Echocardiogram 12/27/2019: EF 60 to 65%  Chemo Toxicities: 1.  Mild nausea: Denies any vomiting.  Antinausea medicines are helping. 2. monitoring for chemo related toxicities and blood counts.  Today labs have not been drawn yet because of port issues.  Cathflo is being placed.  Awaiting lab work for today.  Thrush: to take fluconazole 144m x 7 days starting on chemotherapy for every chemotherapy cycle.    She will return in 2 weeks for labs, f/u      No orders of the  defined types were placed in this encounter.  The patient has a good understanding of the overall plan. she agrees with it. she will call with any problems that may develop before the next visit here.  Total time spent: 30 mins including face to face time and time spent for planning, charting and coordination of care  GNicholas Lose MD 01/31/2020  I, MCloyde ReamsDorshimer, am acting as scribe for Dr. VNicholas Lose  I have reviewed the above documentation for accuracy and completeness, and I agree with the above.      OFaythe Gheemay be check it at home our pressures are always high

## 2020-01-30 NOTE — Assessment & Plan Note (Signed)
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%, HER-2 negative 12 o'clock position: 1.9 cm: Biopsy: Grade 3 IDC ER 40%, PR 0%, HER-2 negative, Ki-67 40%, right axillary lymph node biopsied benign but discordant T2N?M0 stage IIa versus stage IIIa (based on the lymph node)  Treatment plan: 1.Neoadjuvant chemotherapy with dose dense Adriamycin and Cytoxan followed by Taxol 2.depending on the response breast conserving surgery versus mastectomy 3.Adjuvant radiation 4.Followed by adjuvant antiestrogen therapy with abemaciclib ----------------------------------------------------------------------------------------------------------------------------------------------------- Current treatment: Cycle 3 day 1 dose dense Adriamycin and Cytoxan Echocardiogram 12/27/2019: EF 60 to 65%  Chemo Toxicities:  Thrush: to take fluconazole 121m x 7 days starting on chemotherapy day today and for every chemotherapy cycle.    She will return in 2 weeks for labs, f/u and cycle 4 of therapy.

## 2020-01-31 ENCOUNTER — Inpatient Hospital Stay: Payer: BC Managed Care – PPO

## 2020-01-31 ENCOUNTER — Inpatient Hospital Stay: Payer: BC Managed Care – PPO | Admitting: Hematology and Oncology

## 2020-01-31 ENCOUNTER — Inpatient Hospital Stay: Payer: BC Managed Care – PPO | Attending: Hematology and Oncology

## 2020-01-31 ENCOUNTER — Encounter: Payer: Self-pay | Admitting: Licensed Clinical Social Worker

## 2020-01-31 ENCOUNTER — Other Ambulatory Visit: Payer: Self-pay

## 2020-01-31 ENCOUNTER — Encounter: Payer: Self-pay | Admitting: *Deleted

## 2020-01-31 DIAGNOSIS — Z170 Estrogen receptor positive status [ER+]: Secondary | ICD-10-CM

## 2020-01-31 DIAGNOSIS — Z803 Family history of malignant neoplasm of breast: Secondary | ICD-10-CM | POA: Diagnosis not present

## 2020-01-31 DIAGNOSIS — T451X5A Adverse effect of antineoplastic and immunosuppressive drugs, initial encounter: Secondary | ICD-10-CM | POA: Diagnosis not present

## 2020-01-31 DIAGNOSIS — Z8249 Family history of ischemic heart disease and other diseases of the circulatory system: Secondary | ICD-10-CM | POA: Diagnosis not present

## 2020-01-31 DIAGNOSIS — Z5189 Encounter for other specified aftercare: Secondary | ICD-10-CM | POA: Diagnosis not present

## 2020-01-31 DIAGNOSIS — E119 Type 2 diabetes mellitus without complications: Secondary | ICD-10-CM | POA: Diagnosis not present

## 2020-01-31 DIAGNOSIS — B379 Candidiasis, unspecified: Secondary | ICD-10-CM | POA: Diagnosis not present

## 2020-01-31 DIAGNOSIS — R432 Parageusia: Secondary | ICD-10-CM | POA: Insufficient documentation

## 2020-01-31 DIAGNOSIS — Z95828 Presence of other vascular implants and grafts: Secondary | ICD-10-CM

## 2020-01-31 DIAGNOSIS — C50411 Malignant neoplasm of upper-outer quadrant of right female breast: Secondary | ICD-10-CM | POA: Diagnosis not present

## 2020-01-31 DIAGNOSIS — C50919 Malignant neoplasm of unspecified site of unspecified female breast: Secondary | ICD-10-CM | POA: Diagnosis not present

## 2020-01-31 DIAGNOSIS — Z5111 Encounter for antineoplastic chemotherapy: Secondary | ICD-10-CM | POA: Insufficient documentation

## 2020-01-31 DIAGNOSIS — J45909 Unspecified asthma, uncomplicated: Secondary | ICD-10-CM | POA: Diagnosis not present

## 2020-01-31 DIAGNOSIS — D6481 Anemia due to antineoplastic chemotherapy: Secondary | ICD-10-CM | POA: Insufficient documentation

## 2020-01-31 DIAGNOSIS — Z800 Family history of malignant neoplasm of digestive organs: Secondary | ICD-10-CM | POA: Insufficient documentation

## 2020-01-31 DIAGNOSIS — Z79899 Other long term (current) drug therapy: Secondary | ICD-10-CM | POA: Insufficient documentation

## 2020-01-31 LAB — CMP (CANCER CENTER ONLY)
ALT: 21 U/L (ref 0–44)
AST: 12 U/L — ABNORMAL LOW (ref 15–41)
Albumin: 3.8 g/dL (ref 3.5–5.0)
Alkaline Phosphatase: 136 U/L — ABNORMAL HIGH (ref 38–126)
Anion gap: 6 (ref 5–15)
BUN: 13 mg/dL (ref 6–20)
CO2: 29 mmol/L (ref 22–32)
Calcium: 9.4 mg/dL (ref 8.9–10.3)
Chloride: 104 mmol/L (ref 98–111)
Creatinine: 0.72 mg/dL (ref 0.44–1.00)
GFR, Estimated: >60 mL/min (ref >60)
Glucose, Bld: 108 mg/dL — ABNORMAL HIGH (ref 70–99)
Potassium: 4 mmol/L (ref 3.5–5.1)
Sodium: 139 mmol/L (ref 135–145)
Total Bilirubin: 0.2 mg/dL — ABNORMAL LOW (ref 0.3–1.2)
Total Protein: 7.2 g/dL (ref 6.5–8.1)

## 2020-01-31 LAB — CBC WITH DIFFERENTIAL (CANCER CENTER ONLY)
Abs Immature Granulocytes: 1.72 10*3/uL — ABNORMAL HIGH (ref 0.00–0.07)
Basophils Absolute: 0 10*3/uL (ref 0.0–0.1)
Basophils Relative: 0 %
Eosinophils Absolute: 0.1 10*3/uL (ref 0.0–0.5)
Eosinophils Relative: 1 %
HCT: 35.5 % — ABNORMAL LOW (ref 36.0–46.0)
Hemoglobin: 11.8 g/dL — ABNORMAL LOW (ref 12.0–15.0)
Immature Granulocytes: 13 %
Lymphocytes Relative: 17 %
Lymphs Abs: 2.2 10*3/uL (ref 0.7–4.0)
MCH: 29.2 pg (ref 26.0–34.0)
MCHC: 33.2 g/dL (ref 30.0–36.0)
MCV: 87.9 fL (ref 80.0–100.0)
Monocytes Absolute: 1.5 10*3/uL — ABNORMAL HIGH (ref 0.1–1.0)
Monocytes Relative: 11 %
Neutro Abs: 7.7 10*3/uL (ref 1.7–7.7)
Neutrophils Relative %: 58 %
Platelet Count: 224 10*3/uL (ref 150–400)
RBC: 4.04 MIL/uL (ref 3.87–5.11)
RDW: 16.5 % — ABNORMAL HIGH (ref 11.5–15.5)
WBC Count: 13.3 10*3/uL — ABNORMAL HIGH (ref 4.0–10.5)
nRBC: 0.4 % — ABNORMAL HIGH (ref 0.0–0.2)

## 2020-01-31 MED ORDER — DICYCLOMINE HCL 10 MG PO CAPS: 10.0000 mg | ORAL_CAPSULE | Freq: Three times a day (TID) | ORAL | 0 refills | Status: DC | PRN

## 2020-01-31 MED ORDER — SODIUM CHLORIDE 0.9 % IV SOLN
150.0000 mg | Freq: Once | INTRAVENOUS | Status: AC
  Administered 2020-01-31: 150 mg via INTRAVENOUS
  Filled 2020-01-31: qty 150

## 2020-01-31 MED ORDER — HEPARIN SOD (PORK) LOCK FLUSH 100 UNIT/ML IV SOLN
500.0000 [IU] | Freq: Once | INTRAVENOUS | Status: DC | PRN
  Filled 2020-01-31: qty 5

## 2020-01-31 MED ORDER — PALONOSETRON HCL INJECTION 0.25 MG/5ML
0.2500 mg | Freq: Once | INTRAVENOUS | Status: AC
  Administered 2020-01-31: 0.25 mg via INTRAVENOUS

## 2020-01-31 MED ORDER — SODIUM CHLORIDE 0.9 % IV SOLN
10.0000 mg | Freq: Once | INTRAVENOUS | Status: AC
  Administered 2020-01-31: 10 mg via INTRAVENOUS
  Filled 2020-01-31: qty 10

## 2020-01-31 MED ORDER — PALONOSETRON HCL INJECTION 0.25 MG/5ML
INTRAVENOUS | Status: AC
  Filled 2020-01-31: qty 5

## 2020-01-31 MED ORDER — SODIUM CHLORIDE 0.9% FLUSH
10.0000 mL | INTRAVENOUS | Status: DC | PRN
  Filled 2020-01-31: qty 10

## 2020-01-31 MED ORDER — SODIUM CHLORIDE 0.9 % IV SOLN
Freq: Once | INTRAVENOUS | Status: AC
  Filled 2020-01-31: qty 250

## 2020-01-31 MED ORDER — DOXORUBICIN HCL CHEMO IV INJECTION 2 MG/ML
60.0000 mg/m2 | Freq: Once | INTRAVENOUS | Status: AC
  Administered 2020-01-31: 146 mg via INTRAVENOUS
  Filled 2020-01-31: qty 73

## 2020-01-31 MED ORDER — SODIUM CHLORIDE 0.9 % IV SOLN
600.0000 mg/m2 | Freq: Once | INTRAVENOUS | Status: AC
  Administered 2020-01-31: 1460 mg via INTRAVENOUS
  Filled 2020-01-31: qty 73

## 2020-01-31 MED ORDER — ALTEPLASE 2 MG IJ SOLR
2.0000 mg | Freq: Once | INTRAMUSCULAR | Status: AC
  Administered 2020-01-31: 2 mg
  Filled 2020-01-31: qty 2

## 2020-01-31 MED ORDER — SODIUM CHLORIDE 0.9% FLUSH
10.0000 mL | Freq: Once | INTRAVENOUS | Status: DC
  Filled 2020-01-31: qty 10

## 2020-01-31 NOTE — Progress Notes (Signed)
[facility] CSW Progress Note  Holiday representative met with patient in infusion. Patient is in good spirits. CSW provided 2nd installment of [place]. Patient planning to get a wig on Monday due to hair loss from chemo (Dr. Lindi Adie provided prescription).  CSW will continue to be available for support as patient goes through treatment.   [name] , LCSW

## 2020-01-31 NOTE — Progress Notes (Signed)
Pt discharged in no apparent distress. Pt left ambulatory without assistance. Pt aware of discharge instructions and verbalized understanding and had no further questions.  

## 2020-01-31 NOTE — Addendum Note (Signed)
Addended by: Kasandra Knudsen A on: 01/31/2020 02:50 PM   Modules accepted: Orders

## 2020-01-31 NOTE — Patient Instructions (Addendum)
[place] Discharge Instructions for Patients Receiving Chemotherapy  Today you received the following chemotherapy agents: doxorubicin/cyclophosphamide.  To help prevent nausea and vomiting after your treatment, we encourage you to take your nausea medication as directed.   If you develop nausea and vomiting that is not controlled by your nausea medication, call the clinic.   BELOW ARE SYMPTOMS THAT SHOULD BE REPORTED IMMEDIATELY:  *FEVER GREATER THAN 100.5 F  *CHILLS WITH OR WITHOUT FEVER  NAUSEA AND VOMITING THAT IS NOT CONTROLLED WITH YOUR NAUSEA MEDICATION  *UNUSUAL SHORTNESS OF BREATH  *UNUSUAL BRUISING OR BLEEDING  TENDERNESS IN MOUTH AND THROAT WITH OR WITHOUT PRESENCE OF ULCERS  *URINARY PROBLEMS  *BOWEL PROBLEMS  UNUSUAL RASH Items with * indicate a potential emergency and should be followed up as soon as possible.  Feel free to call the clinic should you have any questions or concerns. The clinic phone number is [phone].  Please show the [place] at check-in to the Emergency Department and triage nurse.   Implanted [facility] Guide An implanted port is a device that is placed under the skin. It is usually placed in the chest. The device can be used to give IV medicine, to take blood, or for dialysis. You may have an implanted port if:  You need IV medicine that would be irritating to the small veins in your hands or arms.  You need IV medicines, such as antibiotics, for a long period of time.  You need IV nutrition for a long period of time.  You need dialysis. Having a port means that your health care provider will not need to use the veins in your arms for these procedures. You may have fewer limitations when using a port than you would if you used other types of long-term IVs, and you will likely be able to return to normal activities after your incision heals. An implanted port has two main parts:  Reservoir. The reservoir  is the part where a needle is inserted to give medicines or draw blood. The reservoir is round. After it is placed, it appears as a small, raised area under your skin.  Catheter. The catheter is a thin, flexible tube that connects the reservoir to a vein. Medicine that is inserted into the reservoir goes into the catheter and then into the vein. How is my port accessed? To access your port:  A numbing cream may be placed on the skin over the port site.  Your health care provider will put on a mask and sterile gloves.  The skin over your port will be cleaned carefully with a germ-killing soap and allowed to dry.  Your health care provider will gently pinch the port and insert a needle into it.  Your health care provider will check for a blood return to make sure the port is in the vein and is not clogged.  If your port needs to remain accessed to get medicine continuously (constant infusion), your health care provider will place a clear bandage (dressing) over the needle site. The dressing and needle will need to be changed every week, or as told by your health care provider. What is flushing? Flushing helps keep the port from getting clogged. Follow instructions from your health care provider about how and when to flush the port. Ports are usually flushed with saline solution or a medicine called heparin. The need for flushing will depend on how the port is used:  If the port is only  used from time to time to give medicines or draw blood, the port may need to be flushed: ? Before and after medicines have been given. ? Before and after blood has been drawn. ? As part of routine maintenance. Flushing may be recommended every 4-6 weeks.  If a constant infusion is running, the port may not need to be flushed.  Throw away any syringes in a disposal container that is meant for sharp items (sharps container). You can buy a sharps container from a pharmacy, or you can make one by using an empty  hard plastic bottle with a cover. How long will my port stay implanted? The port can stay in for as long as your health care provider thinks it is needed. When it is time for the port to come out, a surgery will be done to remove it. The surgery will be similar to the procedure that was done to put the port in. Follow these instructions at home:   Flush your port as told by your health care provider.  If you need an infusion over several days, follow instructions from your health care provider about how to take care of your port site. Make sure you: ? Wash your hands with soap and water before you change your dressing. If soap and water are not available, use alcohol-based hand sanitizer. ? Change your dressing as told by your health care provider. ? Place any used dressings or infusion bags into a plastic bag. Throw that bag in the trash. ? Keep the dressing that covers the needle clean and dry. Do not get it wet. ? Do not use scissors or sharp objects near the tube. ? Keep the tube clamped, unless it is being used.  Check your port site every day for signs of infection. Check for: ? Redness, swelling, or pain. ? Fluid or blood. ? Pus or a bad smell.  Protect the skin around the port site. ? Avoid wearing bra straps that rub or irritate the site. ? Protect the skin around your port from seat belts. Place a soft pad over your chest if needed.  Bathe or shower as told by your health care provider. The site may get wet as long as you are not actively receiving an infusion.  Return to your normal activities as told by your health care provider. Ask your health care provider what activities are safe for you.  Carry a medical alert card or wear a medical alert bracelet at all times. This will let health care providers know that you have an implanted port in case of an emergency. Get help right away if:  You have redness, swelling, or pain at the port site.  You have fluid or blood coming  from your port site.  You have pus or a bad smell coming from the port site.  You have a fever. Summary  Implanted ports are usually placed in the chest for long-term IV access.  Follow instructions from your health care provider about flushing the port and changing bandages (dressings).  Take care of the area around your port by avoiding clothing that puts pressure on the area, and by watching for signs of infection.  Protect the skin around your port from seat belts. Place a soft pad over your chest if needed.  Get help right away if you have a fever or you have redness, swelling, pain, drainage, or a bad smell at the port site. This information is not intended to replace advice given  to you by your health care provider. Make sure you discuss any questions you have with your health care provider. Document Revised: 06/09/2018 Document Reviewed: 03/20/2016 Elsevier Patient Education  2020 [employer].

## 2020-02-02 ENCOUNTER — Inpatient Hospital Stay: Payer: BC Managed Care – PPO

## 2020-02-02 ENCOUNTER — Other Ambulatory Visit: Payer: Self-pay

## 2020-02-02 VITALS — BP 119/72 | HR 96 | Temp 98.2°F | Resp 18

## 2020-02-02 DIAGNOSIS — Z803 Family history of malignant neoplasm of breast: Secondary | ICD-10-CM | POA: Diagnosis not present

## 2020-02-02 DIAGNOSIS — Z5111 Encounter for antineoplastic chemotherapy: Secondary | ICD-10-CM | POA: Diagnosis not present

## 2020-02-02 DIAGNOSIS — B379 Candidiasis, unspecified: Secondary | ICD-10-CM | POA: Diagnosis not present

## 2020-02-02 DIAGNOSIS — T451X5A Adverse effect of antineoplastic and immunosuppressive drugs, initial encounter: Secondary | ICD-10-CM | POA: Diagnosis not present

## 2020-02-02 DIAGNOSIS — J45909 Unspecified asthma, uncomplicated: Secondary | ICD-10-CM | POA: Diagnosis not present

## 2020-02-02 DIAGNOSIS — D6481 Anemia due to antineoplastic chemotherapy: Secondary | ICD-10-CM | POA: Diagnosis not present

## 2020-02-02 DIAGNOSIS — R432 Parageusia: Secondary | ICD-10-CM | POA: Diagnosis not present

## 2020-02-02 DIAGNOSIS — C50411 Malignant neoplasm of upper-outer quadrant of right female breast: Secondary | ICD-10-CM

## 2020-02-02 DIAGNOSIS — Z800 Family history of malignant neoplasm of digestive organs: Secondary | ICD-10-CM | POA: Diagnosis not present

## 2020-02-02 DIAGNOSIS — Z8249 Family history of ischemic heart disease and other diseases of the circulatory system: Secondary | ICD-10-CM | POA: Diagnosis not present

## 2020-02-02 DIAGNOSIS — Z170 Estrogen receptor positive status [ER+]: Secondary | ICD-10-CM

## 2020-02-02 DIAGNOSIS — Z5189 Encounter for other specified aftercare: Secondary | ICD-10-CM | POA: Diagnosis not present

## 2020-02-02 DIAGNOSIS — Z79899 Other long term (current) drug therapy: Secondary | ICD-10-CM | POA: Diagnosis not present

## 2020-02-02 DIAGNOSIS — E119 Type 2 diabetes mellitus without complications: Secondary | ICD-10-CM | POA: Diagnosis not present

## 2020-02-02 MED ORDER — PEGFILGRASTIM-JMDB 6 MG/0.6ML ~~LOC~~ SOSY
6.0000 mg | PREFILLED_SYRINGE | Freq: Once | SUBCUTANEOUS | Status: AC
  Administered 2020-02-02: 6 mg via SUBCUTANEOUS

## 2020-02-02 NOTE — Patient Instructions (Signed)

## 2020-02-04 ENCOUNTER — Other Ambulatory Visit: Payer: Self-pay | Admitting: Hematology and Oncology

## 2020-02-04 DIAGNOSIS — Z170 Estrogen receptor positive status [ER+]: Secondary | ICD-10-CM

## 2020-02-04 DIAGNOSIS — C50411 Malignant neoplasm of upper-outer quadrant of right female breast: Secondary | ICD-10-CM

## 2020-02-05 ENCOUNTER — Telehealth: Payer: Self-pay | Admitting: Hematology and Oncology

## 2020-02-05 NOTE — Telephone Encounter (Signed)
No 12/2 los, no changes made to pt schedule

## 2020-02-13 ENCOUNTER — Encounter: Payer: Self-pay | Admitting: Hematology and Oncology

## 2020-02-13 NOTE — Assessment & Plan Note (Signed)
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%, HER-2 negative 12 o'clock position: 1.9 cm: Biopsy: Grade 3 IDC ER 40%, PR 0%, HER-2 negative, Ki-67 40%, right axillary lymph node biopsied benign but discordant T2N?M0 stage IIa versus stage IIIa (based on the lymph node)  Treatment plan: 1.Neoadjuvant chemotherapy with dose dense Adriamycin and Cytoxan followed by Taxol 2.depending on the response breast conserving surgery versus mastectomy 3.Adjuvant radiation 4.Followed by adjuvant antiestrogen therapy with abemaciclib ----------------------------------------------------------------------------------------------------------------------------------------------------- Current treatment: Cycle4day 1 dose dense Adriamycin and Cytoxan Echocardiogram 12/27/2019: EF 60 to 65%  Chemo Toxicities: 1.  Mild nausea: Denies any vomiting.  Antinausea medicines are helping. 2. monitoring for chemo related toxicities and blood counts.  Thrush: to take fluconazole 119m x 7 days starting on chemotherapy for every chemotherapy cycle.   She will return in 2 weeks for labs, f/u

## 2020-02-13 NOTE — Progress Notes (Signed)
Patient Care Team: Forrest Moron, MD as PCP - General (Internal Medicine) Rockwell Germany, RN as Oncology Nurse Navigator Rockwell Germany, RN as Oncology Nurse Navigator  DIAGNOSIS:    ICD-10-CM   1. Malignant neoplasm of upper-outer quadrant of right breast in female, estrogen receptor positive ([place])  C50.411    Z17.0     SUMMARY OF ONCOLOGIC HISTORY: Oncology History  Malignant neoplasm of upper-outer quadrant of right breast in female, estrogen receptor positive ([place])  12/13/2019 Initial Diagnosis   Screening detected right breast masses 11:30 position: 4.3 cm: Biopsy grade 3 IDC ER 90% PR 0%, KI 40%, HER-2 negative 12 o'clock position: 1.9 cm: Biopsy: Grade 3 IDC ER 40%, PR 0%, HER-2 negative, Ki-67 40%, right axillary lymph node biopsied benign but discordant   12/19/2019 Cancer Staging   Staging form: Breast, AJCC 8th Edition - Clinical stage from 12/19/2019: Stage IIIA (cT2, cN1, cM0, G3, ER+, PR-, HER2-) - Signed by Nicholas Lose, MD on 12/19/2019   01/03/2020 -  Chemotherapy   The patient had dexamethasone (DECADRON) 4 MG tablet, 4 mg (100 % of original dose 4 mg), Oral, Daily, 1 of 1 cycle, Start date: 12/19/2019, End date: -- Dose modification: 4 mg (original dose 4 mg, Cycle 0) DOXOrubicin (ADRIAMYCIN) chemo injection 146 mg, 60 mg/m2 = 146 mg, Intravenous,  Once, 3 of 4 cycles Administration: 146 mg (01/03/2020), 146 mg (01/17/2020), 146 mg (01/31/2020) palonosetron (ALOXI) injection 0.25 mg, 0.25 mg, Intravenous,  Once, 3 of 8 cycles Administration: 0.25 mg (01/03/2020), 0.25 mg (01/17/2020), 0.25 mg (01/31/2020) pegfilgrastim-jmdb (FULPHILA) injection 6 mg, 6 mg, Subcutaneous,  Once, 3 of 4 cycles Administration: 6 mg (01/05/2020), 6 mg (01/19/2020), 6 mg (02/02/2020) CARBOplatin (PARAPLATIN) 700 mg in sodium chloride 0.9 % 250 mL chemo infusion, 700 mg (100 % of original dose 700 mg), Intravenous,  Once, 0 of 4 cycles Dose modification: 700 mg (original dose 700  mg, Cycle 5) cyclophosphamide (CYTOXAN) 1,460 mg in sodium chloride 0.9 % 250 mL chemo infusion, 600 mg/m2 = 1,460 mg, Intravenous,  Once, 3 of 4 cycles Administration: 1,460 mg (01/03/2020), 1,460 mg (01/17/2020), 1,460 mg (01/31/2020) PACLitaxel (TAXOL) 192 mg in sodium chloride 0.9 % 250 mL chemo infusion (</= 33m/m2), 80 mg/m2 = 192 mg, Intravenous,  Once, 0 of 4 cycles fosaprepitant (EMEND) 150 mg in sodium chloride 0.9 % 145 mL IVPB, 150 mg, Intravenous,  Once, 3 of 8 cycles Administration: 150 mg (01/03/2020), 150 mg (01/17/2020), 150 mg (01/31/2020)  for chemotherapy treatment.      CHIEF COMPLIANT: Cycle 4 Adriamycin and Cytoxan  INTERVAL HISTORY: TMardella Nucklesis a 49y.o. with above-mentioned history of rightbreast cancercurrently on neoadjuvant chemotherapy with dose dense Adriamycin and Cytoxan.She presents to the clinic todayfor a toxicity check and cycle 4.   She tolerated cycle 3 chemotherapy extremely well without any nausea vomiting.  She did have profound fatigue.  Over the last several days she felt better and felt a lot of energy and good taste and appetite.  Her blood sugars today is markedly elevated.  ALLERGIES:  has No Known Allergies.  MEDICATIONS:  Current Outpatient Medications  Medication Sig Dispense Refill  . albuterol (PROVENTIL HFA;VENTOLIN HFA) 108 (90 BASE) MCG/ACT inhaler Inhale 2 puffs into the lungs every 4 (four) hours as needed for wheezing. 1 Inhaler 0  . ALPRAZolam (XANAX) 0.5 MG tablet TAKE 1 TABLET(0.5 MG) BY MOUTH AT BEDTIME AS NEEDED FOR ANXIETY 30 tablet 0  . Aspirin-Salicylamide-Caffeine (BC HEADACHE POWDER  PO) Take 1 packet by mouth daily as needed (for headache).    Marland Kitchen atorvastatin (LIPITOR) 20 MG tablet Take 1 tablet (20 mg total) by mouth daily. 90 tablet 3  . dexamethasone (DECADRON) 4 MG tablet Take 1 tablet (4 mg total) by mouth daily. Take 1 tablet day after chemo and 1 tablet 2 days after chemo with food 8 tablet 0  . dicyclomine  (BENTYL) 10 MG capsule Take 1 capsule (10 mg total) by mouth 3 (three) times daily as needed for spasms. 30 capsule 0  . fluconazole (DIFLUCAN) 100 MG tablet Take 1 tablet (100 mg total) by mouth daily. 30 tablet 0  . fluticasone (FLONASE) 50 MCG/ACT nasal spray Place 2 sprays into both nostrils 2 (two) times daily. Decrease to 2 sprays/nostril daily after 5 days 16 g 2  . irbesartan-hydrochlorothiazide (AVALIDE) 150-12.5 MG tablet Take 1 tablet by mouth daily. 90 tablet 2  . lidocaine-prilocaine (EMLA) cream Apply to affected area once 30 g 3  . LORazepam (ATIVAN) 0.5 MG tablet Take 1 tablet (0.5 mg total) by mouth at bedtime as needed for sleep. 30 tablet 0  . ondansetron (ZOFRAN) 8 MG tablet Take 1 tablet (8 mg total) by mouth 2 (two) times daily as needed. Start on the third day after chemotherapy. 30 tablet 1  . oxyCODONE (OXY IR/ROXICODONE) 5 MG immediate release tablet Take 1 tablet (5 mg total) by mouth every 6 (six) hours as needed for severe pain. 15 tablet 0  . prochlorperazine (COMPAZINE) 10 MG tablet TAKE 1 TABLET BY MOUTH EVERY 6 HOURS AS NEEDED FOR NAUSEA 30 tablet 1  . QUEtiapine (SEROQUEL) 25 MG tablet TAKE 1 TO 2 TABLETS(25 TO 50 MG) BY MOUTH AT BEDTIME 90 tablet 3  . SUMAtriptan (IMITREX) 50 MG tablet Take one tablet by mouth at the first sign of headache. May repeat in 2 hours if headache persists or recurs. 10 tablet 0  . Tetrahydrozoline HCl (VISINE OP) Apply 1 drop to eye as needed (for allergies).     No current facility-administered medications for this visit.    PHYSICAL EXAMINATION: ECOG PERFORMANCE STATUS: 1 - Symptomatic but completely ambulatory  Vitals:   02/14/20 0918  BP: 124/78  Pulse: 100  Resp: 17  Temp: 98.3 F (36.8 C)  SpO2: 99%   Filed Weights   02/14/20 0918  Weight: 276 lb 14.4 oz (125.6 kg)    LABORATORY DATA:  I have reviewed the data as listed CMP Latest Ref Rng & Units 02/14/2020 01/31/2020 01/17/2020  Glucose 70 - 99 mg/dL 185(H)  108(H) 111(H)  BUN 6 - 20 mg/dL _0 Creatinine 0.44 - 1.00 mg/dL 0.72 0.72 0.69  Sodium 135 - 145 mmol/L 142 139 139  Potassium 3.5 - 5.1 mmol/L 3.8 4.0 3.7  Chloride 98 - 111 mmol/L 106 104 103  CO2 22 - 32 mmol/L _1 Calcium 8.9 - 10.3 mg/dL 9.3 9.4 9.0  Total Protein 6.5 - 8.1 g/dL 6.8 7.2 7.0  Total Bilirubin 0.3 - 1.2 mg/dL 0.4 0.2(L) 0.3  Alkaline Phos 38 - 126 U/L 134(H) 136(H) 157(H)  AST 15 - 41 U/L 15 12(L) 25  ALT 0 - 44 U/L 24 21 65(H)    Lab Results  Component Value Date   WBC 13.2 (H) 02/14/2020   HGB 11.0 (L) 02/14/2020   HCT 32.5 (L) 02/14/2020   MCV 88.6 02/14/2020   PLT 231 02/14/2020   NEUTROABS 8.1 (H) 02/14/2020    ASSESSMENT &  PLAN:  Malignant neoplasm of upper-outer quadrant of right breast in female, estrogen receptor positive ([place]) 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%, HER-2 negative 12 o'clock position: 1.9 cm: Biopsy: Grade 3 IDC ER 40%, PR 0%, HER-2 negative, Ki-67 40%, right axillary lymph node biopsied benign but discordant T2N?M0 stage IIa versus stage IIIa (based on the lymph node)  Treatment plan: 1.Neoadjuvant chemotherapy with dose dense Adriamycin and Cytoxan followed by Taxol 2.depending on the response breast conserving surgery versus mastectomy 3.Adjuvant radiation 4.Followed by adjuvant antiestrogen therapy with abemaciclib ----------------------------------------------------------------------------------------------------------------------------------------------------- Current treatment: Cycle4day 1 dose dense Adriamycin and Cytoxan Echocardiogram 12/27/2019: EF 60 to 65%  Chemo Toxicities: 1.  Mild nausea: Denies any vomiting.  Antinausea medicines are helping. 2. monitoring for chemo related toxicities and blood counts. 3.  Chemo induced anemia: Hemoglobin is 11.0.  Thrush: to take fluconazole 145m x 7 days starting on  chemotherapy for every chemotherapy cycle.  Elevated blood sugars: I discussed with her about cutting down on carbohydrates. She will return in 2 weeks for labs and to receive first dose of Taxol and carboplatin.  No orders of the defined types were placed in this encounter.  The patient has a good understanding of the overall plan. she agrees with it. she will call with any problems that may develop before the next visit here.  Total time spent: 30 mins including face to face time and time spent for planning, charting and coordination of care  GNicholas Lose MD 02/14/2020  I, MCloyde ReamsDorshimer, am acting as scribe for Dr. VNicholas Lose  I have reviewed the above documentation for accuracy and completeness, and I agree with the above.

## 2020-02-13 NOTE — Progress Notes (Signed)
Patient called inquiring about the [employer] grant previously discussed.  Advised what is needed to apply. She will send via email and I will meet with her on 02/14/20 to complete process.  She verbalized understanding and has my card for any additional financial questions or concerns.

## 2020-02-14 ENCOUNTER — Inpatient Hospital Stay: Payer: BC Managed Care – PPO

## 2020-02-14 ENCOUNTER — Encounter: Payer: Self-pay | Admitting: Hematology and Oncology

## 2020-02-14 ENCOUNTER — Other Ambulatory Visit: Payer: Self-pay

## 2020-02-14 ENCOUNTER — Encounter: Payer: Self-pay | Admitting: Licensed Clinical Social Worker

## 2020-02-14 ENCOUNTER — Inpatient Hospital Stay (HOSPITAL_BASED_OUTPATIENT_CLINIC_OR_DEPARTMENT_OTHER): Payer: BC Managed Care – PPO | Admitting: Hematology and Oncology

## 2020-02-14 DIAGNOSIS — J45909 Unspecified asthma, uncomplicated: Secondary | ICD-10-CM | POA: Diagnosis not present

## 2020-02-14 DIAGNOSIS — R432 Parageusia: Secondary | ICD-10-CM | POA: Diagnosis not present

## 2020-02-14 DIAGNOSIS — T451X5A Adverse effect of antineoplastic and immunosuppressive drugs, initial encounter: Secondary | ICD-10-CM | POA: Diagnosis not present

## 2020-02-14 DIAGNOSIS — Z170 Estrogen receptor positive status [ER+]: Secondary | ICD-10-CM

## 2020-02-14 DIAGNOSIS — Z79899 Other long term (current) drug therapy: Secondary | ICD-10-CM | POA: Diagnosis not present

## 2020-02-14 DIAGNOSIS — Z800 Family history of malignant neoplasm of digestive organs: Secondary | ICD-10-CM | POA: Diagnosis not present

## 2020-02-14 DIAGNOSIS — C50411 Malignant neoplasm of upper-outer quadrant of right female breast: Secondary | ICD-10-CM | POA: Diagnosis not present

## 2020-02-14 DIAGNOSIS — B379 Candidiasis, unspecified: Secondary | ICD-10-CM | POA: Diagnosis not present

## 2020-02-14 DIAGNOSIS — D6481 Anemia due to antineoplastic chemotherapy: Secondary | ICD-10-CM | POA: Diagnosis not present

## 2020-02-14 DIAGNOSIS — E119 Type 2 diabetes mellitus without complications: Secondary | ICD-10-CM | POA: Diagnosis not present

## 2020-02-14 DIAGNOSIS — Z5111 Encounter for antineoplastic chemotherapy: Secondary | ICD-10-CM | POA: Diagnosis not present

## 2020-02-14 DIAGNOSIS — Z95828 Presence of other vascular implants and grafts: Secondary | ICD-10-CM

## 2020-02-14 DIAGNOSIS — Z8249 Family history of ischemic heart disease and other diseases of the circulatory system: Secondary | ICD-10-CM | POA: Diagnosis not present

## 2020-02-14 DIAGNOSIS — Z803 Family history of malignant neoplasm of breast: Secondary | ICD-10-CM | POA: Diagnosis not present

## 2020-02-14 DIAGNOSIS — Z5189 Encounter for other specified aftercare: Secondary | ICD-10-CM | POA: Diagnosis not present

## 2020-02-14 LAB — CBC WITH DIFFERENTIAL (CANCER CENTER ONLY)
Abs Immature Granulocytes: 2.21 10*3/uL — ABNORMAL HIGH (ref 0.00–0.07)
Basophils Absolute: 0 10*3/uL (ref 0.0–0.1)
Basophils Relative: 0 %
Eosinophils Absolute: 0.1 10*3/uL (ref 0.0–0.5)
Eosinophils Relative: 1 %
HCT: 32.5 % — ABNORMAL LOW (ref 36.0–46.0)
Hemoglobin: 11 g/dL — ABNORMAL LOW (ref 12.0–15.0)
Immature Granulocytes: 17 %
Lymphocytes Relative: 12 %
Lymphs Abs: 1.5 10*3/uL (ref 0.7–4.0)
MCH: 30 pg (ref 26.0–34.0)
MCHC: 33.8 g/dL (ref 30.0–36.0)
MCV: 88.6 fL (ref 80.0–100.0)
Monocytes Absolute: 1.3 10*3/uL — ABNORMAL HIGH (ref 0.1–1.0)
Monocytes Relative: 10 %
Neutro Abs: 8.1 10*3/uL — ABNORMAL HIGH (ref 1.7–7.7)
Neutrophils Relative %: 60 %
Platelet Count: 231 10*3/uL (ref 150–400)
RBC: 3.67 MIL/uL — ABNORMAL LOW (ref 3.87–5.11)
RDW: 18.1 % — ABNORMAL HIGH (ref 11.5–15.5)
WBC Count: 13.2 10*3/uL — ABNORMAL HIGH (ref 4.0–10.5)
nRBC: 1.1 % — ABNORMAL HIGH (ref 0.0–0.2)

## 2020-02-14 LAB — CMP (CANCER CENTER ONLY)
ALT: 24 U/L (ref 0–44)
AST: 15 U/L (ref 15–41)
Albumin: 3.5 g/dL (ref 3.5–5.0)
Alkaline Phosphatase: 134 U/L — ABNORMAL HIGH (ref 38–126)
Anion gap: 11 (ref 5–15)
BUN: 11 mg/dL (ref 6–20)
CO2: 25 mmol/L (ref 22–32)
Calcium: 9.3 mg/dL (ref 8.9–10.3)
Chloride: 106 mmol/L (ref 98–111)
Creatinine: 0.72 mg/dL (ref 0.44–1.00)
GFR, Estimated: >60 mL/min (ref >60)
Glucose, Bld: 185 mg/dL — ABNORMAL HIGH (ref 70–99)
Potassium: 3.8 mmol/L (ref 3.5–5.1)
Sodium: 142 mmol/L (ref 135–145)
Total Bilirubin: 0.4 mg/dL (ref 0.3–1.2)
Total Protein: 6.8 g/dL (ref 6.5–8.1)

## 2020-02-14 MED ORDER — SODIUM CHLORIDE 0.9 % IV SOLN
10.0000 mg | Freq: Once | INTRAVENOUS | Status: AC
  Administered 2020-02-14: 10 mg via INTRAVENOUS
  Filled 2020-02-14: qty 10

## 2020-02-14 MED ORDER — PALONOSETRON HCL INJECTION 0.25 MG/5ML
0.2500 mg | Freq: Once | INTRAVENOUS | Status: AC
  Administered 2020-02-14: 0.25 mg via INTRAVENOUS

## 2020-02-14 MED ORDER — SODIUM CHLORIDE 0.9% FLUSH
10.0000 mL | Freq: Once | INTRAVENOUS | Status: AC
  Administered 2020-02-14: 10 mL
  Filled 2020-02-14: qty 10

## 2020-02-14 MED ORDER — DOXORUBICIN HCL CHEMO IV INJECTION 2 MG/ML
60.0000 mg/m2 | Freq: Once | INTRAVENOUS | Status: AC
  Administered 2020-02-14: 146 mg via INTRAVENOUS
  Filled 2020-02-14: qty 73

## 2020-02-14 MED ORDER — SODIUM CHLORIDE 0.9 % IV SOLN
Freq: Once | INTRAVENOUS | Status: AC
  Filled 2020-02-14: qty 250

## 2020-02-14 MED ORDER — SODIUM CHLORIDE 0.9% FLUSH
10.0000 mL | INTRAVENOUS | Status: DC | PRN
  Administered 2020-02-14: 10 mL
  Filled 2020-02-14: qty 10

## 2020-02-14 MED ORDER — PALONOSETRON HCL INJECTION 0.25 MG/5ML
INTRAVENOUS | Status: AC
  Filled 2020-02-14: qty 5

## 2020-02-14 MED ORDER — HEPARIN SOD (PORK) LOCK FLUSH 100 UNIT/ML IV SOLN
500.0000 [IU] | Freq: Once | INTRAVENOUS | Status: AC | PRN
  Administered 2020-02-14: 500 [IU]
  Filled 2020-02-14: qty 5

## 2020-02-14 MED ORDER — SODIUM CHLORIDE 0.9 % IV SOLN
600.0000 mg/m2 | Freq: Once | INTRAVENOUS | Status: AC
  Administered 2020-02-14: 1460 mg via INTRAVENOUS
  Filled 2020-02-14: qty 73

## 2020-02-14 MED ORDER — SODIUM CHLORIDE 0.9 % IV SOLN
150.0000 mg | Freq: Once | INTRAVENOUS | Status: AC
  Administered 2020-02-14: 150 mg via INTRAVENOUS
  Filled 2020-02-14: qty 150

## 2020-02-14 NOTE — Patient Instructions (Signed)
[place] Discharge Instructions for Patients Receiving Chemotherapy  Today you received the following chemotherapy agents: doxorubicin/cyclophosphamide.  To help prevent nausea and vomiting after your treatment, we encourage you to take your nausea medication as directed.   If you develop nausea and vomiting that is not controlled by your nausea medication, call the clinic.   BELOW ARE SYMPTOMS THAT SHOULD BE REPORTED IMMEDIATELY:  *FEVER GREATER THAN 100.5 F  *CHILLS WITH OR WITHOUT FEVER  NAUSEA AND VOMITING THAT IS NOT CONTROLLED WITH YOUR NAUSEA MEDICATION  *UNUSUAL SHORTNESS OF BREATH  *UNUSUAL BRUISING OR BLEEDING  TENDERNESS IN MOUTH AND THROAT WITH OR WITHOUT PRESENCE OF ULCERS  *URINARY PROBLEMS  *BOWEL PROBLEMS  UNUSUAL RASH Items with * indicate a potential emergency and should be followed up as soon as possible.  Feel free to call the clinic should you have any questions or concerns. The clinic phone number is [phone].  Please show the [place] at check-in to the Emergency Department and triage nurse.   Implanted [facility] Guide An implanted port is a device that is placed under the skin. It is usually placed in the chest. The device can be used to give IV medicine, to take blood, or for dialysis. You may have an implanted port if:  You need IV medicine that would be irritating to the small veins in your hands or arms.  You need IV medicines, such as antibiotics, for a long period of time.  You need IV nutrition for a long period of time.  You need dialysis. Having a port means that your health care provider will not need to use the veins in your arms for these procedures. You may have fewer limitations when using a port than you would if you used other types of long-term IVs, and you will likely be able to return to normal activities after your incision heals. An implanted port has two main parts:  Reservoir. The reservoir  is the part where a needle is inserted to give medicines or draw blood. The reservoir is round. After it is placed, it appears as a small, raised area under your skin.  Catheter. The catheter is a thin, flexible tube that connects the reservoir to a vein. Medicine that is inserted into the reservoir goes into the catheter and then into the vein. How is my port accessed? To access your port:  A numbing cream may be placed on the skin over the port site.  Your health care provider will put on a mask and sterile gloves.  The skin over your port will be cleaned carefully with a germ-killing soap and allowed to dry.  Your health care provider will gently pinch the port and insert a needle into it.  Your health care provider will check for a blood return to make sure the port is in the vein and is not clogged.  If your port needs to remain accessed to get medicine continuously (constant infusion), your health care provider will place a clear bandage (dressing) over the needle site. The dressing and needle will need to be changed every week, or as told by your health care provider. What is flushing? Flushing helps keep the port from getting clogged. Follow instructions from your health care provider about how and when to flush the port. Ports are usually flushed with saline solution or a medicine called heparin. The need for flushing will depend on how the port is used:  If the port is only  used from time to time to give medicines or draw blood, the port may need to be flushed: ? Before and after medicines have been given. ? Before and after blood has been drawn. ? As part of routine maintenance. Flushing may be recommended every 4-6 weeks.  If a constant infusion is running, the port may not need to be flushed.  Throw away any syringes in a disposal container that is meant for sharp items (sharps container). You can buy a sharps container from a pharmacy, or you can make one by using an empty  hard plastic bottle with a cover. How long will my port stay implanted? The port can stay in for as long as your health care provider thinks it is needed. When it is time for the port to come out, a surgery will be done to remove it. The surgery will be similar to the procedure that was done to put the port in. Follow these instructions at home:   Flush your port as told by your health care provider.  If you need an infusion over several days, follow instructions from your health care provider about how to take care of your port site. Make sure you: ? Wash your hands with soap and water before you change your dressing. If soap and water are not available, use alcohol-based hand sanitizer. ? Change your dressing as told by your health care provider. ? Place any used dressings or infusion bags into a plastic bag. Throw that bag in the trash. ? Keep the dressing that covers the needle clean and dry. Do not get it wet. ? Do not use scissors or sharp objects near the tube. ? Keep the tube clamped, unless it is being used.  Check your port site every day for signs of infection. Check for: ? Redness, swelling, or pain. ? Fluid or blood. ? Pus or a bad smell.  Protect the skin around the port site. ? Avoid wearing bra straps that rub or irritate the site. ? Protect the skin around your port from seat belts. Place a soft pad over your chest if needed.  Bathe or shower as told by your health care provider. The site may get wet as long as you are not actively receiving an infusion.  Return to your normal activities as told by your health care provider. Ask your health care provider what activities are safe for you.  Carry a medical alert card or wear a medical alert bracelet at all times. This will let health care providers know that you have an implanted port in case of an emergency. Get help right away if:  You have redness, swelling, or pain at the port site.  You have fluid or blood coming  from your port site.  You have pus or a bad smell coming from the port site.  You have a fever. Summary  Implanted ports are usually placed in the chest for long-term IV access.  Follow instructions from your health care provider about flushing the port and changing bandages (dressings).  Take care of the area around your port by avoiding clothing that puts pressure on the area, and by watching for signs of infection.  Protect the skin around your port from seat belts. Place a soft pad over your chest if needed.  Get help right away if you have a fever or you have redness, swelling, pain, drainage, or a bad smell at the port site. This information is not intended to replace advice given  to you by your health care provider. Make sure you discuss any questions you have with your health care provider. Document Revised: 06/09/2018 Document Reviewed: 03/20/2016 Elsevier Patient Education  2020 [employer].

## 2020-02-14 NOTE — Progress Notes (Signed)
[facility] CSW Progress Note  [facility] representative met with patient in infusion. Provided third of four [place] installments.  Pt reports doing well overall. She fatigued, but staying positive and has plans to spend time with family over the holidays.  CSW will continue to check-in periodically throughout treatment.    [name] , LCSW

## 2020-02-14 NOTE — Progress Notes (Signed)
Met with patient at registration to obtain signature for [employer].  Patient approved for one-time $1000 [employer] grant to assist with personal expenses while going through treatment. Discussed in detail expenses and how they are covered. She has a copy of the approval letter and expense sheet along with the Outpatient pharmacy information. She received a gas card today from the grant.  She has my card for any additional financial questions or concerns.

## 2020-02-16 ENCOUNTER — Other Ambulatory Visit: Payer: Self-pay

## 2020-02-16 ENCOUNTER — Inpatient Hospital Stay: Payer: BC Managed Care – PPO

## 2020-02-16 VITALS — BP 131/84 | HR 105 | Temp 97.5°F | Resp 20

## 2020-02-16 DIAGNOSIS — Z79899 Other long term (current) drug therapy: Secondary | ICD-10-CM | POA: Diagnosis not present

## 2020-02-16 DIAGNOSIS — Z170 Estrogen receptor positive status [ER+]: Secondary | ICD-10-CM | POA: Diagnosis not present

## 2020-02-16 DIAGNOSIS — D6481 Anemia due to antineoplastic chemotherapy: Secondary | ICD-10-CM | POA: Diagnosis not present

## 2020-02-16 DIAGNOSIS — B379 Candidiasis, unspecified: Secondary | ICD-10-CM | POA: Diagnosis not present

## 2020-02-16 DIAGNOSIS — J45909 Unspecified asthma, uncomplicated: Secondary | ICD-10-CM | POA: Diagnosis not present

## 2020-02-16 DIAGNOSIS — Z5111 Encounter for antineoplastic chemotherapy: Secondary | ICD-10-CM | POA: Diagnosis not present

## 2020-02-16 DIAGNOSIS — R432 Parageusia: Secondary | ICD-10-CM | POA: Diagnosis not present

## 2020-02-16 DIAGNOSIS — Z803 Family history of malignant neoplasm of breast: Secondary | ICD-10-CM | POA: Diagnosis not present

## 2020-02-16 DIAGNOSIS — Z8249 Family history of ischemic heart disease and other diseases of the circulatory system: Secondary | ICD-10-CM | POA: Diagnosis not present

## 2020-02-16 DIAGNOSIS — E119 Type 2 diabetes mellitus without complications: Secondary | ICD-10-CM | POA: Diagnosis not present

## 2020-02-16 DIAGNOSIS — Z5189 Encounter for other specified aftercare: Secondary | ICD-10-CM | POA: Diagnosis not present

## 2020-02-16 DIAGNOSIS — C50411 Malignant neoplasm of upper-outer quadrant of right female breast: Secondary | ICD-10-CM | POA: Diagnosis not present

## 2020-02-16 DIAGNOSIS — Z800 Family history of malignant neoplasm of digestive organs: Secondary | ICD-10-CM | POA: Diagnosis not present

## 2020-02-16 DIAGNOSIS — T451X5A Adverse effect of antineoplastic and immunosuppressive drugs, initial encounter: Secondary | ICD-10-CM | POA: Diagnosis not present

## 2020-02-16 MED ORDER — PEGFILGRASTIM-JMDB 6 MG/0.6ML ~~LOC~~ SOSY
6.0000 mg | PREFILLED_SYRINGE | Freq: Once | SUBCUTANEOUS | Status: AC
  Administered 2020-02-16: 6 mg via SUBCUTANEOUS

## 2020-02-16 NOTE — Patient Instructions (Signed)

## 2020-02-19 ENCOUNTER — Telehealth: Payer: Self-pay | Admitting: Hematology and Oncology

## 2020-02-19 NOTE — Telephone Encounter (Signed)
No 12/16 los, no changes made to pt schedule

## 2020-02-28 ENCOUNTER — Inpatient Hospital Stay: Payer: BC Managed Care – PPO

## 2020-02-28 ENCOUNTER — Other Ambulatory Visit: Payer: Self-pay

## 2020-02-28 ENCOUNTER — Inpatient Hospital Stay (HOSPITAL_BASED_OUTPATIENT_CLINIC_OR_DEPARTMENT_OTHER): Payer: BC Managed Care – PPO | Admitting: Adult Health

## 2020-02-28 VITALS — BP 129/79 | HR 91 | Temp 98.2°F | Resp 18

## 2020-02-28 VITALS — BP 134/74 | HR 97 | Temp 97.6°F | Resp 18 | Ht 66.0 in | Wt 277.8 lb

## 2020-02-28 DIAGNOSIS — T451X5A Adverse effect of antineoplastic and immunosuppressive drugs, initial encounter: Secondary | ICD-10-CM | POA: Diagnosis not present

## 2020-02-28 DIAGNOSIS — Z170 Estrogen receptor positive status [ER+]: Secondary | ICD-10-CM

## 2020-02-28 DIAGNOSIS — Z8249 Family history of ischemic heart disease and other diseases of the circulatory system: Secondary | ICD-10-CM | POA: Diagnosis not present

## 2020-02-28 DIAGNOSIS — B379 Candidiasis, unspecified: Secondary | ICD-10-CM | POA: Diagnosis not present

## 2020-02-28 DIAGNOSIS — Z95828 Presence of other vascular implants and grafts: Secondary | ICD-10-CM

## 2020-02-28 DIAGNOSIS — R432 Parageusia: Secondary | ICD-10-CM | POA: Diagnosis not present

## 2020-02-28 DIAGNOSIS — C50411 Malignant neoplasm of upper-outer quadrant of right female breast: Secondary | ICD-10-CM

## 2020-02-28 DIAGNOSIS — Z79899 Other long term (current) drug therapy: Secondary | ICD-10-CM | POA: Diagnosis not present

## 2020-02-28 DIAGNOSIS — E119 Type 2 diabetes mellitus without complications: Secondary | ICD-10-CM | POA: Diagnosis not present

## 2020-02-28 DIAGNOSIS — Z5189 Encounter for other specified aftercare: Secondary | ICD-10-CM | POA: Diagnosis not present

## 2020-02-28 DIAGNOSIS — D6481 Anemia due to antineoplastic chemotherapy: Secondary | ICD-10-CM | POA: Diagnosis not present

## 2020-02-28 DIAGNOSIS — Z800 Family history of malignant neoplasm of digestive organs: Secondary | ICD-10-CM | POA: Diagnosis not present

## 2020-02-28 DIAGNOSIS — Z1379 Encounter for other screening for genetic and chromosomal anomalies: Secondary | ICD-10-CM | POA: Insufficient documentation

## 2020-02-28 DIAGNOSIS — J45909 Unspecified asthma, uncomplicated: Secondary | ICD-10-CM | POA: Diagnosis not present

## 2020-02-28 DIAGNOSIS — Z5111 Encounter for antineoplastic chemotherapy: Secondary | ICD-10-CM | POA: Diagnosis not present

## 2020-02-28 DIAGNOSIS — Z803 Family history of malignant neoplasm of breast: Secondary | ICD-10-CM | POA: Diagnosis not present

## 2020-02-28 LAB — CMP (CANCER CENTER ONLY)
ALT: 17 U/L (ref 0–44)
AST: 13 U/L — ABNORMAL LOW (ref 15–41)
Albumin: 3.5 g/dL (ref 3.5–5.0)
Alkaline Phosphatase: 114 U/L (ref 38–126)
Anion gap: 7 (ref 5–15)
BUN: 10 mg/dL (ref 6–20)
CO2: 28 mmol/L (ref 22–32)
Calcium: 8.9 mg/dL (ref 8.9–10.3)
Chloride: 105 mmol/L (ref 98–111)
Creatinine: 0.68 mg/dL (ref 0.44–1.00)
GFR, Estimated: >60 mL/min (ref >60)
Glucose, Bld: 141 mg/dL — ABNORMAL HIGH (ref 70–99)
Potassium: 3.8 mmol/L (ref 3.5–5.1)
Sodium: 140 mmol/L (ref 135–145)
Total Bilirubin: 0.4 mg/dL (ref 0.3–1.2)
Total Protein: 6.7 g/dL (ref 6.5–8.1)

## 2020-02-28 LAB — CBC WITH DIFFERENTIAL (CANCER CENTER ONLY)
Abs Immature Granulocytes: 1.51 10*3/uL — ABNORMAL HIGH (ref 0.00–0.07)
Basophils Absolute: 0.1 10*3/uL (ref 0.0–0.1)
Basophils Relative: 1 %
Eosinophils Absolute: 0.1 10*3/uL (ref 0.0–0.5)
Eosinophils Relative: 0 %
HCT: 32.3 % — ABNORMAL LOW (ref 36.0–46.0)
Hemoglobin: 10.8 g/dL — ABNORMAL LOW (ref 12.0–15.0)
Immature Granulocytes: 11 %
Lymphocytes Relative: 10 %
Lymphs Abs: 1.3 10*3/uL (ref 0.7–4.0)
MCH: 30.9 pg (ref 26.0–34.0)
MCHC: 33.4 g/dL (ref 30.0–36.0)
MCV: 92.3 fL (ref 80.0–100.0)
Monocytes Absolute: 1.4 10*3/uL — ABNORMAL HIGH (ref 0.1–1.0)
Monocytes Relative: 11 %
Neutro Abs: 9 10*3/uL — ABNORMAL HIGH (ref 1.7–7.7)
Neutrophils Relative %: 67 %
Platelet Count: 190 10*3/uL (ref 150–400)
RBC: 3.5 MIL/uL — ABNORMAL LOW (ref 3.87–5.11)
RDW: 20.9 % — ABNORMAL HIGH (ref 11.5–15.5)
WBC Count: 13.4 10*3/uL — ABNORMAL HIGH (ref 4.0–10.5)
nRBC: 1.3 % — ABNORMAL HIGH (ref 0.0–0.2)

## 2020-02-28 MED ORDER — CARBOPLATIN CHEMO INJECTION 600 MG/60ML
700.0000 mg | Freq: Once | INTRAVENOUS | Status: AC
Start: 2020-02-28 — End: 2020-02-28
  Administered 2020-02-28: 700 mg via INTRAVENOUS
  Filled 2020-02-28: qty 70

## 2020-02-28 MED ORDER — SODIUM CHLORIDE 0.9 % IV SOLN
150.0000 mg | Freq: Once | INTRAVENOUS | Status: AC
  Administered 2020-02-28: 150 mg via INTRAVENOUS
  Filled 2020-02-28: qty 150

## 2020-02-28 MED ORDER — HEPARIN SOD (PORK) LOCK FLUSH 100 UNIT/ML IV SOLN
500.0000 [IU] | Freq: Once | INTRAVENOUS | Status: AC | PRN
  Administered 2020-02-28: 500 [IU]
  Filled 2020-02-28: qty 5

## 2020-02-28 MED ORDER — PALONOSETRON HCL INJECTION 0.25 MG/5ML
INTRAVENOUS | Status: AC
  Filled 2020-02-28: qty 5

## 2020-02-28 MED ORDER — SODIUM CHLORIDE 0.9 % IV SOLN
10.0000 mg | Freq: Once | INTRAVENOUS | Status: AC
  Administered 2020-02-28: 10 mg via INTRAVENOUS
  Filled 2020-02-28: qty 10

## 2020-02-28 MED ORDER — FAMOTIDINE IN NACL 20-0.9 MG/50ML-% IV SOLN
20.0000 mg | Freq: Once | INTRAVENOUS | Status: AC
  Administered 2020-02-28: 20 mg via INTRAVENOUS

## 2020-02-28 MED ORDER — DIPHENHYDRAMINE HCL 50 MG/ML IJ SOLN
25.0000 mg | Freq: Once | INTRAMUSCULAR | Status: AC
  Administered 2020-02-28: 25 mg via INTRAVENOUS

## 2020-02-28 MED ORDER — DIPHENHYDRAMINE HCL 50 MG/ML IJ SOLN
INTRAMUSCULAR | Status: AC
  Filled 2020-02-28: qty 1

## 2020-02-28 MED ORDER — SODIUM CHLORIDE 0.9 % IV SOLN
Freq: Once | INTRAVENOUS | Status: AC
  Filled 2020-02-28: qty 250

## 2020-02-28 MED ORDER — SODIUM CHLORIDE 0.9% FLUSH
10.0000 mL | INTRAVENOUS | Status: DC | PRN
  Administered 2020-02-28: 10 mL
  Filled 2020-02-28: qty 10

## 2020-02-28 MED ORDER — PALONOSETRON HCL INJECTION 0.25 MG/5ML
0.2500 mg | Freq: Once | INTRAVENOUS | Status: AC
  Administered 2020-02-28: 0.25 mg via INTRAVENOUS

## 2020-02-28 MED ORDER — SODIUM CHLORIDE 0.9 % IV SOLN
80.0000 mg/m2 | Freq: Once | INTRAVENOUS | Status: AC
  Administered 2020-02-28: 192 mg via INTRAVENOUS
  Filled 2020-02-28: qty 32

## 2020-02-28 MED ORDER — FAMOTIDINE IN NACL 20-0.9 MG/50ML-% IV SOLN
INTRAVENOUS | Status: AC
  Filled 2020-02-28: qty 50

## 2020-02-28 MED ORDER — SODIUM CHLORIDE 0.9% FLUSH
10.0000 mL | Freq: Once | INTRAVENOUS | Status: AC
  Administered 2020-02-28: 10 mL
  Filled 2020-02-28: qty 10

## 2020-02-28 NOTE — Progress Notes (Signed)
[facility] Follow up:    Forrest Moron, MD [phone] [address] [place] [address]   DIAGNOSIS: Cancer Staging Malignant neoplasm of upper-outer quadrant of right breast in female, estrogen receptor positive ([place]) Staging form: Breast, AJCC 8th Edition - Clinical stage from 12/19/2019: Stage IIIA (cT2, cN1, cM0, G3, ER+, PR-, HER2-) - Signed by Nicholas Lose, MD on 12/19/2019   SUMMARY OF ONCOLOGIC HISTORY: Oncology History  Malignant neoplasm of upper-outer quadrant of right breast in female, estrogen receptor positive ([place])  12/13/2019 Initial Diagnosis   Screening detected right breast masses 11:30 position: 4.3 cm: Biopsy grade 3 IDC ER 90% PR 0%, KI 40%, HER-2 negative 12 o'clock position: 1.9 cm: Biopsy: Grade 3 IDC ER 40%, PR 0%, HER-2 negative, Ki-67 40%, right axillary lymph node biopsied benign but discordant   12/19/2019 Cancer Staging   Staging form: Breast, AJCC 8th Edition - Clinical stage from 12/19/2019: Stage IIIA (cT2, cN1, cM0, G3, ER+, PR-, HER2-) - Signed by Nicholas Lose, MD on 12/19/2019   01/03/2020 -  Chemotherapy   The patient had dexamethasone (DECADRON) 4 MG tablet, 4 mg (100 % of original dose 4 mg), Oral, Daily, 1 of 1 cycle, Start date: 12/19/2019, End date: -- Dose modification: 4 mg (original dose 4 mg, Cycle 0) DOXOrubicin (ADRIAMYCIN) chemo injection 146 mg, 60 mg/m2 = 146 mg, Intravenous,  Once, 4 of 4 cycles Administration: 146 mg (01/03/2020), 146 mg (01/17/2020), 146 mg (01/31/2020), 146 mg (02/14/2020) palonosetron (ALOXI) injection 0.25 mg, 0.25 mg, Intravenous,  Once, 5 of 8 cycles Administration: 0.25 mg (01/03/2020), 0.25 mg (02/28/2020), 0.25 mg (01/17/2020), 0.25 mg (01/31/2020), 0.25 mg (02/14/2020) pegfilgrastim-jmdb (FULPHILA) injection 6 mg, 6 mg, Subcutaneous,  Once, 4 of 4 cycles Administration: 6 mg (01/05/2020), 6 mg (01/19/2020), 6 mg (02/02/2020), 6 mg (02/16/2020) CARBOplatin (PARAPLATIN)  700 mg in sodium chloride 0.9 % 250 mL chemo infusion, 700 mg (100 % of original dose 700 mg), Intravenous,  Once, 1 of 4 cycles Dose modification: 700 mg (original dose 700 mg, Cycle 5) Administration: 700 mg (02/28/2020) cyclophosphamide (CYTOXAN) 1,460 mg in sodium chloride 0.9 % 250 mL chemo infusion, 600 mg/m2 = 1,460 mg, Intravenous,  Once, 4 of 4 cycles Administration: 1,460 mg (01/03/2020), 1,460 mg (01/17/2020), 1,460 mg (01/31/2020), 1,460 mg (02/14/2020) PACLitaxel (TAXOL) 192 mg in sodium chloride 0.9 % 250 mL chemo infusion (</= 76m/m2), 80 mg/m2 = 192 mg, Intravenous,  Once, 1 of 4 cycles Administration: 192 mg (02/28/2020) fosaprepitant (EMEND) 150 mg in sodium chloride 0.9 % 145 mL IVPB, 150 mg, Intravenous,  Once, 5 of 8 cycles Administration: 150 mg (01/03/2020), 150 mg (02/28/2020), 150 mg (01/17/2020), 150 mg (01/31/2020), 150 mg (02/14/2020)  for chemotherapy treatment.      CURRENT THERAPY: Taxol weekly, Carbo every three weeks  INTERVAL HISTORY: Cathy [id number]. female returns for evaluation prior to receiving her weekly chemotherapy.  She feels well today.  She was anxious last night, and is looking forward to starting and getting through the second portion of her chemotherapy.  She says her tumor feels like it is going down, but perhaps stopped shrinking.  She is otherwise doing well today.    Patient Active Problem List   Diagnosis Date Noted  . Port-A-Cath in place 01/17/2020  . Family history of breast cancer   . Family history of colon cancer   . Family history of liver cancer   . Malignant neoplasm of upper-outer quadrant  of right breast in female, estrogen receptor positive ([place]) 12/19/2019  . B12 deficiency 07/12/2011  . NSAID long-term use-[employer] 07/12/2011  . Weakness 07/11/2011  . Iron deficiency anemia 07/11/2011  . Hypertension     has No Known Allergies.  MEDICAL HISTORY: Past Medical History:  Diagnosis Date  . Asthma   . Breast  cancer ([place]) 11/2019  . Depression   . Diabetes mellitus without complication ([facility])    diet controlled  . Family history of breast cancer   . Family history of colon cancer   . Family history of liver cancer   . Hypertension   . Stroke [facility])    TIA no deficits    SURGICAL HISTORY: Past Surgical History:  Procedure Laterality Date  . BREAST BIOPSY Right   . COLONOSCOPY  07/13/2011   Procedure: COLONOSCOPY;  Surgeon: Gatha Mayer, MD;  Location: [facility];  Service: Endoscopy;  Laterality: N/A;  . DILATION AND CURETTAGE OF UTERUS  abortion  . DILITATION & CURRETTAGE/HYSTROSCOPY WITH NOVASURE ABLATION N/A 09/28/2012   Procedure: DILATATION & CURETTAGE/HYSTEROSCOPY WITH NOVASURE ABLATION; And Resectoscope;  Surgeon: Marvene Staff, MD;  Location: [facility];  Service: Gynecology;  Laterality: N/A;  . ESOPHAGOGASTRODUODENOSCOPY  07/13/2011   Procedure: ESOPHAGOGASTRODUODENOSCOPY (EGD);  Surgeon: Gatha Mayer, MD;  [facility] END[facility]COPY;  Service: Endoscopy;  Laterality: N/A;  . PORTACATH PLACEMENT N/A 01/02/2020   Procedure: INSERTION PORT-A-CATH;  Surgeon: Stark Klein, MD;  Location: [facility];  Service: General;  Laterality: N/A;    SOCIAL HISTORY: Social History   Socioeconomic History  . Marital status: Single    Spouse name: Not on file  . Number of children: Not on file  . Years of education: Not on file  . Highest education level: Not on file  Occupational History  . Not on file  Tobacco Use  . Smoking status: Never Smoker  . Smokeless tobacco: Never Used  Vaping Use  . Vaping Use: Never used  Substance and Sexual Activity  . Alcohol use: Yes    Alcohol/week: 3.0 standard drinks    Types: 3 Cans of beer per week    Comment: twice a week  . Drug use: No  . Sexual activity: Yes    Birth control/protection: Condom  Other Topics Concern  . Not on file  Social History Narrative  . Not on file   Social Determinants of Health   Financial  Resource Strain: Not on file  Food Insecurity: Not on file  Transportation Needs: Not on file  Physical Activity: Not on file  Stress: Not on file  Social Connections: Not on file  Intimate Partner Violence: Not on file    FAMILY HISTORY: Family History  Problem Relation Age of Onset  . Breast cancer Half-Sister 26  . Liver cancer Father        dx early 38s  . Breast cancer Maternal Aunt        dx 44s  . Colon cancer Maternal Aunt        dx 68s  . Cancer Paternal Uncle        unknown type, dx >50    Review of Systems  Constitutional: Positive for fatigue. Negative for appetite change, chills, fever and unexpected weight change.  HENT:   Negative for hearing loss, lump/mass and trouble swallowing.   Eyes: Negative for eye problems and icterus.  Respiratory: Negative for chest tightness, cough and shortness of breath.   Cardiovascular: Negative for chest pain, leg swelling  and palpitations.  Gastrointestinal: Negative for abdominal distention, abdominal pain, constipation, diarrhea, nausea and vomiting.  Endocrine: Negative for hot flashes.  Genitourinary: Negative for difficulty urinating.   Musculoskeletal: Negative for arthralgias.  Skin: Negative for itching and rash.  Neurological: Negative for dizziness, extremity weakness, headaches and numbness.  Hematological: Negative for adenopathy. Does not bruise/bleed easily.  Psychiatric/Behavioral: Negative for depression. The patient is not nervous/anxious.       PHYSICAL EXAMINATION  ECOG PERFORMANCE STATUS: 1 - Symptomatic but completely ambulatory  Vitals:   02/28/20 0837  BP: 134/74  Pulse: 97  Resp: 18  Temp: 97.6 F (36.4 C)  SpO2: 100%    Physical Exam Constitutional:      General: She is not in acute distress.    Appearance: Normal appearance. She is not toxic-appearing.  HENT:     Head: Normocephalic and atraumatic.  Eyes:     General: No scleral icterus. Cardiovascular:     Rate and Rhythm:  Normal rate and regular rhythm.     Pulses: Normal pulses.     Heart sounds: Normal heart sounds.  Pulmonary:     Effort: Pulmonary effort is normal.     Breath sounds: Normal breath sounds.  Abdominal:     General: Abdomen is flat. Bowel sounds are normal. There is no distension.     Palpations: Abdomen is soft.     Tenderness: There is no abdominal tenderness.  Musculoskeletal:        General: No swelling.     Cervical back: Neck supple.  Lymphadenopathy:     Cervical: No cervical adenopathy.  Skin:    General: Skin is warm and dry.     Findings: No rash.  Neurological:     General: No focal deficit present.     Mental Status: She is alert.  Psychiatric:        Mood and Affect: Mood normal.        Behavior: Behavior normal.     LABORATORY DATA:  CBC    Component Value Date/Time   WBC 13.4 (H) 02/28/2020 0824   WBC 7.4 09/26/2012 1520   RBC 3.50 (L) 02/28/2020 0824   HGB 10.8 (L) 02/28/2020 0824   HGB 14.0 09/04/2018 1454   HCT 32.3 (L) 02/28/2020 0824   HCT 42.5 09/04/2018 1454   PLT 190 02/28/2020 0824   PLT 347 09/04/2018 1454   MCV 92.3 02/28/2020 0824   MCV 89 09/04/2018 1454   MCH 30.9 02/28/2020 0824   MCHC 33.4 02/28/2020 0824   RDW 20.9 (H) 02/28/2020 0824   RDW 14.6 09/04/2018 1454   LYMPHSABS 1.3 02/28/2020 0824   MONOABS 1.4 (H) 02/28/2020 0824   EOSABS 0.1 02/28/2020 0824   BASOSABS 0.1 02/28/2020 0824    CMP     Component Value Date/Time   NA 140 02/28/2020 0824   NA 139 06/20/2019 1716   K 3.8 02/28/2020 0824   CL 105 02/28/2020 0824   CO2 28 02/28/2020 0824   GLUCOSE 141 (H) 02/28/2020 0824   BUN 10 02/28/2020 0824   BUN 13 06/20/2019 1716   CREATININE 0.68 02/28/2020 0824   CALCIUM 8.9 02/28/2020 0824   PROT 6.7 02/28/2020 0824   PROT 7.4 06/20/2019 1716   ALBUMIN 3.5 02/28/2020 0824   ALBUMIN 4.6 06/20/2019 1716   AST 13 (L) 02/28/2020 0824   ALT 17 02/28/2020 0824   ALKPHOS 114 02/28/2020 0824   BILITOT 0.4 02/28/2020 0824    GFRNONAA >60 02/28/2020 9244  GFRAA 117 06/20/2019 1716           ASSESSMENT and THERAPY PLAN:   Malignant neoplasm of upper-outer quadrant of right breast in female, estrogen receptor positive ([place]) 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%, right axillary lymph node biopsied benign but discordant T2N?M0 stage IIa versus stage IIIa (based on the lymph node)  Treatment plan: 1.Neoadjuvant chemotherapy with dose dense Adriamycin and Cytoxan followed by Taxol and Carbo 2.depending on the response breast conserving surgery versus mastectomy 3.Adjuvant radiation 4.Followed by adjuvant antiestrogen therapy with abemaciclib ----------------------------------------------------------------------------------------------------------------------------------------------------- Current treatment: Cycle 1 Taxol/carbo Echocardiogram 12/27/2019: EF 60 to 65%  Chemo Toxicities: 1.  Mild nausea: Anti-emetics 2. monitoring for chemo related toxicities and blood counts. 3. Dysgeusia: Keeping up with oral intake  I have ordered an ultrasound of her breast to establish a new baseline for her breast cancer.    To return in one week for lab, f/u, and her weekly Taxol.   All questions were answered. The patient knows to call the clinic with any problems, questions or concerns. We can certainly see the patient much sooner if necessary.  Total encounter time: 30 minutes*  Wilber Bihari, NP 03/02/20 6:10 PM Medical Oncology and Hematology [facility] [place], [place] [address] Tel. [phone]    Fax. [phone]  *Total Encounter Time as defined by the Centers for Medicare and Medicaid Services includes, in addition to the face-to-face time of a patient visit  (documented in the note above) non-face-to-face time: obtaining and reviewing outside history, ordering and reviewing medications, tests or procedures, care coordination (communications with other health care professionals or caregivers) and documentation in the medical record.

## 2020-02-28 NOTE — Patient Instructions (Addendum)
[facility] Discharge Instructions for Patients Receiving Chemotherapy  Today you received the following chemotherapy agents: paclitaxel, carboplatin  To help prevent nausea and vomiting after your treatment, we encourage you to take your nausea medication as prescribed by your physician.    If you develop nausea and vomiting that is not controlled by your nausea medication, call the clinic.   BELOW ARE SYMPTOMS THAT SHOULD BE REPORTED IMMEDIATELY:  *FEVER GREATER THAN 100.5 F  *CHILLS WITH OR WITHOUT FEVER  NAUSEA AND VOMITING THAT IS NOT CONTROLLED WITH YOUR NAUSEA MEDICATION  *UNUSUAL SHORTNESS OF BREATH  *UNUSUAL BRUISING OR BLEEDING  TENDERNESS IN MOUTH AND THROAT WITH OR WITHOUT PRESENCE OF ULCERS  *URINARY PROBLEMS  *BOWEL PROBLEMS  UNUSUAL RASH Items with * indicate a potential emergency and should be followed up as soon as possible.  Feel free to call the clinic should you have any questions or concerns. The clinic phone number is [phone].  Please show the CHEMO ALERT CARD at check-in to the Emergency Department and triage nurse.  Paclitaxel injection What is this medicine? PACLITAXEL (PAK li TAX el) is a chemotherapy drug. It targets fast dividing cells, like cancer cells, and causes these cells to die. This medicine is used to treat ovarian cancer, breast cancer, lung cancer, Kaposi's sarcoma, and other cancers. This medicine may be used for other purposes; ask your health care provider or pharmacist if you have questions. COMMON BRAND NAME(S): Onxol, Taxol What should I tell my health care provider before I take this medicine? They need to know if you have any of these conditions:  history of irregular heartbeat  liver disease  low blood counts, like low white cell, platelet, or red cell counts  lung or breathing disease, like asthma  tingling of the fingers or toes, or other nerve disorder  an unusual or allergic reaction to  paclitaxel, alcohol, polyoxyethylated castor oil, other chemotherapy, other medicines, foods, dyes, or preservatives  pregnant or trying to get pregnant  breast-feeding How should I use this medicine? This drug is given as an infusion into a vein. It is administered in a hospital or clinic by a specially trained health care professional. Talk to your pediatrician regarding the use of this medicine in children. Special care may be needed. Overdosage: If you think you have taken too much of this medicine contact a poison control center or emergency room at once. NOTE: This medicine is only for you. Do not share this medicine with others. What if I miss a dose? It is important not to miss your dose. Call your doctor or health care professional if you are unable to keep an appointment. What may interact with this medicine? Do not take this medicine with any of the following medications:  disulfiram  metronidazole This medicine may also interact with the following medications:  antiviral medicines for hepatitis, HIV or AIDS  certain antibiotics like erythromycin and clarithromycin  certain medicines for fungal infections like ketoconazole and itraconazole  certain medicines for seizures like carbamazepine, phenobarbital, phenytoin  gemfibrozil  nefazodone  rifampin  St. John's wort This list may not describe all possible interactions. Give your health care provider a list of all the medicines, herbs, non-prescription drugs, or dietary supplements you use. Also tell them if you smoke, drink alcohol, or use illegal drugs. Some items may interact with your medicine. What should I watch for while using this medicine? Your condition will be monitored carefully while you are receiving this medicine. You   will need important blood work done while you are taking this medicine. This medicine can cause serious allergic reactions. To reduce your risk you will need to take other medicine(s)  before treatment with this medicine. If you experience allergic reactions like skin rash, itching or hives, swelling of the face, lips, or tongue, tell your doctor or health care professional right away. In some cases, you may be given additional medicines to help with side effects. Follow all directions for their use. This drug may make you feel generally unwell. This is not uncommon, as chemotherapy can affect healthy cells as well as cancer cells. Report any side effects. Continue your course of treatment even though you feel ill unless your doctor tells you to stop. Call your doctor or health care professional for advice if you get a fever, chills or sore throat, or other symptoms of a cold or flu. Do not treat yourself. This drug decreases your body's ability to fight infections. Try to avoid being around people who are sick. This medicine may increase your risk to bruise or bleed. Call your doctor or health care professional if you notice any unusual bleeding. Be careful brushing and flossing your teeth or using a toothpick because you may get an infection or bleed more easily. If you have any dental work done, tell your dentist you are receiving this medicine. Avoid taking products that contain aspirin, acetaminophen, ibuprofen, naproxen, or ketoprofen unless instructed by your doctor. These medicines may hide a fever. Do not become pregnant while taking this medicine. Women should inform their doctor if they wish to become pregnant or think they might be pregnant. There is a potential for serious side effects to an unborn child. Talk to your health care professional or pharmacist for more information. Do not breast-feed an infant while taking this medicine. Men are advised not to father a child while receiving this medicine. This product may contain alcohol. Ask your pharmacist or healthcare provider if this medicine contains alcohol. Be sure to tell all healthcare providers you are taking this  medicine. Certain medicines, like metronidazole and disulfiram, can cause an unpleasant reaction when taken with alcohol. The reaction includes flushing, headache, nausea, vomiting, sweating, and increased thirst. The reaction can last from 30 minutes to several hours. What side effects may I notice from receiving this medicine? Side effects that you should report to your doctor or health care professional as soon as possible:  allergic reactions like skin rash, itching or hives, swelling of the face, lips, or tongue  breathing problems  changes in vision  fast, irregular heartbeat  high or low blood pressure  mouth sores  pain, tingling, numbness in the hands or feet  signs of decreased platelets or bleeding - bruising, pinpoint red spots on the skin, black, tarry stools, blood in the urine  signs of decreased red blood cells - unusually weak or tired, feeling faint or lightheaded, falls  signs of infection - fever or chills, cough, sore throat, pain or difficulty passing urine  signs and symptoms of liver injury like dark yellow or brown urine; general ill feeling or flu-like symptoms; light-colored stools; loss of appetite; nausea; right upper belly pain; unusually weak or tired; yellowing of the eyes or skin  swelling of the ankles, feet, hands  unusually slow heartbeat Side effects that usually do not require medical attention (report to your doctor or health care professional if they continue or are bothersome):  diarrhea  hair loss  loss of appetite    muscle or joint pain  nausea, vomiting  pain, redness, or irritation at site where injected  tiredness This list may not describe all possible side effects. Call your doctor for medical advice about side effects. You may report side effects to FDA at 1-800-FDA-1088. Where should I keep my medicine? This drug is given in a hospital or clinic and will not be stored at home. NOTE: This sheet is a summary. It may not  cover all possible information. If you have questions about this medicine, talk to your doctor, pharmacist, or health care provider.  2020 Elsevier/Gold Standard (2016-10-19 13:14:55)  Carboplatin injection What is this medicine? CARBOPLATIN (KAR boe pla tin) is a chemotherapy drug. It targets fast dividing cells, like cancer cells, and causes these cells to die. This medicine is used to treat ovarian cancer and many other cancers. This medicine may be used for other purposes; ask your health care provider or pharmacist if you have questions. COMMON BRAND NAME(S): Paraplatin What should I tell my health care provider before I take this medicine? They need to know if you have any of these conditions:  blood disorders  hearing problems  kidney disease  recent or ongoing radiation therapy  an unusual or allergic reaction to carboplatin, cisplatin, other chemotherapy, other medicines, foods, dyes, or preservatives  pregnant or trying to get pregnant  breast-feeding How should I use this medicine? This drug is usually given as an infusion into a vein. It is administered in a hospital or clinic by a specially trained health care professional. Talk to your pediatrician regarding the use of this medicine in children. Special care may be needed. Overdosage: If you think you have taken too much of this medicine contact a poison control center or emergency room at once. NOTE: This medicine is only for you. Do not share this medicine with others. What if I miss a dose? It is important not to miss a dose. Call your doctor or health care professional if you are unable to keep an appointment. What may interact with this medicine?  medicines for seizures  medicines to increase blood counts like filgrastim, pegfilgrastim, sargramostim  some antibiotics like amikacin, gentamicin, neomycin, streptomycin, tobramycin  vaccines Talk to your doctor or health care professional before taking any of  these medicines:  acetaminophen  aspirin  ibuprofen  ketoprofen  naproxen This list may not describe all possible interactions. Give your health care provider a list of all the medicines, herbs, non-prescription drugs, or dietary supplements you use. Also tell them if you smoke, drink alcohol, or use illegal drugs. Some items may interact with your medicine. What should I watch for while using this medicine? Your condition will be monitored carefully while you are receiving this medicine. You will need important blood work done while you are taking this medicine. This drug may make you feel generally unwell. This is not uncommon, as chemotherapy can affect healthy cells as well as cancer cells. Report any side effects. Continue your course of treatment even though you feel ill unless your doctor tells you to stop. In some cases, you may be given additional medicines to help with side effects. Follow all directions for their use. Call your doctor or health care professional for advice if you get a fever, chills or sore throat, or other symptoms of a cold or flu. Do not treat yourself. This drug decreases your body's ability to fight infections. Try to avoid being around people who are sick. This medicine may increase your   risk to bruise or bleed. Call your doctor or health care professional if you notice any unusual bleeding. Be careful brushing and flossing your teeth or using a toothpick because you may get an infection or bleed more easily. If you have any dental work done, tell your dentist you are receiving this medicine. Avoid taking products that contain aspirin, acetaminophen, ibuprofen, naproxen, or ketoprofen unless instructed by your doctor. These medicines may hide a fever. Do not become pregnant while taking this medicine. Women should inform their doctor if they wish to become pregnant or think they might be pregnant. There is a potential for serious side effects to an unborn child.  Talk to your health care professional or pharmacist for more information. Do not breast-feed an infant while taking this medicine. What side effects may I notice from receiving this medicine? Side effects that you should report to your doctor or health care professional as soon as possible:  allergic reactions like skin rash, itching or hives, swelling of the face, lips, or tongue  signs of infection - fever or chills, cough, sore throat, pain or difficulty passing urine  signs of decreased platelets or bleeding - bruising, pinpoint red spots on the skin, black, tarry stools, nosebleeds  signs of decreased red blood cells - unusually weak or tired, fainting spells, lightheadedness  breathing problems  changes in hearing  changes in vision  chest pain  high blood pressure  low blood counts - This drug may decrease the number of white blood cells, red blood cells and platelets. You may be at increased risk for infections and bleeding.  nausea and vomiting  pain, swelling, redness or irritation at the injection site  pain, tingling, numbness in the hands or feet  problems with balance, talking, walking  trouble passing urine or change in the amount of urine Side effects that usually do not require medical attention (report to your doctor or health care professional if they continue or are bothersome):  hair loss  loss of appetite  metallic taste in the mouth or changes in taste This list may not describe all possible side effects. Call your doctor for medical advice about side effects. You may report side effects to FDA at 1-800-FDA-1088. Where should I keep my medicine? This drug is given in a hospital or clinic and will not be stored at home. NOTE: This sheet is a summary. It may not cover all possible information. If you have questions about this medicine, talk to your doctor, pharmacist, or health care provider.  2020 Elsevier/Gold Standard (2007-05-23 14:38:05)   

## 2020-02-28 NOTE — Assessment & Plan Note (Addendum)
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%, right axillary lymph node biopsied benign but discordant T2N?M0 stage IIa versus stage IIIa (based on the lymph node)  Treatment plan: 1.Neoadjuvant chemotherapy with dose dense Adriamycin and Cytoxan followed by Taxol and Carbo 2.depending on the response breast conserving surgery versus mastectomy 3.Adjuvant radiation 4.Followed by adjuvant antiestrogen therapy with abemaciclib ----------------------------------------------------------------------------------------------------------------------------------------------------- Current treatment: Cycle 1 Taxol/carbo Echocardiogram 12/27/2019: EF 60 to 65%  Chemo Toxicities: 1.  Mild nausea: Anti-emetics 2. monitoring for chemo related toxicities and blood counts. 3. Dysgeusia: Keeping up with oral intake  I have ordered an ultrasound of her breast to establish a new baseline for her breast cancer.    To return in one week for lab, f/u, and her weekly Taxol.

## 2020-03-02 ENCOUNTER — Encounter: Payer: Self-pay | Admitting: Adult Health

## 2020-03-03 ENCOUNTER — Telehealth: Payer: Self-pay | Admitting: Genetic Counselor

## 2020-03-03 ENCOUNTER — Telehealth: Payer: Self-pay | Admitting: Adult Health

## 2020-03-03 ENCOUNTER — Encounter: Payer: Self-pay | Admitting: Genetic Counselor

## 2020-03-03 ENCOUNTER — Ambulatory Visit: Payer: Self-pay | Admitting: Genetic Counselor

## 2020-03-03 DIAGNOSIS — Z1379 Encounter for other screening for genetic and chromosomal anomalies: Secondary | ICD-10-CM

## 2020-03-03 NOTE — Progress Notes (Signed)
HPI:  Cathy Parrish was previously seen in the [place] clinic due to a personal and family history of cancer and concerns regarding a hereditary predisposition to cancer. Please refer to our prior cancer genetics clinic note for more information regarding our discussion, assessment and recommendations, at the time. Cathy Parrish recent genetic test results were disclosed to her, as were recommendations warranted by these results. These results and recommendations are discussed in more detail below.  CANCER HISTORY:  Oncology History  Malignant neoplasm of upper-outer quadrant of right breast in female, estrogen receptor positive ([place])  12/13/2019 Initial Diagnosis   Screening detected right breast masses 11:30 position: 4.3 cm: Biopsy grade 3 IDC ER 90% PR 0%, KI 40%, HER-2 negative 12 o'clock position: 1.9 cm: Biopsy: Grade 3 IDC ER 40%, PR 0%, HER-2 negative, Ki-67 40%, right axillary lymph node biopsied benign but discordant   12/19/2019 Cancer Staging   Staging form: Breast, AJCC 8th Edition - Clinical stage from 12/19/2019: Stage IIIA (cT2, cN1, cM0, G3, ER+, PR-, HER2-) - Signed by Nicholas Lose, MD on 12/19/2019   01/03/2020 -  Chemotherapy   The patient had dexamethasone (DECADRON) 4 MG tablet, 4 mg (100 % of original dose 4 mg), Oral, Daily, 1 of 1 cycle, Start date: 12/19/2019, End date: -- Dose modification: 4 mg (original dose 4 mg, Cycle 0) DOXOrubicin (ADRIAMYCIN) chemo injection 146 mg, 60 mg/m2 = 146 mg, Intravenous,  Once, 4 of 4 cycles Administration: 146 mg (01/03/2020), 146 mg (01/17/2020), 146 mg (01/31/2020), 146 mg (02/14/2020) palonosetron (ALOXI) injection 0.25 mg, 0.25 mg, Intravenous,  Once, 5 of 8 cycles Administration: 0.25 mg (01/03/2020), 0.25 mg (02/28/2020), 0.25 mg (01/17/2020), 0.25 mg (01/31/2020), 0.25 mg (02/14/2020) pegfilgrastim-jmdb (FULPHILA) injection 6 mg, 6 mg, Subcutaneous,  Once, 4 of 4 cycles Administration: 6 mg (01/05/2020), 6 mg  (01/19/2020), 6 mg (02/02/2020), 6 mg (02/16/2020) CARBOplatin (PARAPLATIN) 700 mg in sodium chloride 0.9 % 250 mL chemo infusion, 700 mg (100 % of original dose 700 mg), Intravenous,  Once, 1 of 4 cycles Dose modification: 700 mg (original dose 700 mg, Cycle 5) Administration: 700 mg (02/28/2020) cyclophosphamide (CYTOXAN) 1,460 mg in sodium chloride 0.9 % 250 mL chemo infusion, 600 mg/m2 = 1,460 mg, Intravenous,  Once, 4 of 4 cycles Administration: 1,460 mg (01/03/2020), 1,460 mg (01/17/2020), 1,460 mg (01/31/2020), 1,460 mg (02/14/2020) PACLitaxel (TAXOL) 192 mg in sodium chloride 0.9 % 250 mL chemo infusion (</= 66m/m2), 80 mg/m2 = 192 mg, Intravenous,  Once, 1 of 4 cycles Administration: 192 mg (02/28/2020) fosaprepitant (EMEND) 150 mg in sodium chloride 0.9 % 145 mL IVPB, 150 mg, Intravenous,  Once, 5 of 8 cycles Administration: 150 mg (01/03/2020), 150 mg (02/28/2020), 150 mg (01/17/2020), 150 mg (01/31/2020), 150 mg (02/14/2020)  for chemotherapy treatment.    02/28/2020 Genetic Testing   Negative genetic testing:  No pathogenic variants detected on the [facility] CustomNext-Cancer + RNAinsight panel. The report date is 02/28/2020.   The CustomNext-Cancer+RNAinsight panel offered by [facility] sequencing and rearrangement analysis for the following 47 genes:  APC, ATM, AXIN2, BARD1, BMPR1A, BRCA1, BRCA2, BRIP1, CDH1, CDK4, CDKN2A, CHEK2, DICER1, EPCAM, GREM1, HOXB13, MEN1, MLH1, MSH2, MSH3, MSH6, MUTYH, NBN, NF1, NF2, NTHL1, PALB2, PMS2, POLD1, POLE, PTEN, RAD51C, RAD51D, RECQL, RET, SDHA, SDHAF2, SDHB, SDHC, SDHD, SMAD4, SMARCA4, STK11, TP53, TSC1, TSC2, and VHL.  RNA data is routinely analyzed for use in variant interpretation for all genes.     FAMILY HISTORY:  We obtained a detailed, 4-generation family history.  Significant diagnoses are listed below: Family History  Problem Relation Age of Onset  . Breast cancer Half-Sister 86  . Liver cancer Father        dx early 26s  .  Breast cancer Maternal Aunt        dx 76s  . Colon cancer Maternal Aunt        dx 66s  . Cancer Paternal Uncle        unknown type, dx >50    Cathy Parrish has one son (age 28). She has two maternal half-sisters, two paternal half-sisters, and three paternal half-brothers. One of her maternal half-sisters has a history of breast cancer diagnosed at the age of 87.   Cathy Parrish mother died at the age of 78 and did not have cancer. Cathy Parrish had two maternal aunts. One aunt had breast cancer and colon cancer diagnosed in her 69s. Her maternal grandparents died older than 69.   Cathy Parrish father died at the age of 46 from liver cancer that was diagnosed in his early 28s. She had two paternal uncles. One uncle had cancer (unknown type) diagnosed older than 97. Her paternal grandparents both died older than 62.   Cathy Parrish is unaware of previous family history of genetic testing for hereditary cancer risks. Patient's ancestors are of unknown descent. There is no reported Ashkenazi Jewish ancestry. There is no known consanguinity.  GENETIC TEST RESULTS: Genetic testing reported out on 02/28/2020 through the [facility]Next-Cancer + RNAinsight panel. No pathogenic variants were detected.   The CustomNext-Cancer+RNAinsight panel offered by [facility] includes sequencing and rearrangement analysis for the following 47 genes:  APC, ATM, AXIN2, BARD1, BMPR1A, BRCA1, BRCA2, BRIP1, CDH1, CDK4, CDKN2A, CHEK2, DICER1, EPCAM, GREM1, HOXB13, MEN1, MLH1, MSH2, MSH3, MSH6, MUTYH, NBN, NF1, NF2, NTHL1, PALB2, PMS2, POLD1, POLE, PTEN, RAD51C, RAD51D, RECQL, RET, SDHA, SDHAF2, SDHB, SDHC, SDHD, SMAD4, SMARCA4, STK11, TP53, TSC1, TSC2, and VHL.  RNA data is routinely analyzed for use in variant interpretation for all genes. The test report will be scanned into EPIC and located under the Molecular Pathology section of the Results Review tab.  A portion of the result report is included below for reference.     We  discussed with Cathy Parrish that because current genetic testing is not perfect, it is possible there may be a gene mutation in one of these genes that current testing cannot detect, but that chance is small.  We also discussed that there could be another gene that has not yet been discovered, or that we have not yet tested, that is responsible for the cancer diagnoses in the family. It is also possible there is a hereditary cause for the cancer in the family that Cathy Parrish did not inherit and therefore was not identified in her testing.  Therefore, it is important to remain in touch with cancer genetics in the future so that we can continue to offer Cathy Parrish the most up to date genetic testing.   CANCER SCREENING RECOMMENDATIONS: Cathy Parrish test result is considered negative (normal).  This means that we have not identified a hereditary cause for her personal and family history of cancer at this time. While reassuring, this does not definitively rule out a hereditary predisposition to cancer. It is still possible that there could be genetic mutations that are undetectable by current technology. There could be genetic mutations in genes that have not been tested or identified to increase cancer risk.  Therefore, it is recommended she continue  to follow the cancer management and screening guidelines provided by her oncology and primary healthcare provider.   An individual's cancer risk and medical management are not determined by genetic test results alone. Overall cancer risk assessment incorporates additional factors, including personal medical history, family history, and any available genetic information that may result in a personalized plan for cancer prevention and surveillance.  RECOMMENDATIONS FOR FAMILY MEMBERS:  Individuals in this family might be at some increased risk of developing cancer, over the general population risk, simply due to the family history of cancer.  We recommended women in this  family have a yearly mammogram beginning at age 39, or 58 years younger than the earliest onset of cancer, an annual clinical breast exam, and perform monthly breast self-exams. Women in this family should also have a gynecological exam as recommended by their primary provider. All family members should be referred for colonoscopy starting at age 13.  It is also possible there is a hereditary cause for the cancer in Cathy Parrish's family that she did not inherit and therefore was not identified in her.  Based on Cathy Parrish's family history, we recommended her maternal half-sister, who was diagnosed with breast cancer at age 64, have genetic counseling and testing. Cathy Parrish will let us know if we can be of any assistance in coordinating genetic counseling and/or testing for this family member.   FOLLOW-UP: Lastly, we discussed with Cathy Parrish that cancer genetics is a rapidly advancing field and it is possible that new genetic tests will be appropriate for her and/or her family members in the future. We encouraged her to remain in contact with cancer genetics on an annual basis so we can update her personal and family histories and let her know of advances in cancer genetics that may benefit this family.   Our contact number was provided. Cathy Parrish questions were answered to her satisfaction, and she knows she is welcome to call us at anytime with additional questions or concerns.   Clint Guy, MS, [facility] Genetic Counselor [email or web address] Phone: [phone]

## 2020-03-03 NOTE — Telephone Encounter (Signed)
Revealed negative genetic testing. Discussed that we do not know why she has breast cancer or why there is cancer in the family. There could be a genetic mutation in the family that Cathy Parrish did not inherit. There could also be a mutation in a different gene that we are not testing, or our current technology may not be able detect certain mutations. It will therefore be important for her to stay in contact with genetics to keep up with whether additional testing may be appropriate in the future.

## 2020-03-03 NOTE — Telephone Encounter (Signed)
No 12/30 los, no changes made to pt schedule  

## 2020-03-05 ENCOUNTER — Other Ambulatory Visit: Payer: Self-pay | Admitting: *Deleted

## 2020-03-05 DIAGNOSIS — C50411 Malignant neoplasm of upper-outer quadrant of right female breast: Secondary | ICD-10-CM

## 2020-03-05 DIAGNOSIS — Z170 Estrogen receptor positive status [ER+]: Secondary | ICD-10-CM

## 2020-03-05 NOTE — Progress Notes (Signed)
Received call from pt with complaint of left upper arm swelling, pain, and numbness x 2 days.  Pt denies recent injury, trauma, redness or warmth.  Per MD pt needing Vas [place] of left arm to r/o DVT.  Orders placed and apt scheduled. Pt verbalized understanding of apt date and time.

## 2020-03-05 NOTE — Progress Notes (Addendum)
Patient Care Team: Cathy Moron, MD as PCP - General (Internal Medicine) Cathy Germany, RN as Oncology Nurse Navigator Cathy Lose, MD as Consulting Physician (Hematology and Oncology)  DIAGNOSIS:    ICD-10-CM   1. Malignant neoplasm of upper-outer quadrant of right breast in female, estrogen receptor positive ([place])  C50.411    Z17.0     SUMMARY OF ONCOLOGIC HISTORY: Oncology History  Malignant neoplasm of upper-outer quadrant of right breast in female, estrogen receptor positive ([place])  12/13/2019 Initial Diagnosis   Screening detected right breast masses 11:30 position: 4.3 cm: Biopsy grade 3 IDC ER 90% PR 0%, KI 40%, HER-2 negative 12 o'clock position: 1.9 cm: Biopsy: Grade 3 IDC ER 40%, PR 0%, HER-2 negative, Ki-67 40%, right axillary lymph node biopsied benign but discordant   12/19/2019 Cancer Staging   Staging form: Breast, AJCC 8th Edition - Clinical stage from 12/19/2019: Stage IIIA (cT2, cN1, cM0, G3, ER+, PR-, HER2-) - Signed by Cathy Lose, MD on 12/19/2019   01/03/2020 -  Chemotherapy   The patient had dexamethasone (DECADRON) 4 MG tablet, 4 mg (100 % of original dose 4 mg), Oral, Daily, 1 of 1 cycle, Start date: 12/19/2019, End date: -- Dose modification: 4 mg (original dose 4 mg, Cycle 0) DOXOrubicin (ADRIAMYCIN) chemo injection 146 mg, 60 mg/m2 = 146 mg, Intravenous,  Once, 4 of 4 cycles Administration: 146 mg (01/03/2020), 146 mg (01/17/2020), 146 mg (01/31/2020), 146 mg (02/14/2020) palonosetron (ALOXI) injection 0.25 mg, 0.25 mg, Intravenous,  Once, 5 of 8 cycles Administration: 0.25 mg (01/03/2020), 0.25 mg (02/28/2020), 0.25 mg (01/17/2020), 0.25 mg (01/31/2020), 0.25 mg (02/14/2020) pegfilgrastim-jmdb (FULPHILA) injection 6 mg, 6 mg, Subcutaneous,  Once, 4 of 4 cycles Administration: 6 mg (01/05/2020), 6 mg (01/19/2020), 6 mg (02/02/2020), 6 mg (02/16/2020) CARBOplatin (PARAPLATIN) 700 mg in sodium chloride 0.9 % 250 mL chemo infusion, 700 mg (100 % of  original dose 700 mg), Intravenous,  Once, 1 of 4 cycles Dose modification: 700 mg (original dose 700 mg, Cycle 5) Administration: 700 mg (02/28/2020) cyclophosphamide (CYTOXAN) 1,460 mg in sodium chloride 0.9 % 250 mL chemo infusion, 600 mg/m2 = 1,460 mg, Intravenous,  Once, 4 of 4 cycles Administration: 1,460 mg (01/03/2020), 1,460 mg (01/17/2020), 1,460 mg (01/31/2020), 1,460 mg (02/14/2020) PACLitaxel (TAXOL) 192 mg in sodium chloride 0.9 % 250 mL chemo infusion (</= 69m/m2), 80 mg/m2 = 192 mg, Intravenous,  Once, 1 of 4 cycles Administration: 192 mg (02/28/2020) fosaprepitant (EMEND) 150 mg in sodium chloride 0.9 % 145 mL IVPB, 150 mg, Intravenous,  Once, 5 of 8 cycles Administration: 150 mg (01/03/2020), 150 mg (02/28/2020), 150 mg (01/17/2020), 150 mg (01/31/2020), 150 mg (02/14/2020)  for chemotherapy treatment.    02/28/2020 Genetic Testing   Negative genetic testing:  No pathogenic variants detected on the [facility] CustomNext-Cancer + RNAinsight panel. The report date is 02/28/2020.   The CustomNext-Cancer+RNAinsight panel offered by [facility] sequencing and rearrangement analysis for the following 47 genes:  APC, ATM, AXIN2, BARD1, BMPR1A, BRCA1, BRCA2, BRIP1, CDH1, CDK4, CDKN2A, CHEK2, DICER1, EPCAM, GREM1, HOXB13, MEN1, MLH1, MSH2, MSH3, MSH6, MUTYH, NBN, NF1, NF2, NTHL1, PALB2, PMS2, POLD1, POLE, PTEN, RAD51C, RAD51D, RECQL, RET, SDHA, SDHAF2, SDHB, SDHC, SDHD, SMAD4, SMARCA4, STK11, TP53, TSC1, TSC2, and VHL.  RNA data is routinely analyzed for use in variant interpretation for all genes.     CHIEF COMPLIANT: Cycle2 Taxol  INTERVAL HISTORY: TLirio Parrish a 50y.o. with above-mentioned history of rightbreast cancercurrently on neoadjuvant chemotherapy with weekly  Taxol and Carboplatin every 3 weeks. She presents to the clinic todayfor a toxicity checkandtreatment.  Her major complaint is severe pain in the left arm that started 2 to 3 days ago.  It is accompanied  by swelling.  She started crying in our office today.  She took ibuprofen which has helped her but she wanted to discuss with [place] about ideal pain medication for this intense pain.  Sometimes gets markedly worse.  ALLERGIES:  has No Known Allergies.  MEDICATIONS:  Current Outpatient Medications  Medication Sig Dispense Refill   albuterol (PROVENTIL HFA;VENTOLIN HFA) 108 (90 BASE) MCG/ACT inhaler Inhale 2 puffs into the lungs every 4 (four) hours as needed for wheezing. 1 Inhaler 0   ALPRAZolam (XANAX) 0.5 MG tablet TAKE 1 TABLET(0.5 MG) BY MOUTH AT BEDTIME AS NEEDED FOR ANXIETY 30 tablet 0   Aspirin-Salicylamide-Caffeine (BC HEADACHE POWDER PO) Take 1 packet by mouth daily as needed (for headache).     atorvastatin (LIPITOR) 20 MG tablet Take 1 tablet (20 mg total) by mouth daily. 90 tablet 3   dexamethasone (DECADRON) 4 MG tablet Take 1 tablet (4 mg total) by mouth daily. Take 1 tablet day after chemo and 1 tablet 2 days after chemo with food 8 tablet 0   dicyclomine (BENTYL) 10 MG capsule Take 1 capsule (10 mg total) by mouth 3 (three) times daily as needed for spasms. 30 capsule 0   fluconazole (DIFLUCAN) 100 MG tablet Take 1 tablet (100 mg total) by mouth daily. 30 tablet 0   fluticasone (FLONASE) 50 MCG/ACT nasal spray Place 2 sprays into both nostrils 2 (two) times daily. Decrease to 2 sprays/nostril daily after 5 days 16 g 2   irbesartan-hydrochlorothiazide (AVALIDE) 150-12.5 MG tablet Take 1 tablet by mouth daily. 90 tablet 2   lidocaine-prilocaine (EMLA) cream Apply to affected area once 30 g 3   LORazepam (ATIVAN) 0.5 MG tablet Take 1 tablet (0.5 mg total) by mouth at bedtime as needed for sleep. 30 tablet 0   ondansetron (ZOFRAN) 8 MG tablet Take 1 tablet (8 mg total) by mouth 2 (two) times daily as needed. Start on the third day after chemotherapy. 30 tablet 1   oxyCODONE (OXY IR/ROXICODONE) 5 MG immediate release tablet Take 1 tablet (5 mg total) by mouth every 6 (six)  hours as needed for severe pain. 20 tablet 0   prochlorperazine (COMPAZINE) 10 MG tablet TAKE 1 TABLET BY MOUTH EVERY 6 HOURS AS NEEDED FOR NAUSEA 30 tablet 1   QUEtiapine (SEROQUEL) 25 MG tablet TAKE 1 TO 2 TABLETS(25 TO 50 MG) BY MOUTH AT BEDTIME 90 tablet 3   SUMAtriptan (IMITREX) 50 MG tablet Take one tablet by mouth at the first sign of headache. May repeat in 2 hours if headache persists or recurs. 10 tablet 0   Tetrahydrozoline HCl (VISINE OP) Apply 1 drop to eye as needed (for allergies).     No current facility-administered medications for this visit.    PHYSICAL EXAMINATION: ECOG PERFORMANCE STATUS: 1 - Symptomatic but completely ambulatory  Vitals:   03/06/20 0911  BP: 131/88  Pulse: (!) 137  Resp: 20  Temp: 98 F (36.7 C)  SpO2: 93%   Filed Weights   03/06/20 0911  Weight: 274 lb 6.4 oz (124.5 kg)    LABORATORY DATA:  I have reviewed the data as listed CMP Latest Ref Rng & Units 03/06/2020 02/28/2020 02/14/2020  Glucose 70 - 99 mg/dL 195(H) 141(H) 185(H)  BUN 6 - 20 mg/dL 16 10  11  Creatinine 0.44 - 1.00 mg/dL 0.78 0.68 0.72  Sodium 135 - 145 mmol/L 132(L) 140 142  Potassium 3.5 - 5.1 mmol/L 3.3(L) 3.8 3.8  Chloride 98 - 111 mmol/L 97(L) 105 106  CO2 22 - 32 mmol/L _0 Calcium 8.9 - 10.3 mg/dL 9.1 8.9 9.3  Total Protein 6.5 - 8.1 g/dL 7.5 6.7 6.8  Total Bilirubin 0.3 - 1.2 mg/dL 0.9 0.4 0.4  Alkaline Phos 38 - 126 U/L 106 114 134(H)  AST 15 - 41 U/L 13(L) 13(L) 15  ALT 0 - 44 U/L _1 Lab Results  Component Value Date   WBC 7.8 03/06/2020   HGB 11.2 (L) 03/06/2020   HCT 31.5 (L) 03/06/2020   MCV 87.5 03/06/2020   PLT 307 03/06/2020   NEUTROABS 5.7 03/06/2020    ASSESSMENT & PLAN:  Malignant neoplasm of upper-outer quadrant of right breast in female, estrogen receptor positive ([place]) 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%, HER-2 negative 12 o'clock position: 1.9  cm: Biopsy: Grade 3 IDC ER 40% (weakly positive), PR 0%, HER-2 negative, Ki-67 40%, right axillary lymph node biopsied benign but discordant T2N?M0 stage IIa versus stage IIIa (based on the lymph node)  Treatment plan: 1.Neoadjuvant chemotherapy with dose dense Adriamycin and Cytoxan followed by Taxol and Carbo 2.depending on the response breast conserving surgery versus mastectomy 3.Adjuvant radiation 4.Followed by adjuvant antiestrogen therapy with abemaciclib ----------------------------------------------------------------------------------------------------------------------------------------------------- Current treatment: Cycle 2 Taxol (carbo being given every 3 weeks) Echocardiogram 12/27/2019: EF 60 to 65%  Chemo Toxicities: 1.Mild nausea: Anti-emetics 2.monitoring for chemo related toxicities and blood counts. 3. Dysgeusia: Keeping up with oral intake  Severe pain in the left arm: Accompanied by swelling: I am worried about blood clot.  She has an ultrasound appointment at 1 PM. We will decide if she needs to be on anticoagulation. For pain relief I renewed her prescription for Percocets.  Return clinic weekly for Taxol and every other week for follow-up with    No orders of the defined types were placed in this encounter.  The patient has a good understanding of the overall plan. she agrees with it. she will call with any problems that may develop before the next visit here.  Total time spent: 30 mins including face to face time and time spent for planning, charting and coordination of care  Cathy Lose, MD 03/06/2020  I, Cloyde Reams Dorshimer, am acting as scribe for Dr. Nicholas Parrish.  I have reviewed the above documentation for accuracy and completeness, and I agree with the above.   Addendum: Ultrasound of the left upper extremity showed extensive DVT throughout the arm Recommendation: Start Xarelto Sent a prescription to her pharmacy. Counseled her on  the risks and benefits of Xarelto.

## 2020-03-06 ENCOUNTER — Inpatient Hospital Stay: Payer: BC Managed Care – PPO | Attending: Hematology and Oncology

## 2020-03-06 ENCOUNTER — Ambulatory Visit (HOSPITAL_COMMUNITY)
Admission: RE | Admit: 2020-03-06 | Discharge: 2020-03-06 | Disposition: A | Discharge status: Home / Self Care | Payer: BC Managed Care – PPO | Source: Ambulatory Visit | Attending: Hematology and Oncology | Admitting: Hematology and Oncology

## 2020-03-06 ENCOUNTER — Inpatient Hospital Stay: Payer: BC Managed Care – PPO | Admitting: Hematology and Oncology

## 2020-03-06 ENCOUNTER — Other Ambulatory Visit: Payer: Self-pay

## 2020-03-06 ENCOUNTER — Inpatient Hospital Stay: Payer: BC Managed Care – PPO

## 2020-03-06 ENCOUNTER — Encounter: Payer: Self-pay | Admitting: Licensed Clinical Social Worker

## 2020-03-06 ENCOUNTER — Encounter: Payer: Self-pay | Admitting: *Deleted

## 2020-03-06 VITALS — HR 113

## 2020-03-06 DIAGNOSIS — Z170 Estrogen receptor positive status [ER+]: Secondary | ICD-10-CM | POA: Insufficient documentation

## 2020-03-06 DIAGNOSIS — C50411 Malignant neoplasm of upper-outer quadrant of right female breast: Secondary | ICD-10-CM

## 2020-03-06 DIAGNOSIS — M79602 Pain in left arm: Secondary | ICD-10-CM | POA: Insufficient documentation

## 2020-03-06 DIAGNOSIS — Z95828 Presence of other vascular implants and grafts: Secondary | ICD-10-CM

## 2020-03-06 DIAGNOSIS — Z79899 Other long term (current) drug therapy: Secondary | ICD-10-CM | POA: Diagnosis not present

## 2020-03-06 DIAGNOSIS — Z5111 Encounter for antineoplastic chemotherapy: Secondary | ICD-10-CM | POA: Insufficient documentation

## 2020-03-06 DIAGNOSIS — I82622 Acute embolism and thrombosis of deep veins of left upper extremity: Secondary | ICD-10-CM | POA: Insufficient documentation

## 2020-03-06 LAB — CMP (CANCER CENTER ONLY)
ALT: 18 U/L (ref 0–44)
AST: 13 U/L — ABNORMAL LOW (ref 15–41)
Albumin: 3.8 g/dL (ref 3.5–5.0)
Alkaline Phosphatase: 106 U/L (ref 38–126)
Anion gap: 13 (ref 5–15)
BUN: 16 mg/dL (ref 6–20)
CO2: 22 mmol/L (ref 22–32)
Calcium: 9.1 mg/dL (ref 8.9–10.3)
Chloride: 97 mmol/L — ABNORMAL LOW (ref 98–111)
Creatinine: 0.78 mg/dL (ref 0.44–1.00)
GFR, Estimated: >60 mL/min (ref >60)
Glucose, Bld: 195 mg/dL — ABNORMAL HIGH (ref 70–99)
Potassium: 3.3 mmol/L — ABNORMAL LOW (ref 3.5–5.1)
Sodium: 132 mmol/L — ABNORMAL LOW (ref 135–145)
Total Bilirubin: 0.9 mg/dL (ref 0.3–1.2)
Total Protein: 7.5 g/dL (ref 6.5–8.1)

## 2020-03-06 LAB — CBC WITH DIFFERENTIAL (CANCER CENTER ONLY)
Abs Immature Granulocytes: 0.07 10*3/uL (ref 0.00–0.07)
Basophils Absolute: 0.1 10*3/uL (ref 0.0–0.1)
Basophils Relative: 1 %
Eosinophils Absolute: 0 10*3/uL (ref 0.0–0.5)
Eosinophils Relative: 0 %
HCT: 31.5 % — ABNORMAL LOW (ref 36.0–46.0)
Hemoglobin: 11.2 g/dL — ABNORMAL LOW (ref 12.0–15.0)
Immature Granulocytes: 1 %
Lymphocytes Relative: 14 %
Lymphs Abs: 1.1 10*3/uL (ref 0.7–4.0)
MCH: 31.1 pg (ref 26.0–34.0)
MCHC: 35.6 g/dL (ref 30.0–36.0)
MCV: 87.5 fL (ref 80.0–100.0)
Monocytes Absolute: 0.9 10*3/uL (ref 0.1–1.0)
Monocytes Relative: 11 %
Neutro Abs: 5.7 10*3/uL (ref 1.7–7.7)
Neutrophils Relative %: 73 %
Platelet Count: 307 10*3/uL (ref 150–400)
RBC: 3.6 MIL/uL — ABNORMAL LOW (ref 3.87–5.11)
RDW: 19.5 % — ABNORMAL HIGH (ref 11.5–15.5)
WBC Count: 7.8 10*3/uL (ref 4.0–10.5)
nRBC: 0.3 % — ABNORMAL HIGH (ref 0.0–0.2)

## 2020-03-06 MED ORDER — FAMOTIDINE IN NACL 20-0.9 MG/50ML-% IV SOLN
20.0000 mg | Freq: Once | INTRAVENOUS | Status: AC
  Administered 2020-03-06: 20 mg via INTRAVENOUS

## 2020-03-06 MED ORDER — HEPARIN SOD (PORK) LOCK FLUSH 100 UNIT/ML IV SOLN
500.0000 [IU] | Freq: Once | INTRAVENOUS | Status: AC | PRN
  Administered 2020-03-06: 500 [IU]
  Filled 2020-03-06: qty 5

## 2020-03-06 MED ORDER — SODIUM CHLORIDE 0.9 % IV SOLN
20.0000 mg | Freq: Once | INTRAVENOUS | Status: AC
  Administered 2020-03-06: 20 mg via INTRAVENOUS
  Filled 2020-03-06: qty 20

## 2020-03-06 MED ORDER — DIPHENHYDRAMINE HCL 50 MG/ML IJ SOLN
25.0000 mg | Freq: Once | INTRAMUSCULAR | Status: AC
  Administered 2020-03-06: 25 mg via INTRAVENOUS

## 2020-03-06 MED ORDER — ALTEPLASE 2 MG IJ SOLR
2.0000 mg | Freq: Once | INTRAMUSCULAR | Status: AC
Start: 2020-03-06 — End: 2020-03-06
  Administered 2020-03-06: 2 mg
  Filled 2020-03-06: qty 2

## 2020-03-06 MED ORDER — SODIUM CHLORIDE 0.9 % IV SOLN
80.0000 mg/m2 | Freq: Once | INTRAVENOUS | Status: AC
  Administered 2020-03-06: 192 mg via INTRAVENOUS
  Filled 2020-03-06: qty 32

## 2020-03-06 MED ORDER — SODIUM CHLORIDE 0.9% FLUSH
10.0000 mL | Freq: Once | INTRAVENOUS | Status: AC
  Administered 2020-03-06: 10 mL
  Filled 2020-03-06: qty 10

## 2020-03-06 MED ORDER — OXYCODONE HCL 5 MG PO TABS: 5.0000 mg | ORAL_TABLET | Freq: Four times a day (QID) | ORAL | 0 refills | Status: DC | PRN

## 2020-03-06 MED ORDER — SODIUM CHLORIDE 0.9 % IV SOLN
Freq: Once | INTRAVENOUS | Status: AC
  Filled 2020-03-06: qty 250

## 2020-03-06 MED ORDER — DIPHENHYDRAMINE HCL 50 MG/ML IJ SOLN
INTRAMUSCULAR | Status: AC
  Filled 2020-03-06: qty 1

## 2020-03-06 MED ORDER — SODIUM CHLORIDE 0.9% FLUSH
10.0000 mL | INTRAVENOUS | Status: DC | PRN
  Administered 2020-03-06: 10 mL
  Filled 2020-03-06: qty 10

## 2020-03-06 MED ORDER — ALTEPLASE 2 MG IJ SOLR
INTRAMUSCULAR | Status: AC
  Filled 2020-03-06: qty 2

## 2020-03-06 MED ORDER — FAMOTIDINE IN NACL 20-0.9 MG/50ML-% IV SOLN
INTRAVENOUS | Status: AC
  Filled 2020-03-06: qty 50

## 2020-03-06 MED ORDER — RIVAROXABAN (XARELTO) VTE STARTER PACK (15 & 20 MG)
ORAL_TABLET | ORAL | 0 refills | Status: DC
Start: 2020-03-06 — End: 2020-04-03

## 2020-03-06 NOTE — Progress Notes (Signed)
Unable to get blood return from port. cathflo administered by Dorathy Daft RN. Blood drawn from arm by Clydie Braun LPN

## 2020-03-06 NOTE — Progress Notes (Signed)
Per MD pt tested positive for left arm DVT needing to start Xarelto.  Pt in the lobby and RN educated pt on Xarelto starter pack administration, as well as provided paper information regarding symptoms of bleeding to monitor for.  Pt also given Xarelto savings card information to contact and apply for discount card.  Pt educated to call the office with any issues.  Pt appreciative of the advice and verbalized understanding.

## 2020-03-06 NOTE — Patient Instructions (Signed)
Rivaroxaban oral tablets What is this medicine? RIVAROXABAN (ri va ROX a ban) is an anticoagulant (blood thinner). It is used to treat blood clots in the lungs or in the veins. It is also used to prevent blood clots in the lungs or in the veins. It is also used to lower the chance of stroke in people with a medical condition called atrial fibrillation. This medicine may be used for other purposes; ask your health care provider or pharmacist if you have questions. COMMON BRAND NAME(S): Xarelto, Xarelto Starter Pack What should I tell my health care provider before I take this medicine? They need to know if you have any of these conditions:  antiphospholipid antibody syndrome  artificial heart valve  bleeding disorders  bleeding in the brain  blood in your stools (black or tarry stools) or if you have blood in your vomit  history of blood clots  history of stomach bleeding  kidney disease  liver disease  low blood counts, like low white cell, platelet, or red cell counts  recent or planned spinal or epidural procedure  take medicines that treat or prevent blood clots  an unusual or allergic reaction to rivaroxaban, other medicines, foods, dyes, or preservatives  pregnant or trying to get pregnant  breast-feeding How should I use this medicine? Take this medicine by mouth with a glass of water. Follow the directions on the prescription label. Take your medicine at regular intervals. Do not take it more often than directed. Do not stop taking except on your doctor's advice. Stopping this medicine may increase your risk of a blood clot. Be sure to refill your prescription before you run out of medicine. If you are taking this medicine after hip or knee replacement surgery, take it with or without food. If you are taking this medicine for atrial fibrillation, take it with your evening meal. If you are taking this medicine to treat blood clots, take it with food at the same time each  day. If you are unable to swallow your tablet, you may crush the tablet and mix it in applesauce. Then, immediately eat the applesauce. You should eat more food right after you eat the applesauce containing the crushed tablet. Talk to your pediatrician regarding the use of this medicine in children. Special care may be needed. Overdosage: If you think you have taken too much of this medicine contact a poison control center or emergency room at once. NOTE: This medicine is only for you. Do not share this medicine with others. What if I miss a dose? If you take your medicine once a day and miss a dose, take the missed dose as soon as you remember. If it is almost time for your next dose, take only that dose. Do not take double or extra doses. If you take your medicine twice a day and miss a dose, take the missed dose immediately. In this instance, 2 tablets may be taken at the same time. The next day you should take 1 tablet twice a day as directed. What may interact with this medicine? Do not take this medicine with any of the following medications:  defibrotide This medicine may also interact with the following medications:  aspirin and aspirin-like medicines  certain antibiotics like erythromycin, azithromycin, and clarithromycin  certain medicines for fungal infections like ketoconazole and itraconazole  certain medicines for irregular heart beat like amiodarone, quinidine, dronedarone  certain medicines for seizures like carbamazepine, phenytoin  certain medicines that treat or prevent blood clots  like warfarin, enoxaparin, and dalteparin  conivaptan  felodipine  indinavir  lopinavir; ritonavir  NSAIDS, medicines for pain and inflammation, like ibuprofen or naproxen  ranolazine  rifampin  ritonavir  SNRIs, medicines for depression, like desvenlafaxine, duloxetine, levomilnacipran, venlafaxine  SSRIs, medicines for depression, like citalopram, escitalopram, fluoxetine,  fluvoxamine, paroxetine, sertraline  St. John's wort  verapamil This list may not describe all possible interactions. Give your health care provider a list of all the medicines, herbs, non-prescription drugs, or dietary supplements you use. Also tell them if you smoke, drink alcohol, or use illegal drugs. Some items may interact with your medicine. What should I watch for while using this medicine? Visit your healthcare professional for regular checks on your progress. You may need blood work done while you are taking this medicine. Your condition will be monitored carefully while you are receiving this medicine. It is important not to miss any appointments. Avoid sports and activities that might cause injury while you are using this medicine. Severe falls or injuries can cause unseen bleeding. Be careful when using sharp tools or knives. Consider using an electric razor. Take special care brushing or flossing your teeth. Report any injuries, bruising, or red spots on the skin to your healthcare professional. If you are going to need surgery or other procedure, tell your healthcare professional that you are taking this medicine. Wear a medical ID bracelet or chain. Carry a card that describes your disease and details of your medicine and dosage times. What side effects may I notice from receiving this medicine? Side effects that you should report to your doctor or health care professional as soon as possible:  allergic reactions like skin rash, itching or hives, swelling of the face, lips, or tongue  back pain  redness, blistering, peeling or loosening of the skin, including inside the mouth  signs and symptoms of bleeding such as bloody or black, tarry stools; red or dark-brown urine; spitting up blood or brown material that looks like coffee grounds; red spots on the skin; unusual bruising or bleeding from the eye, gums, or nose  signs and symptoms of a blood clot such as chest pain;  shortness of breath; pain, swelling, or warmth in the leg  signs and symptoms of a stroke such as changes in vision; confusion; trouble speaking or understanding; severe headaches; sudden numbness or weakness of the face, arm or leg; trouble walking; dizziness; loss of coordination Side effects that usually do not require medical attention (report to your doctor or health care professional if they continue or are bothersome):  dizziness  muscle pain This list may not describe all possible side effects. Call your doctor for medical advice about side effects. You may report side effects to FDA at 1-800-FDA-1088. Where should I keep my medicine? Keep out of the reach of children. Store at room temperature between 15 and 30 degrees C (59 and 86 degrees F). Throw away any unused medicine after the expiration date. NOTE: This sheet is a summary. It may not cover all possible information. If you have questions about this medicine, talk to your doctor, pharmacist, or health care provider.  2020 Elsevier/Gold Standard (2018-05-15 09:45:59)  

## 2020-03-06 NOTE — Progress Notes (Signed)
Per Dr. Pamelia Hoit ok to treat with tachycardia today.
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
## 2020-03-06 NOTE — Progress Notes (Signed)
Upper extremity venous LT study completed.  Preliminary results relayed to Cathy Parrish for Cathy Hoit, MD.   See CV Proc for preliminary results report.   Jean Rosenthal, RDMS

## 2020-03-06 NOTE — Addendum Note (Signed)
Addended by: Serena Croissant on: 03/06/2020 02:08 PM   Modules accepted: Orders

## 2020-03-06 NOTE — Patient Instructions (Signed)
Paclitaxel injection What is this medicine? PACLITAXEL (PAK li TAX el) is a chemotherapy drug. It targets fast dividing cells, like cancer cells, and causes these cells to die. This medicine is used to treat ovarian cancer, breast cancer, lung cancer, Kaposi's sarcoma, and other cancers. This medicine may be used for other purposes; ask your health care provider or pharmacist if you have questions. COMMON BRAND NAME(S): Onxol, Taxol What should I tell my health care provider before I take this medicine? They need to know if you have any of these conditions:  history of irregular heartbeat  liver disease  low blood counts, like low white cell, platelet, or red cell counts  lung or breathing disease, like asthma  tingling of the fingers or toes, or other nerve disorder  an unusual or allergic reaction to paclitaxel, alcohol, polyoxyethylated castor oil, other chemotherapy, other medicines, foods, dyes, or preservatives  pregnant or trying to get pregnant  breast-feeding How should I use this medicine? This drug is given as an infusion into a vein. It is administered in a hospital or clinic by a specially trained health care professional. Talk to your pediatrician regarding the use of this medicine in children. Special care may be needed. Overdosage: If you think you have taken too much of this medicine contact a poison control center or emergency room at once. NOTE: This medicine is only for you. Do not share this medicine with others. What if I miss a dose? It is important not to miss your dose. Call your doctor or health care professional if you are unable to keep an appointment. What may interact with this medicine? Do not take this medicine with any of the following medications:  disulfiram  metronidazole This medicine may also interact with the following medications:  antiviral medicines for hepatitis, HIV or AIDS  certain antibiotics like erythromycin and  clarithromycin  certain medicines for fungal infections like ketoconazole and itraconazole  certain medicines for seizures like carbamazepine, phenobarbital, phenytoin  gemfibrozil  nefazodone  rifampin  St. John's wort This list may not describe all possible interactions. Give your health care provider a list of all the medicines, herbs, non-prescription drugs, or dietary supplements you use. Also tell them if you smoke, drink alcohol, or use illegal drugs. Some items may interact with your medicine. What should I watch for while using this medicine? Your condition will be monitored carefully while you are receiving this medicine. You will need important blood work done while you are taking this medicine. This medicine can cause serious allergic reactions. To reduce your risk you will need to take other medicine(s) before treatment with this medicine. If you experience allergic reactions like skin rash, itching or hives, swelling of the face, lips, or tongue, tell your doctor or health care professional right away. In some cases, you may be given additional medicines to help with side effects. Follow all directions for their use. This drug may make you feel generally unwell. This is not uncommon, as chemotherapy can affect healthy cells as well as cancer cells. Report any side effects. Continue your course of treatment even though you feel ill unless your doctor tells you to stop. Call your doctor or health care professional for advice if you get a fever, chills or sore throat, or other symptoms of a cold or flu. Do not treat yourself. This drug decreases your body's ability to fight infections. Try to avoid being around people who are sick. This medicine may increase your risk to bruise   or bleed. Call your doctor or health care professional if you notice any unusual bleeding. Be careful brushing and flossing your teeth or using a toothpick because you may get an infection or bleed more easily.  If you have any dental work done, tell your dentist you are receiving this medicine. Avoid taking products that contain aspirin, acetaminophen, ibuprofen, naproxen, or ketoprofen unless instructed by your doctor. These medicines may hide a fever. Do not become pregnant while taking this medicine. Women should inform their doctor if they wish to become pregnant or think they might be pregnant. There is a potential for serious side effects to an unborn child. Talk to your health care professional or pharmacist for more information. Do not breast-feed an infant while taking this medicine. Men are advised not to father a child while receiving this medicine. This product may contain alcohol. Ask your pharmacist or healthcare provider if this medicine contains alcohol. Be sure to tell all healthcare providers you are taking this medicine. Certain medicines, like metronidazole and disulfiram, can cause an unpleasant reaction when taken with alcohol. The reaction includes flushing, headache, nausea, vomiting, sweating, and increased thirst. The reaction can last from 30 minutes to several hours. What side effects may I notice from receiving this medicine? Side effects that you should report to your doctor or health care professional as soon as possible:  allergic reactions like skin rash, itching or hives, swelling of the face, lips, or tongue  breathing problems  changes in vision  fast, irregular heartbeat  high or low blood pressure  mouth sores  pain, tingling, numbness in the hands or feet  signs of decreased platelets or bleeding - bruising, pinpoint red spots on the skin, black, tarry stools, blood in the urine  signs of decreased red blood cells - unusually weak or tired, feeling faint or lightheaded, falls  signs of infection - fever or chills, cough, sore throat, pain or difficulty passing urine  signs and symptoms of liver injury like dark yellow or brown urine; general ill feeling or  flu-like symptoms; light-colored stools; loss of appetite; nausea; right upper belly pain; unusually weak or tired; yellowing of the eyes or skin  swelling of the ankles, feet, hands  unusually slow heartbeat Side effects that usually do not require medical attention (report to your doctor or health care professional if they continue or are bothersome):  diarrhea  hair loss  loss of appetite  muscle or joint pain  nausea, vomiting  pain, redness, or irritation at site where injected  tiredness This list may not describe all possible side effects. Call your doctor for medical advice about side effects. You may report side effects to FDA at 1-800-FDA-1088. Where should I keep my medicine? This drug is given in a hospital or clinic and will not be stored at home. NOTE: This sheet is a summary. It may not cover all possible information. If you have questions about this medicine, talk to your doctor, pharmacist, or health care provider.  2020 [employer]vier/Gold Standard (2016-10-19 13:14:55)  

## 2020-03-06 NOTE — Assessment & Plan Note (Signed)
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%, right axillary lymph node biopsied benign but discordant T2N?M0 stage IIa versus stage IIIa (based on the lymph node)  Treatment plan: 1.Neoadjuvant chemotherapy with dose dense Adriamycin and Cytoxan followed by Taxol and Carbo 2.depending on the response breast conserving surgery versus mastectomy 3.Adjuvant radiation 4.Followed by adjuvant antiestrogen therapy with abemaciclib ----------------------------------------------------------------------------------------------------------------------------------------------------- Current treatment: Cycle 2 Taxol (carbo being given every 3 weeks) Echocardiogram 12/27/2019: EF 60 to 65%  Chemo Toxicities: 1.Mild nausea: Anti-emetics 2.monitoring for chemo related toxicities and blood counts. 3. Dysgeusia: Keeping up with oral intake  Return clinic weekly for Taxol and every other week for follow-up with

## 2020-03-11 ENCOUNTER — Telehealth: Payer: Self-pay | Admitting: Hematology and Oncology

## 2020-03-11 ENCOUNTER — Telehealth: Payer: Self-pay | Admitting: *Deleted

## 2020-03-11 ENCOUNTER — Other Ambulatory Visit: Payer: BC Managed Care – PPO

## 2020-03-11 DIAGNOSIS — Z20822 Contact with and (suspected) exposure to covid-19: Secondary | ICD-10-CM

## 2020-03-11 NOTE — Telephone Encounter (Signed)
Scheduled per 1/6 los. Pt will receive an updated appt calendar per next visit appt notes  

## 2020-03-11 NOTE — Telephone Encounter (Signed)
Received call from pt with complaint of cough and nasal congestion x3 days. Pt denies fever.  Per MD pt needing to be tested for Covid 19 and to take Mucinex DM as directed on the box.  RN gave pt the phone number for [facility] testing sites to be scheduled and educated pt on Mucinex DM.  RN encouraged pt to return call to the office when results come back.  Pt verbalized understanding and appreciative of the advice.

## 2020-03-12 ENCOUNTER — Telehealth: Payer: Self-pay | Admitting: *Deleted

## 2020-03-12 NOTE — Telephone Encounter (Signed)
Received call from pt stating she was tested yesterday for Covid 19 and is awaiting results.  Pt states she was told results would take 48-72 hours.  Pt currently scheduled for tx tomorrow.  Per MD apt needing to be canceled with pending Covid results.  Pt verbalized understanding and educated to call the office when test results return.  High priority message sent to scheduling to re schedule tomorrow's apts.

## 2020-03-13 ENCOUNTER — Inpatient Hospital Stay: Payer: BC Managed Care – PPO

## 2020-03-13 ENCOUNTER — Telehealth: Payer: Self-pay | Admitting: *Deleted

## 2020-03-13 ENCOUNTER — Telehealth: Payer: Self-pay

## 2020-03-13 ENCOUNTER — Other Ambulatory Visit: Payer: Self-pay

## 2020-03-13 ENCOUNTER — Inpatient Hospital Stay: Payer: BC Managed Care – PPO | Admitting: Adult Health

## 2020-03-13 DIAGNOSIS — U071 COVID-19: Secondary | ICD-10-CM

## 2020-03-13 LAB — SARS-COV-2, NAA 2 DAY TAT

## 2020-03-13 LAB — NOVEL CORONAVIRUS, NAA: SARS-CoV-2, NAA: DETECTED — AB

## 2020-03-13 NOTE — Telephone Encounter (Signed)
RN spoke with patient - pt anxious due to recent results showing positive results for Covid.   RN educated patient on guidelines for clinic to be out of clinic X 21 days.  Pt concerned due to active chemotherapy.  RN educated that treatment can be held and added at the end of original date for completion.  MD aware - Referral placed to [facility].  Pt aware.

## 2020-03-13 NOTE — Telephone Encounter (Signed)
Spoke to pt concerning covid symptoms. Encourage pt to continue with Mucinex Dm as directed. Relate she has been able to "cough up phlegm". Denies further questions or needs at this time

## 2020-03-14 ENCOUNTER — Other Ambulatory Visit: Payer: Self-pay | Admitting: Nurse Practitioner

## 2020-03-14 ENCOUNTER — Ambulatory Visit (HOSPITAL_COMMUNITY)
Admission: RE | Admit: 2020-03-14 | Discharge: 2020-03-14 | Disposition: A | Discharge status: Home / Self Care | Payer: BC Managed Care – PPO | Source: Ambulatory Visit | Attending: Pulmonary Disease | Admitting: Pulmonary Disease

## 2020-03-14 DIAGNOSIS — U071 COVID-19: Secondary | ICD-10-CM | POA: Diagnosis not present

## 2020-03-14 DIAGNOSIS — C50411 Malignant neoplasm of upper-outer quadrant of right female breast: Secondary | ICD-10-CM | POA: Insufficient documentation

## 2020-03-14 DIAGNOSIS — Z170 Estrogen receptor positive status [ER+]: Secondary | ICD-10-CM | POA: Insufficient documentation

## 2020-03-14 DIAGNOSIS — I10 Essential (primary) hypertension: Secondary | ICD-10-CM | POA: Insufficient documentation

## 2020-03-14 MED ORDER — SODIUM CHLORIDE 0.9 % IV SOLN: INTRAVENOUS | Status: DC | PRN

## 2020-03-14 MED ORDER — METHYLPREDNISOLONE SODIUM SUCC 125 MG IJ SOLR: 125.0000 mg | Freq: Once | INTRAMUSCULAR | Status: DC | PRN

## 2020-03-14 MED ORDER — FAMOTIDINE IN NACL 20-0.9 MG/50ML-% IV SOLN: 20.0000 mg | Freq: Once | INTRAVENOUS | Status: DC | PRN

## 2020-03-14 MED ORDER — EPINEPHRINE 0.3 MG/0.3ML IJ SOAJ: 0.3000 mg | Freq: Once | INTRAMUSCULAR | Status: DC | PRN

## 2020-03-14 MED ORDER — ALBUTEROL SULFATE HFA 108 (90 BASE) MCG/ACT IN AERS: 2.0000 | INHALATION_SPRAY | Freq: Once | RESPIRATORY_TRACT | Status: DC | PRN

## 2020-03-14 MED ORDER — DIPHENHYDRAMINE HCL 50 MG/ML IJ SOLN: 50.0000 mg | Freq: Once | INTRAMUSCULAR | Status: DC | PRN

## 2020-03-14 MED ORDER — SOTROVIMAB 500 MG/8ML IV SOLN
500.0000 mg | Freq: Once | INTRAVENOUS | Status: AC
  Administered 2020-03-14: 500 mg via INTRAVENOUS

## 2020-03-14 NOTE — Progress Notes (Signed)
I connected by phone with Cathy Parrish on 03/14/2020 at 9:37 AM to discuss the potential use of a new treatment for mild to moderate COVID-19 viral infection in non-hospitalized patients.  This patient is a 50 y.o. female that meets the FDA criteria for Emergency Use Authorization of COVID monoclonal antibody sotrovimab.  Has a (+) direct SARS-CoV-2 viral test result  Has mild or moderate COVID-19   Is NOT hospitalized due to COVID-19  Is within 10 days of symptom onset  Has at least one of the high risk factor(s) for progression to severe COVID-19 and/or hospitalization as defined in EUA.  Specific high risk criteria : BMI > 25 and Immunosuppressive Disease or Treatment   I have spoken and communicated the following to the patient or parent/caregiver regarding COVID monoclonal antibody treatment:  1. FDA has authorized the emergency use for the treatment of mild to moderate COVID-19 in adults and pediatric patients with positive results of direct SARS-CoV-2 viral testing who are 40 years of age and older weighing at least 40 kg, and who are at high risk for progressing to severe COVID-19 and/or hospitalization.  2. The significant known and potential risks and benefits of COVID monoclonal antibody, and the extent to which such potential risks and benefits are unknown.  3. Information on available alternative treatments and the risks and benefits of those alternatives, including clinical trials.  4. Patients treated with COVID monoclonal antibody should continue to self-isolate and use infection control measures (e.g., wear mask, isolate, social distance, avoid sharing personal items, clean and disinfect "high touch" surfaces, and frequent handwashing) according to CDC guidelines.   5. The patient or parent/caregiver has the option to accept or refuse COVID monoclonal antibody treatment.  After reviewing this information with the patient, the patient has agreed to receive one of the  available covid 19 monoclonal antibodies and will be provided an appropriate fact sheet prior to infusion. Jobe Gibbon, NP 03/14/2020 9:37 AM

## 2020-03-14 NOTE — Discharge Instructions (Signed)

## 2020-03-14 NOTE — Progress Notes (Signed)
Patient reviewed Fact Sheet for Patients, Parents, and Caregivers for Emergency Use Authorization (EUA) of sotrovimab for the Treatment of Coronavirus. Patient also reviewed and is agreeable to the estimated cost of treatment. Patient is agreeable to proceed.   

## 2020-03-14 NOTE — Progress Notes (Signed)
Diagnosis: COVID-19  Physician: Dr. Patrick Wright  Procedure: [facility] Med: Sotrovimab infusion - Provided patient with sotrovimab fact sheet for patients, parents, and caregivers prior to infusion.   Complications: No immediate complications noted  Discharge: Discharged home    

## 2020-03-18 ENCOUNTER — Other Ambulatory Visit: Payer: Self-pay | Admitting: *Deleted

## 2020-03-18 DIAGNOSIS — Z170 Estrogen receptor positive status [ER+]: Secondary | ICD-10-CM

## 2020-03-18 MED ORDER — PROCHLORPERAZINE MALEATE 10 MG PO TABS: 10.0000 mg | ORAL_TABLET | Freq: Four times a day (QID) | ORAL | 1 refills | Status: DC | PRN

## 2020-03-20 ENCOUNTER — Other Ambulatory Visit: Payer: BC Managed Care – PPO

## 2020-03-20 ENCOUNTER — Ambulatory Visit: Payer: BC Managed Care – PPO

## 2020-03-20 ENCOUNTER — Ambulatory Visit: Payer: BC Managed Care – PPO | Admitting: Hematology and Oncology

## 2020-03-20 ENCOUNTER — Encounter: Payer: Self-pay | Admitting: *Deleted

## 2020-03-27 ENCOUNTER — Ambulatory Visit: Payer: BC Managed Care – PPO

## 2020-03-27 ENCOUNTER — Other Ambulatory Visit: Payer: BC Managed Care – PPO

## 2020-03-27 ENCOUNTER — Ambulatory Visit
Admission: RE | Admit: 2020-03-27 | Discharge: 2020-03-27 | Disposition: A | Discharge status: Home / Self Care | Payer: BC Managed Care – PPO | Source: Ambulatory Visit | Attending: Adult Health | Admitting: Adult Health

## 2020-03-27 ENCOUNTER — Other Ambulatory Visit: Payer: Self-pay

## 2020-03-27 DIAGNOSIS — C50411 Malignant neoplasm of upper-outer quadrant of right female breast: Secondary | ICD-10-CM

## 2020-03-27 DIAGNOSIS — Z170 Estrogen receptor positive status [ER+]: Secondary | ICD-10-CM

## 2020-03-27 DIAGNOSIS — N6311 Unspecified lump in the right breast, upper outer quadrant: Secondary | ICD-10-CM | POA: Diagnosis not present

## 2020-03-31 ENCOUNTER — Other Ambulatory Visit: Payer: Self-pay | Admitting: Hematology and Oncology

## 2020-03-31 DIAGNOSIS — Z170 Estrogen receptor positive status [ER+]: Secondary | ICD-10-CM

## 2020-03-31 DIAGNOSIS — C50411 Malignant neoplasm of upper-outer quadrant of right female breast: Secondary | ICD-10-CM

## 2020-04-03 ENCOUNTER — Inpatient Hospital Stay: Payer: BC Managed Care – PPO | Attending: Hematology and Oncology

## 2020-04-03 ENCOUNTER — Encounter: Payer: Self-pay | Admitting: Licensed Clinical Social Worker

## 2020-04-03 ENCOUNTER — Other Ambulatory Visit: Payer: Self-pay

## 2020-04-03 ENCOUNTER — Inpatient Hospital Stay: Payer: BC Managed Care – PPO | Admitting: Medical

## 2020-04-03 ENCOUNTER — Inpatient Hospital Stay: Payer: BC Managed Care – PPO

## 2020-04-03 ENCOUNTER — Ambulatory Visit: Payer: BC Managed Care – PPO | Admitting: Hematology and Oncology

## 2020-04-03 VITALS — BP 141/92 | HR 91 | Temp 97.5°F | Resp 16 | Ht 66.0 in | Wt 272.6 lb

## 2020-04-03 DIAGNOSIS — Z8673 Personal history of transient ischemic attack (TIA), and cerebral infarction without residual deficits: Secondary | ICD-10-CM | POA: Insufficient documentation

## 2020-04-03 DIAGNOSIS — I10 Essential (primary) hypertension: Secondary | ICD-10-CM | POA: Insufficient documentation

## 2020-04-03 DIAGNOSIS — E119 Type 2 diabetes mellitus without complications: Secondary | ICD-10-CM | POA: Diagnosis not present

## 2020-04-03 DIAGNOSIS — Z5111 Encounter for antineoplastic chemotherapy: Secondary | ICD-10-CM | POA: Insufficient documentation

## 2020-04-03 DIAGNOSIS — C50411 Malignant neoplasm of upper-outer quadrant of right female breast: Secondary | ICD-10-CM | POA: Diagnosis not present

## 2020-04-03 DIAGNOSIS — I82622 Acute embolism and thrombosis of deep veins of left upper extremity: Secondary | ICD-10-CM | POA: Insufficient documentation

## 2020-04-03 DIAGNOSIS — Z170 Estrogen receptor positive status [ER+]: Secondary | ICD-10-CM | POA: Insufficient documentation

## 2020-04-03 DIAGNOSIS — J45909 Unspecified asthma, uncomplicated: Secondary | ICD-10-CM | POA: Insufficient documentation

## 2020-04-03 DIAGNOSIS — F32A Depression, unspecified: Secondary | ICD-10-CM | POA: Diagnosis not present

## 2020-04-03 DIAGNOSIS — Z95828 Presence of other vascular implants and grafts: Secondary | ICD-10-CM

## 2020-04-03 LAB — CBC WITH DIFFERENTIAL (CANCER CENTER ONLY)
Abs Immature Granulocytes: 0.01 10*3/uL (ref 0.00–0.07)
Basophils Absolute: 0 10*3/uL (ref 0.0–0.1)
Basophils Relative: 1 %
Eosinophils Absolute: 0.3 10*3/uL (ref 0.0–0.5)
Eosinophils Relative: 6 %
HCT: 36.2 % (ref 36.0–46.0)
Hemoglobin: 12 g/dL (ref 12.0–15.0)
Immature Granulocytes: 0 %
Lymphocytes Relative: 33 %
Lymphs Abs: 1.8 10*3/uL (ref 0.7–4.0)
MCH: 31.7 pg (ref 26.0–34.0)
MCHC: 33.1 g/dL (ref 30.0–36.0)
MCV: 95.5 fL (ref 80.0–100.0)
Monocytes Absolute: 0.8 10*3/uL (ref 0.1–1.0)
Monocytes Relative: 15 %
Neutro Abs: 2.5 10*3/uL (ref 1.7–7.7)
Neutrophils Relative %: 45 %
Platelet Count: 375 10*3/uL (ref 150–400)
RBC: 3.79 MIL/uL — ABNORMAL LOW (ref 3.87–5.11)
RDW: 19.3 % — ABNORMAL HIGH (ref 11.5–15.5)
WBC Count: 5.5 10*3/uL (ref 4.0–10.5)
nRBC: 0 % (ref 0.0–0.2)

## 2020-04-03 LAB — CMP (CANCER CENTER ONLY)
ALT: 23 U/L (ref 0–44)
AST: 18 U/L (ref 15–41)
Albumin: 4 g/dL (ref 3.5–5.0)
Alkaline Phosphatase: 93 U/L (ref 38–126)
Anion gap: 7 (ref 5–15)
BUN: 10 mg/dL (ref 6–20)
CO2: 27 mmol/L (ref 22–32)
Calcium: 9.6 mg/dL (ref 8.9–10.3)
Chloride: 106 mmol/L (ref 98–111)
Creatinine: 0.7 mg/dL (ref 0.44–1.00)
GFR, Estimated: >60 mL/min (ref >60)
Glucose, Bld: 114 mg/dL — ABNORMAL HIGH (ref 70–99)
Potassium: 3.7 mmol/L (ref 3.5–5.1)
Sodium: 140 mmol/L (ref 135–145)
Total Bilirubin: 0.6 mg/dL (ref 0.3–1.2)
Total Protein: 7.5 g/dL (ref 6.5–8.1)

## 2020-04-03 MED ORDER — SODIUM CHLORIDE 0.9 % IV SOLN
80.0000 mg/m2 | Freq: Once | INTRAVENOUS | Status: AC
  Administered 2020-04-03: 192 mg via INTRAVENOUS
  Filled 2020-04-03: qty 32

## 2020-04-03 MED ORDER — ALTEPLASE 2 MG IJ SOLR
2.0000 mg | Freq: Once | INTRAMUSCULAR | Status: AC
Start: 2020-04-03 — End: 2020-04-03
  Administered 2020-04-03: 2 mg
  Filled 2020-04-03: qty 2

## 2020-04-03 MED ORDER — SODIUM CHLORIDE 0.9 % IV SOLN
20.0000 mg | Freq: Once | INTRAVENOUS | Status: AC
  Administered 2020-04-03: 20 mg via INTRAVENOUS
  Filled 2020-04-03: qty 20
  Filled 2020-04-03: qty 2

## 2020-04-03 MED ORDER — DIPHENHYDRAMINE HCL 50 MG/ML IJ SOLN
25.0000 mg | Freq: Once | INTRAMUSCULAR | Status: AC
  Administered 2020-04-03: 25 mg via INTRAVENOUS

## 2020-04-03 MED ORDER — DIPHENHYDRAMINE HCL 50 MG/ML IJ SOLN
INTRAMUSCULAR | Status: AC
  Filled 2020-04-03: qty 1

## 2020-04-03 MED ORDER — SODIUM CHLORIDE 0.9% FLUSH
10.0000 mL | Freq: Once | INTRAVENOUS | Status: AC
  Administered 2020-04-03: 10 mL
  Filled 2020-04-03: qty 10

## 2020-04-03 MED ORDER — SODIUM CHLORIDE 0.9% FLUSH
10.0000 mL | INTRAVENOUS | Status: DC | PRN
  Administered 2020-04-03: 10 mL
  Filled 2020-04-03: qty 10

## 2020-04-03 MED ORDER — FAMOTIDINE IN NACL 20-0.9 MG/50ML-% IV SOLN
INTRAVENOUS | Status: AC
  Filled 2020-04-03: qty 50

## 2020-04-03 MED ORDER — RIVAROXABAN 20 MG PO TABS: 20.0000 mg | ORAL_TABLET | Freq: Every day | ORAL | 2 refills | Status: DC

## 2020-04-03 MED ORDER — HEPARIN SOD (PORK) LOCK FLUSH 100 UNIT/ML IV SOLN
500.0000 [IU] | Freq: Once | INTRAVENOUS | Status: AC | PRN
  Administered 2020-04-03: 500 [IU]
  Filled 2020-04-03: qty 5

## 2020-04-03 MED ORDER — SODIUM CHLORIDE 0.9 % IV SOLN
Freq: Once | INTRAVENOUS | Status: AC
Start: 2020-04-03 — End: 2020-04-03
  Filled 2020-04-03: qty 250

## 2020-04-03 MED ORDER — FAMOTIDINE IN NACL 20-0.9 MG/50ML-% IV SOLN
20.0000 mg | Freq: Once | INTRAVENOUS | Status: AC
  Administered 2020-04-03: 20 mg via INTRAVENOUS

## 2020-04-03 MED ORDER — ALTEPLASE 2 MG IJ SOLR
INTRAMUSCULAR | Status: AC
  Filled 2020-04-03: qty 2

## 2020-04-03 NOTE — Progress Notes (Signed)
[facility] CSW Progress Note  Holiday representative met with patient to offer ongoing support. Patient had delay in treatment due to illness, but is feeling well now. She actually found it helpful mentally to have a break and her arm is feeling better on the medication.  She is finding small ways to celebrate today including her good numbers, colorful head scarf, and recent increase in walking and activity. CSW validated feelings and encouraged patient to continue the good work.  CSW will continue to check-in periodically through treatment. Patient may call as needed between visits.   [name] , LCSW

## 2020-04-03 NOTE — Progress Notes (Signed)
[facility] Progress Note   Cathy Parrish [id number] 01/27/1971 50 y.o.  Cathy Parrish is managed by Dr. Nicholas Lose  Actively treated with chemotherapy/immunotherapy/hormonal therapy: yes  Current therapy: Paclitaxel  Last treated: 03/06/2020 (cycle 5, day 8)  Next scheduled appointment with provider: 04/10/2020  Assessment: Plan:    Malignant neoplasm of upper-outer quadrant of right breast in female, estrogen receptor positive ([place])   ER positive malignant neoplasm of the right breast: Cathy Parrish continues to be followed by Dr. Nicholas Lose and presents to the clinic today for consideration of cycle 5, day 15 of paclitaxel.  We will proceed with her treatment today and will have her return to clinic on 04/10/2020 for labs and treatment only.  Please see After Visit Summary for patient specific instructions.  Future Appointments  Date Time Provider [place]  04/10/2020  9:00 AM [facility] LAB [facility] None  04/10/2020  9:15 AM [facility] [place] FLUSH [facility] None  04/10/2020 10:00 AM [facility] INFUSION [facility] None    No orders of the defined types were placed in this encounter.      Subjective:   Patient ID:  Cathy Parrish is a 50 y.o. (DOB 10/16/1970) female.  Chief Complaint: No chief complaint on file.   HPI Cathy Parrish  is a 50 y.o. female with a diagnosis of an ER positive malignant neoplasm of the right breast.  She is managed by Dr. Nicholas Lose and presents to the clinic today for consideration of cycle 5, day 15 of paclitaxel.  She reports that she continues to do well with no acute issues of concern.  She denies fevers, chills, sweats, nausea, vomiting, constipation, or diarrhea.   Medications: I have reviewed the patient's current medications.  Allergies: No Known Allergies  Past Medical History:  Diagnosis Date  . Asthma   . Breast cancer ([place]) 11/2019  . Depression   . Diabetes mellitus without  complication ([facility])    diet controlled  . Family history of breast cancer   . Family history of colon cancer   . Family history of liver cancer   . Hypertension   . Stroke [facility])    TIA no deficits    Past Surgical History:  Procedure Laterality Date  . BREAST BIOPSY Right   . COLONOSCOPY  07/13/2011   Procedure: COLONOSCOPY;  Surgeon: Gatha Mayer, MD;  Location: [place] [facility];  Service: Endoscopy;  Laterality: N/A;  . DILATION AND CURETTAGE OF UTERUS  abortion  . DILITATION & CURRETTAGE/HYSTROSCOPY WITH NOVASURE ABLATION N/A 09/28/2012   Procedure: DILATATION & CURETTAGE/HYSTEROSCOPY WITH NOVASURE ABLATION; And Resectoscope;  Surgeon: Marvene Staff, MD;  Location: [facility];  Service: Gynecology;  Laterality: N/A;  . ESOPHAGOGASTRODUODENOSCOPY  07/13/2011   Procedure: ESOPHAGOGASTRODUODENOSCOPY (EGD);  Surgeon: Gatha Mayer, MD;  Location: [facility] ENDOSCOPY;  Service: Endoscopy;  Laterality: N/A;  . PORTACATH PLACEMENT N/A 01/02/2020   Procedure: INSERTION PORT-A-CATH;  Surgeon: Stark Klein, MD;  Location: [place];  Service: General;  Laterality: N/A;    Family History  Problem Relation Age of Onset  . Breast cancer Half-Sister 50  . Liver cancer Father        dx early 70s  . Breast cancer Maternal Aunt        dx 60s  . Colon cancer Maternal Aunt        dx 56s  . Cancer Paternal Uncle        unknown type, dx >50  Social History   Socioeconomic History  . Marital status: Single    Spouse name: Not on file  . Number of children: Not on file  . Years of education: Not on file  . Highest education level: Not on file  Occupational History  . Not on file  Tobacco Use  . Smoking status: Never Smoker  . Smokeless tobacco: Never Used  Vaping Use  . Vaping Use: Never used  Substance and Sexual Activity  . Alcohol use: Yes    Alcohol/week: 3.0 standard drinks    Types: 3 Cans of beer per week    Comment: twice a week  . Drug use: No  . Sexual  activity: Yes    Birth control/protection: Condom  Other Topics Concern  . Not on file  Social History Narrative  . Not on file   Social Determinants of Health   Financial Resource Strain: Not on file  Food Insecurity: Not on file  Transportation Needs: Not on file  Physical Activity: Not on file  Stress: Not on file  Social Connections: Not on file  Intimate Partner Violence: Not on file    Past Medical History, Surgical history, Social history, and Family history were reviewed and updated as appropriate.   Please see review of systems for further details on the patient's review from today.   Review of Systems:  Review of Systems  Constitutional: Negative for chills, diaphoresis and fever.  HENT: Negative for trouble swallowing and voice change.   Respiratory: Negative for cough, chest tightness, shortness of breath and wheezing.   Cardiovascular: Negative for chest pain and palpitations.  Gastrointestinal: Negative for abdominal pain, constipation, diarrhea, nausea and vomiting.  Musculoskeletal: Negative for back pain and myalgias.  Neurological: Negative for dizziness, light-headedness and headaches.    Objective:   Physical Exam:  BP (!) 141/92 (BP Location: Right Arm, Patient Position: Sitting)   Pulse 91   Temp (!) 97.5 F (36.4 C) (Tympanic)   Resp 16   Ht 5\' 6"  (1.676 m)   Wt 272 lb 9.6 oz (123.7 kg)   SpO2 100%   BMI 44.00 kg/m  ECOG: 0  Physical Exam Constitutional:      General: She is not in acute distress.    Appearance: She is not diaphoretic.  HENT:     Head: Normocephalic and atraumatic.  Eyes:     General: No scleral icterus.       Right eye: No discharge.        Left eye: No discharge.     Conjunctiva/sclera: Conjunctivae normal.  Cardiovascular:     Rate and Rhythm: Normal rate and regular rhythm.     Heart sounds: Normal heart sounds. No murmur heard. No friction rub. No gallop.   Pulmonary:     Effort: Pulmonary effort is normal.  No respiratory distress.     Breath sounds: Normal breath sounds. No wheezing or rales.  Skin:    General: Skin is warm and dry.     Findings: No erythema or rash.  Neurological:     Mental Status: She is alert.     Coordination: Coordination normal.     Gait: Gait normal.  Psychiatric:        Mood and Affect: Mood normal.        Behavior: Behavior normal.        Thought Content: Thought content normal.        Judgment: Judgment normal.     Lab Review:  Component Value Date/Time   NA 140 04/03/2020 1037   NA 139 06/20/2019 1716   K 3.7 04/03/2020 1037   CL 106 04/03/2020 1037   CO2 27 04/03/2020 1037   GLUCOSE 114 (H) 04/03/2020 1037   BUN 10 04/03/2020 1037   BUN 13 06/20/2019 1716   CREATININE 0.70 04/03/2020 1037   CALCIUM 9.6 04/03/2020 1037   PROT 7.5 04/03/2020 1037   PROT 7.4 06/20/2019 1716   ALBUMIN 4.0 04/03/2020 1037   ALBUMIN 4.6 06/20/2019 1716   AST 18 04/03/2020 1037   ALT 23 04/03/2020 1037   ALKPHOS 93 04/03/2020 1037   BILITOT 0.6 04/03/2020 1037   GFRNONAA >60 04/03/2020 1037   GFRAA 117 06/20/2019 1716       Component Value Date/Time   WBC 5.5 04/03/2020 1037   WBC 7.4 09/26/2012 1520   RBC 3.79 (L) 04/03/2020 1037   HGB 12.0 04/03/2020 1037   HGB 14.0 09/04/2018 1454   HCT 36.2 04/03/2020 1037   HCT 42.5 09/04/2018 1454   PLT 375 04/03/2020 1037   PLT 347 09/04/2018 1454   MCV 95.5 04/03/2020 1037   MCV 89 09/04/2018 1454   MCH 31.7 04/03/2020 1037   MCHC 33.1 04/03/2020 1037   RDW 19.3 (H) 04/03/2020 1037   RDW 14.6 09/04/2018 1454   LYMPHSABS 1.8 04/03/2020 1037   MONOABS 0.8 04/03/2020 1037   EOSABS 0.3 04/03/2020 1037   BASOSABS 0.0 04/03/2020 1037   -------------------------------  Imaging from last 24 hours (if applicable):  Radiology interpretation: US BREAST LTD UNI RIGHT INC AXILLA  Result Date: 03/27/2020 CLINICAL DATA:  50 year old female with invasive mammary carcinoma of the right breast diagnosed October 2021  post ultrasound-guided biopsy of a 4.3 x 3.0 x 3.4 cm mass at the 11:30 position and an additional 1.9 x 1.4 x 1.5 cm mass the 12 o'clock position. A biopsied right axillary lymph node demonstrated benign pathology however this was felt to be discordant. Patient presents today to assess response to chemotherapy. EXAM: ULTRASOUND OF THE RIGHT BREAST COMPARISON:  Previous exams. FINDINGS: Targeted ultrasound of the right breast was performed. The dominant mass in the right breast at the 11:30 position now measures 1.8 x 1.8 x 2 cm, previously 4.3 x 3 x 3.4 cm. The mass in the right breast at 12 o'clock 5 cm from nipple now measures 0.6 x 0.3 x 0.8 cm, previously 1.4 x 1.9 x 1.5 cm. The previously biopsied lymph node in the right axilla demonstrates decreased cortical thickening, with a cortex thickness now measuring 0.4 cm, previously measured 0.8 cm. IMPRESSION: 1. Findings compatible with response to treatment, with decreased size of the right breast masses. Dominant right breast mass at the 11:30 position now measures 1.8 x 1.8 x 2 cm, previously 4.3 x 3 x 3.4 cm. Mass at the 12 o'clock position now measures 0.6 x 0.3 x 0.8 cm, previously 1.4 x 1.9 x 1.5 cm. 2. Decreased cortical thickness/normalization of the previously biopsied right axillary lymph node (this was biopsied with benign results however felt to be discordant and suspicious for nodal metastases). RECOMMENDATION: Treatment plan for known right breast malignancy. I have discussed the findings and recommendations with the patient. If applicable, a reminder letter will be sent to the patient regarding the next appointment. BI-RADS CATEGORY  6: Known biopsy-proven malignancy. Electronically Signed   By: Everlean Alstrom M.D.   On: 03/27/2020 13:02   VAS [place] UPPER EXTREMITY VENOUS DUPLEX  Result Date: 03/06/2020 UPPER VENOUS STUDY  Indications: LT arm swelling and pain Risk Factors: Cancer - malignant neoplasm RT breast. Comparison Study: No prior  studies. Performing Technologist: Darlin Coco RDMS  Examination Guidelines: A complete evaluation includes B-mode imaging, spectral Doppler, color Doppler, and power Doppler as needed of all accessible portions of each vessel. Bilateral testing is considered an integral part of a complete examination. Limited examinations for reoccurring indications may be performed as noted.  Right Findings: +----------+------------+---------+-----------+----------+-------+ RIGHT     CompressiblePhasicitySpontaneousPropertiesSummary +----------+------------+---------+-----------+----------+-------+ Subclavian    Full       Yes       Yes                      +----------+------------+---------+-----------+----------+-------+  Left Findings: +----------+------------+---------+-----------+----------+-------+ LEFT      CompressiblePhasicitySpontaneousPropertiesSummary +----------+------------+---------+-----------+----------+-------+ IJV           None       No        No                Acute  +----------+------------+---------+-----------+----------+-------+ Subclavian  Partial      Yes       Yes               Acute  +----------+------------+---------+-----------+----------+-------+ Axillary      None       No        No                Acute  +----------+------------+---------+-----------+----------+-------+ Brachial      None       No        No                Acute  +----------+------------+---------+-----------+----------+-------+ Radial        None       No        No                Acute  +----------+------------+---------+-----------+----------+-------+ Ulnar         None       No        No                Acute  +----------+------------+---------+-----------+----------+-------+ Cephalic      Full       Yes       Yes                      +----------+------------+---------+-----------+----------+-------+ Basilic       None       No        No                 Acute  +----------+------------+---------+-----------+----------+-------+  Summary:  Right: No evidence of thrombosis in the subclavian.  Left: Findings consistent with acute deep vein thrombosis involving the left internal jugular vein, left subclavian vein, left axillary vein, left brachial veins, left radial veins and left ulnar veins. Findings consistent with acute superficial vein thrombosis involving the left basilic vein.  *See table(s) above for measurements and observations.  Diagnosing physician: Harold Barban MD Electronically signed by Harold Barban MD on 03/06/2020 at 3:37:33 PM.    Final       OK to treat.  Sandi Mealy, MHS, PA-C Physician Assistant

## 2020-04-03 NOTE — Patient Instructions (Signed)
Cancer Center Discharge Instructions for Patients Receiving Chemotherapy  Today you received the following chemotherapy agents: paclitaxel.  To help prevent nausea and vomiting after your treatment, we encourage you to take your nausea medication as directed.   If you develop nausea and vomiting that is not controlled by your nausea medication, call the clinic.   BELOW ARE SYMPTOMS THAT SHOULD BE REPORTED IMMEDIATELY:  *FEVER GREATER THAN 100.5 F  *CHILLS WITH OR WITHOUT FEVER  NAUSEA AND VOMITING THAT IS NOT CONTROLLED WITH YOUR NAUSEA MEDICATION  *UNUSUAL SHORTNESS OF BREATH  *UNUSUAL BRUISING OR BLEEDING  TENDERNESS IN MOUTH AND THROAT WITH OR WITHOUT PRESENCE OF ULCERS  *URINARY PROBLEMS  *BOWEL PROBLEMS  UNUSUAL RASH Items with * indicate a potential emergency and should be followed up as soon as possible.  Feel free to call the clinic should you have any questions or concerns. The clinic phone number is [phone].  Please show the CHEMO ALERT CARD at check-in to the Emergency Department and triage nurse.   

## 2020-04-03 NOTE — Progress Notes (Signed)
No blood return from port. Labs drawn peripherally. Cathflo administered by Delle Reining, RN

## 2020-04-07 ENCOUNTER — Encounter: Payer: Self-pay | Admitting: *Deleted

## 2020-04-10 ENCOUNTER — Other Ambulatory Visit: Payer: Self-pay

## 2020-04-10 ENCOUNTER — Encounter: Payer: Self-pay | Admitting: *Deleted

## 2020-04-10 ENCOUNTER — Inpatient Hospital Stay: Payer: BC Managed Care – PPO

## 2020-04-10 VITALS — BP 114/77 | HR 90 | Temp 97.8°F | Resp 16 | Wt 267.5 lb

## 2020-04-10 DIAGNOSIS — F32A Depression, unspecified: Secondary | ICD-10-CM | POA: Diagnosis not present

## 2020-04-10 DIAGNOSIS — C50411 Malignant neoplasm of upper-outer quadrant of right female breast: Secondary | ICD-10-CM | POA: Diagnosis not present

## 2020-04-10 DIAGNOSIS — Z170 Estrogen receptor positive status [ER+]: Secondary | ICD-10-CM

## 2020-04-10 DIAGNOSIS — Z8673 Personal history of transient ischemic attack (TIA), and cerebral infarction without residual deficits: Secondary | ICD-10-CM | POA: Diagnosis not present

## 2020-04-10 DIAGNOSIS — I82622 Acute embolism and thrombosis of deep veins of left upper extremity: Secondary | ICD-10-CM | POA: Diagnosis not present

## 2020-04-10 DIAGNOSIS — J45909 Unspecified asthma, uncomplicated: Secondary | ICD-10-CM | POA: Diagnosis not present

## 2020-04-10 DIAGNOSIS — I10 Essential (primary) hypertension: Secondary | ICD-10-CM | POA: Diagnosis not present

## 2020-04-10 DIAGNOSIS — Z95828 Presence of other vascular implants and grafts: Secondary | ICD-10-CM

## 2020-04-10 DIAGNOSIS — E119 Type 2 diabetes mellitus without complications: Secondary | ICD-10-CM | POA: Diagnosis not present

## 2020-04-10 DIAGNOSIS — Z5111 Encounter for antineoplastic chemotherapy: Secondary | ICD-10-CM | POA: Diagnosis not present

## 2020-04-10 LAB — CBC WITH DIFFERENTIAL (CANCER CENTER ONLY)
Abs Immature Granulocytes: 0.03 10*3/uL (ref 0.00–0.07)
Basophils Absolute: 0 10*3/uL (ref 0.0–0.1)
Basophils Relative: 1 %
Eosinophils Absolute: 0.3 10*3/uL (ref 0.0–0.5)
Eosinophils Relative: 6 %
HCT: 33.8 % — ABNORMAL LOW (ref 36.0–46.0)
Hemoglobin: 11.4 g/dL — ABNORMAL LOW (ref 12.0–15.0)
Immature Granulocytes: 1 %
Lymphocytes Relative: 33 %
Lymphs Abs: 1.6 10*3/uL (ref 0.7–4.0)
MCH: 31.8 pg (ref 26.0–34.0)
MCHC: 33.7 g/dL (ref 30.0–36.0)
MCV: 94.4 fL (ref 80.0–100.0)
Monocytes Absolute: 0.4 10*3/uL (ref 0.1–1.0)
Monocytes Relative: 9 %
Neutro Abs: 2.4 10*3/uL (ref 1.7–7.7)
Neutrophils Relative %: 50 %
Platelet Count: 318 10*3/uL (ref 150–400)
RBC: 3.58 MIL/uL — ABNORMAL LOW (ref 3.87–5.11)
RDW: 16.8 % — ABNORMAL HIGH (ref 11.5–15.5)
WBC Count: 4.7 10*3/uL (ref 4.0–10.5)
nRBC: 0 % (ref 0.0–0.2)

## 2020-04-10 LAB — CMP (CANCER CENTER ONLY)
ALT: 21 U/L (ref 0–44)
AST: 22 U/L (ref 15–41)
Albumin: 3.7 g/dL (ref 3.5–5.0)
Alkaline Phosphatase: 103 U/L (ref 38–126)
Anion gap: 9 (ref 5–15)
BUN: 11 mg/dL (ref 6–20)
CO2: 25 mmol/L (ref 22–32)
Calcium: 9.8 mg/dL (ref 8.9–10.3)
Chloride: 103 mmol/L (ref 98–111)
Creatinine: 0.66 mg/dL (ref 0.44–1.00)
GFR, Estimated: >60 mL/min (ref >60)
Glucose, Bld: 139 mg/dL — ABNORMAL HIGH (ref 70–99)
Potassium: 3.7 mmol/L (ref 3.5–5.1)
Sodium: 137 mmol/L (ref 135–145)
Total Bilirubin: 0.9 mg/dL (ref 0.3–1.2)
Total Protein: 7.3 g/dL (ref 6.5–8.1)

## 2020-04-10 MED ORDER — PALONOSETRON HCL INJECTION 0.25 MG/5ML
0.2500 mg | Freq: Once | INTRAVENOUS | Status: AC
  Administered 2020-04-10: 0.25 mg via INTRAVENOUS

## 2020-04-10 MED ORDER — SODIUM CHLORIDE 0.9 % IV SOLN
Freq: Once | INTRAVENOUS | Status: AC
  Filled 2020-04-10: qty 250

## 2020-04-10 MED ORDER — SODIUM CHLORIDE 0.9 % IV SOLN
10.0000 mg | Freq: Once | INTRAVENOUS | Status: AC
  Administered 2020-04-10: 10 mg via INTRAVENOUS
  Filled 2020-04-10: qty 10

## 2020-04-10 MED ORDER — SODIUM CHLORIDE 0.9 % IV SOLN
150.0000 mg | Freq: Once | INTRAVENOUS | Status: AC
  Administered 2020-04-10: 150 mg via INTRAVENOUS
  Filled 2020-04-10: qty 150

## 2020-04-10 MED ORDER — PALONOSETRON HCL INJECTION 0.25 MG/5ML
INTRAVENOUS | Status: AC
  Filled 2020-04-10: qty 5

## 2020-04-10 MED ORDER — FAMOTIDINE IN NACL 20-0.9 MG/50ML-% IV SOLN
INTRAVENOUS | Status: AC
  Filled 2020-04-10: qty 50

## 2020-04-10 MED ORDER — SODIUM CHLORIDE 0.9 % IV SOLN
80.0000 mg/m2 | Freq: Once | INTRAVENOUS | Status: AC
  Administered 2020-04-10: 192 mg via INTRAVENOUS
  Filled 2020-04-10: qty 32

## 2020-04-10 MED ORDER — DIPHENHYDRAMINE HCL 50 MG/ML IJ SOLN
INTRAMUSCULAR | Status: AC
  Filled 2020-04-10: qty 1

## 2020-04-10 MED ORDER — SODIUM CHLORIDE 0.9% FLUSH
10.0000 mL | INTRAVENOUS | Status: DC | PRN
  Administered 2020-04-10: 10 mL
  Filled 2020-04-10: qty 10

## 2020-04-10 MED ORDER — FAMOTIDINE IN NACL 20-0.9 MG/50ML-% IV SOLN
20.0000 mg | Freq: Once | INTRAVENOUS | Status: AC
  Administered 2020-04-10: 20 mg via INTRAVENOUS

## 2020-04-10 MED ORDER — DIPHENHYDRAMINE HCL 50 MG/ML IJ SOLN
25.0000 mg | Freq: Once | INTRAMUSCULAR | Status: AC
  Administered 2020-04-10: 25 mg via INTRAVENOUS

## 2020-04-10 MED ORDER — SODIUM CHLORIDE 0.9% FLUSH
10.0000 mL | Freq: Once | INTRAVENOUS | Status: AC
  Administered 2020-04-10: 10 mL
  Filled 2020-04-10: qty 10

## 2020-04-10 MED ORDER — CARBOPLATIN CHEMO INJECTION 600 MG/60ML
700.0000 mg | Freq: Once | INTRAVENOUS | Status: AC
Start: 2020-04-10 — End: 2020-04-10
  Administered 2020-04-10: 700 mg via INTRAVENOUS
  Filled 2020-04-10: qty 70

## 2020-04-10 MED ORDER — HEPARIN SOD (PORK) LOCK FLUSH 100 UNIT/ML IV SOLN
500.0000 [IU] | Freq: Once | INTRAVENOUS | Status: AC | PRN
  Administered 2020-04-10: 500 [IU]
  Filled 2020-04-10: qty 5

## 2020-04-10 NOTE — Patient Instructions (Signed)
Implanted Port Insertion, Care After This sheet gives you information about how to care for yourself after your procedure. Your health care provider may also give you more specific instructions. If you have problems or questions, contact your health care provider. What can I expect after the procedure? After the procedure, it is common to have:  Discomfort at the port insertion site.  Bruising on the skin over the port. This should improve over 3-4 days. Follow these instructions at home: Port care  After your port is placed, you will get a manufacturer's information card. The card has information about your port. Keep this card with you at all times.  Take care of the port as told by your health care provider. Ask your health care provider if you or a family member can get training for taking care of the port at home. A home health care nurse may also take care of the port.  Make sure to remember what type of port you have. Incision care  Follow instructions from your health care provider about how to take care of your port insertion site. Make sure you: ? Wash your hands with soap and water before and after you change your bandage (dressing). If soap and water are not available, use hand sanitizer. ? Change your dressing as told by your health care provider. ? Leave stitches (sutures), skin glue, or adhesive strips in place. These skin closures may need to stay in place for 2 weeks or longer. If adhesive strip edges start to loosen and curl up, you may trim the loose edges. Do not remove adhesive strips completely unless your health care provider tells you to do that.  Check your port insertion site every day for signs of infection. Check for: ? Redness, swelling, or pain. ? Fluid or blood. ? Warmth. ? Pus or a bad smell.      Activity  Return to your normal activities as told by your health care provider. Ask your health care provider what activities are safe for you.  Do not  lift anything that is heavier than 10 lb (4.5 kg), or the limit that you are told, until your health care provider says that it is safe. General instructions  Take over-the-counter and prescription medicines only as told by your health care provider.  Do not take baths, swim, or use a hot tub until your health care provider approves. Ask your health care provider if you may take showers. You may only be allowed to take sponge baths.  Do not drive for 24 hours if you were given a sedative during your procedure.  Wear a medical alert bracelet in case of an emergency. This will tell any health care providers that you have a port.  Keep all follow-up visits as told by your health care provider. This is important. Contact a health care provider if:  You cannot flush your port with saline as directed, or you cannot draw blood from the port.  You have a fever or chills.  You have redness, swelling, or pain around your port insertion site.  You have fluid or blood coming from your port insertion site.  Your port insertion site feels warm to the touch.  You have pus or a bad smell coming from the port insertion site. Get help right away if:  You have chest pain or shortness of breath.  You have bleeding from your port that you cannot control. Summary  Take care of the port as told by your   health care provider. Keep the manufacturer's information card with you at all times.  Change your dressing as told by your health care provider.  Contact a health care provider if you have a fever or chills or if you have redness, swelling, or pain around your port insertion site.  Keep all follow-up visits as told by your health care provider. This information is not intended to replace advice given to you by your health care provider. Make sure you discuss any questions you have with your health care provider. Document Revised: 09/13/2017 Document Reviewed: 09/13/2017 Elsevier Patient Education   2021 [employer] Inc.  

## 2020-04-10 NOTE — Patient Instructions (Signed)
[facility] Discharge Instructions for Patients Receiving Chemotherapy  Today you received the following chemotherapy agents: paclitaxel, carboplatin.  To help prevent nausea and vomiting after your treatment, we encourage you to take your nausea medication as directed.    If you develop nausea and vomiting that is not controlled by your nausea medication, call the clinic.   BELOW ARE SYMPTOMS THAT SHOULD BE REPORTED IMMEDIATELY:  *FEVER GREATER THAN 100.5 F  *CHILLS WITH OR WITHOUT FEVER  NAUSEA AND VOMITING THAT IS NOT CONTROLLED WITH YOUR NAUSEA MEDICATION  *UNUSUAL SHORTNESS OF BREATH  *UNUSUAL BRUISING OR BLEEDING  TENDERNESS IN MOUTH AND THROAT WITH OR WITHOUT PRESENCE OF ULCERS  *URINARY PROBLEMS  *BOWEL PROBLEMS  UNUSUAL RASH Items with * indicate a potential emergency and should be followed up as soon as possible.  Feel free to call the clinic should you have any questions or concerns. The clinic phone number is [phone].  Please show the CHEMO ALERT CARD at check-in to the Emergency Department and triage nurse.   

## 2020-04-10 NOTE — Progress Notes (Signed)
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 This is new from her last treatment.  Cathy Parrish states the numbness "comes and goes". Dr. Lindi Adie made aware. Per Dr. Lindi Adie, ok to proceed with treatment today without dose reduction. Cathy Parrish scheduled to see MD prior to next infusion and neuropathy will be reassessed at that time.

## 2020-04-17 ENCOUNTER — Other Ambulatory Visit: Payer: Self-pay | Admitting: Hematology and Oncology

## 2020-04-17 ENCOUNTER — Inpatient Hospital Stay: Payer: BC Managed Care – PPO

## 2020-04-17 ENCOUNTER — Other Ambulatory Visit: Payer: Self-pay

## 2020-04-17 ENCOUNTER — Inpatient Hospital Stay: Payer: BC Managed Care – PPO | Admitting: Medical

## 2020-04-17 VITALS — BP 111/74 | HR 107 | Temp 98.5°F | Resp 17 | Ht 66.0 in | Wt 260.5 lb

## 2020-04-17 VITALS — HR 98

## 2020-04-17 DIAGNOSIS — I82622 Acute embolism and thrombosis of deep veins of left upper extremity: Secondary | ICD-10-CM | POA: Diagnosis not present

## 2020-04-17 DIAGNOSIS — C50411 Malignant neoplasm of upper-outer quadrant of right female breast: Secondary | ICD-10-CM

## 2020-04-17 DIAGNOSIS — Z8673 Personal history of transient ischemic attack (TIA), and cerebral infarction without residual deficits: Secondary | ICD-10-CM | POA: Diagnosis not present

## 2020-04-17 DIAGNOSIS — Z170 Estrogen receptor positive status [ER+]: Secondary | ICD-10-CM

## 2020-04-17 DIAGNOSIS — Z95828 Presence of other vascular implants and grafts: Secondary | ICD-10-CM

## 2020-04-17 DIAGNOSIS — I10 Essential (primary) hypertension: Secondary | ICD-10-CM | POA: Diagnosis not present

## 2020-04-17 DIAGNOSIS — E119 Type 2 diabetes mellitus without complications: Secondary | ICD-10-CM | POA: Diagnosis not present

## 2020-04-17 DIAGNOSIS — J45909 Unspecified asthma, uncomplicated: Secondary | ICD-10-CM | POA: Diagnosis not present

## 2020-04-17 DIAGNOSIS — F32A Depression, unspecified: Secondary | ICD-10-CM | POA: Diagnosis not present

## 2020-04-17 DIAGNOSIS — Z5111 Encounter for antineoplastic chemotherapy: Secondary | ICD-10-CM | POA: Diagnosis not present

## 2020-04-17 LAB — CBC WITH DIFFERENTIAL (CANCER CENTER ONLY)
Abs Immature Granulocytes: 0.01 10*3/uL (ref 0.00–0.07)
Basophils Absolute: 0 10*3/uL (ref 0.0–0.1)
Basophils Relative: 1 %
Eosinophils Absolute: 0 10*3/uL (ref 0.0–0.5)
Eosinophils Relative: 1 %
HCT: 33 % — ABNORMAL LOW (ref 36.0–46.0)
Hemoglobin: 11.7 g/dL — ABNORMAL LOW (ref 12.0–15.0)
Immature Granulocytes: 0 %
Lymphocytes Relative: 37 %
Lymphs Abs: 1.2 10*3/uL (ref 0.7–4.0)
MCH: 32.6 pg (ref 26.0–34.0)
MCHC: 35.5 g/dL (ref 30.0–36.0)
MCV: 91.9 fL (ref 80.0–100.0)
Monocytes Absolute: 0.3 10*3/uL (ref 0.1–1.0)
Monocytes Relative: 8 %
Neutro Abs: 1.6 10*3/uL — ABNORMAL LOW (ref 1.7–7.7)
Neutrophils Relative %: 53 %
Platelet Count: 313 10*3/uL (ref 150–400)
RBC: 3.59 MIL/uL — ABNORMAL LOW (ref 3.87–5.11)
RDW: 15.3 % (ref 11.5–15.5)
WBC Count: 3.1 10*3/uL — ABNORMAL LOW (ref 4.0–10.5)
nRBC: 0 % (ref 0.0–0.2)

## 2020-04-17 LAB — CMP (CANCER CENTER ONLY)
ALT: 18 U/L (ref 0–44)
AST: 18 U/L (ref 15–41)
Albumin: 4.1 g/dL (ref 3.5–5.0)
Alkaline Phosphatase: 103 U/L (ref 38–126)
Anion gap: 11 (ref 5–15)
BUN: 11 mg/dL (ref 6–20)
CO2: 26 mmol/L (ref 22–32)
Calcium: 9.2 mg/dL (ref 8.9–10.3)
Chloride: 97 mmol/L — ABNORMAL LOW (ref 98–111)
Creatinine: 0.96 mg/dL (ref 0.44–1.00)
GFR, Estimated: >60 mL/min (ref >60)
Glucose, Bld: 139 mg/dL — ABNORMAL HIGH (ref 70–99)
Potassium: 3.1 mmol/L — ABNORMAL LOW (ref 3.5–5.1)
Sodium: 134 mmol/L — ABNORMAL LOW (ref 135–145)
Total Bilirubin: 0.7 mg/dL (ref 0.3–1.2)
Total Protein: 7.8 g/dL (ref 6.5–8.1)

## 2020-04-17 MED ORDER — FAMOTIDINE IN NACL 20-0.9 MG/50ML-% IV SOLN
20.0000 mg | Freq: Once | INTRAVENOUS | Status: AC
  Administered 2020-04-17: 20 mg via INTRAVENOUS

## 2020-04-17 MED ORDER — OXYCODONE HCL 5 MG PO TABS: 5.0000 mg | ORAL_TABLET | Freq: Four times a day (QID) | ORAL | 0 refills | Status: DC | PRN

## 2020-04-17 MED ORDER — ALUM & MAG HYDROXIDE-SIMETH 200-200-20 MG/5ML PO SUSP
ORAL | Status: AC
  Filled 2020-04-17: qty 30

## 2020-04-17 MED ORDER — ALTEPLASE 2 MG IJ SOLR
2.0000 mg | Freq: Once | INTRAMUSCULAR | Status: AC
  Administered 2020-04-17: 2 mg
  Filled 2020-04-17: qty 2

## 2020-04-17 MED ORDER — DIPHENHYDRAMINE HCL 50 MG/ML IJ SOLN
INTRAMUSCULAR | Status: AC
  Filled 2020-04-17: qty 1

## 2020-04-17 MED ORDER — ALTEPLASE 2 MG IJ SOLR
INTRAMUSCULAR | Status: AC
  Filled 2020-04-17: qty 2

## 2020-04-17 MED ORDER — SODIUM CHLORIDE 0.9 % IV SOLN
20.0000 mg | Freq: Once | INTRAVENOUS | Status: AC
  Administered 2020-04-17: 20 mg via INTRAVENOUS
  Filled 2020-04-17: qty 20

## 2020-04-17 MED ORDER — SODIUM CHLORIDE 0.9% FLUSH
10.0000 mL | Freq: Once | INTRAVENOUS | Status: AC
  Administered 2020-04-17: 10 mL
  Filled 2020-04-17: qty 10

## 2020-04-17 MED ORDER — POTASSIUM CHLORIDE CRYS ER 20 MEQ PO TBCR: 20.0000 meq | EXTENDED_RELEASE_TABLET | Freq: Once | ORAL | 0 refills | Status: DC

## 2020-04-17 MED ORDER — DIPHENHYDRAMINE HCL 50 MG/ML IJ SOLN
25.0000 mg | Freq: Once | INTRAMUSCULAR | Status: AC
  Administered 2020-04-17: 25 mg via INTRAVENOUS

## 2020-04-17 MED ORDER — ALPRAZOLAM 0.5 MG PO TABS: ORAL_TABLET | ORAL | 0 refills | Status: DC

## 2020-04-17 MED ORDER — FAMOTIDINE IN NACL 20-0.9 MG/50ML-% IV SOLN
INTRAVENOUS | Status: AC
  Filled 2020-04-17: qty 50

## 2020-04-17 MED ORDER — SODIUM CHLORIDE 0.9 % IV SOLN
80.0000 mg/m2 | Freq: Once | INTRAVENOUS | Status: AC
  Administered 2020-04-17: 192 mg via INTRAVENOUS
  Filled 2020-04-17: qty 32

## 2020-04-17 NOTE — Progress Notes (Signed)
No nurse assessment, Patient seen and assessed by Sandi Mealy PA, Nira Conn

## 2020-04-17 NOTE — Progress Notes (Signed)
OK to treat.  Brailynn Breth, MHS, PA-C Physician Assistant 

## 2020-04-17 NOTE — Patient Instructions (Signed)

## 2020-04-17 NOTE — Patient Instructions (Signed)
[facility] Discharge Instructions for Patients Receiving Chemotherapy  Today you received the following chemotherapy agents: paclitaxel.  To help prevent nausea and vomiting after your treatment, we encourage you to take your nausea medication as directed.   If you develop nausea and vomiting that is not controlled by your nausea medication, call the clinic.   BELOW ARE SYMPTOMS THAT SHOULD BE REPORTED IMMEDIATELY:  *FEVER GREATER THAN 100.5 F  *CHILLS WITH OR WITHOUT FEVER  NAUSEA AND VOMITING THAT IS NOT CONTROLLED WITH YOUR NAUSEA MEDICATION  *UNUSUAL SHORTNESS OF BREATH  *UNUSUAL BRUISING OR BLEEDING  TENDERNESS IN MOUTH AND THROAT WITH OR WITHOUT PRESENCE OF ULCERS  *URINARY PROBLEMS  *BOWEL PROBLEMS  UNUSUAL RASH Items with * indicate a potential emergency and should be followed up as soon as possible.  Feel free to call the clinic should you have any questions or concerns. The clinic phone number is [phone].  Please show the CHEMO ALERT CARD at check-in to the Emergency Department and triage nurse.   

## 2020-04-18 MED ORDER — ACETAMINOPHEN 325 MG PO TABS
ORAL_TABLET | ORAL | Status: AC
  Filled 2020-04-18: qty 2

## 2020-04-18 NOTE — Progress Notes (Signed)
[facility] Progress Note   Cathy Parrish [id number] 1970/09/10 50 y.o.  Cathy Parrish is managed by Dr. Nicholas Lose  Actively treated with chemotherapy/immunotherapy/hormonal therapy: yes  Current therapy: Paclitaxel  Last treated: 04/10/2020 (cycle #6, day 1)  Next scheduled appointment with provider: 05/01/2020  Assessment: Plan:    No diagnosis found.   ER positive malignant neoplasm of the right breast: [name] continues to be followed by Dr. Nicholas Lose and presents to the clinic today for consideration of cycle 6 day 8 of paclitaxel.  We will proceed with her treatment today and will have her return to clinic on 05/01/2020 for follow-up.  Please see After Visit Summary for patient specific instructions.  Future Appointments  Date Time Provider [place]  04/25/2020  8:00 AM [facility] LAB [facility] None  04/25/2020  8:15 AM [facility] FLUSH [facility] None  04/25/2020  9:00 AM [facility] INFUSION [facility] None  05/01/2020 11:15 AM [facility] LAB [facility] None  05/01/2020 11:30 AM [facility] [place] FLUSH [facility] None  05/01/2020 12:00 PM Nicholas Lose, MD [facility] None  05/01/2020  1:00 PM [facility] INFUSION [facility] None  05/08/2020  8:30 AM [facility] LAB [facility] None  05/08/2020  8:45 AM [facility] [place] FLUSH [facility] None  05/08/2020  9:30 AM [facility] INFUSION [facility] None  05/15/2020  8:00 AM [facility] LAB [facility] None  05/15/2020  8:15 AM [facility] [place] FLUSH [facility] None  05/15/2020  8:45 AM Nicholas Lose, MD [facility] None  05/15/2020  9:45 AM [facility] INFUSION [facility] None    No orders of the defined types were placed in this encounter.      Subjective:   Patient ID:  Cathy Parrish is a 50 y.o. (DOB 1970/10/14) female.  Chief Complaint: No chief complaint on file.   HPI Cathy Parrish  is a 50 y.o. female with a diagnosis of an ER positive malignant neoplasm of the right breast.   She is managed by Dr. Nicholas Lose and presents to the clinic today for consideration of cycle 6, day 8 of paclitaxel.  She reports that she continues to do well with no acute issues of concern except for some lower extremity neuropathy on Monday and Tuesday of this past week which is now resolved. She reports her energy level is a little low.  She denies fevers, chills, sweats, nausea, vomiting, constipation, or diarrhea.   Medications: I have reviewed the patient's current medications.  Allergies: No Known Allergies  Past Medical History:  Diagnosis Date  . Asthma   . Breast cancer ([place]) 11/2019  . Depression   . Diabetes mellitus without complication (HCC)    diet controlled  . Family history of breast cancer   . Family history of colon cancer   . Family history of liver cancer   . Hypertension   . Stroke [facility])    TIA no deficits    Past Surgical History:  Procedure Laterality Date  . BREAST BIOPSY Right   . COLONOSCOPY  07/13/2011   Procedure: COLONOSCOPY;  Surgeon: Gatha Mayer, MD;  Location: [facility];  Service: Endoscopy;  Laterality: N/A;  . DILATION AND CURETTAGE OF UTERUS  abortion  . DILITATION & CURRETTAGE/HYSTROSCOPY WITH NOVASURE ABLATION N/A 09/28/2012   Procedure: DILATATION & CURETTAGE/HYSTEROSCOPY WITH NOVASURE ABLATION; And Resectoscope;  Surgeon: Marvene Staff, MD;  Location: [facility];  Service: Gynecology;  Laterality: N/A;  . ESOPHAGOGASTRODUODENOSCOPY  07/13/2011   Procedure: ESOPHAGOGASTRODUODENOSCOPY (EGD);  Surgeon: Gatha Mayer, MD;  Location: [facility];  Service: Endoscopy;  Laterality: N/A;  . PORTACATH PLACEMENT N/A 01/02/2020   Procedure: INSERTION PORT-A-CATH;  Surgeon: Stark Klein, MD;  Location: [facility];  Service: General;  Laterality: N/A;    Family History  Problem Relation Age of Onset  . Breast cancer Half-Sister 25  . Liver cancer Father        dx early 56s  . Breast cancer Maternal Aunt        dx 30s   . Colon cancer Maternal Aunt        dx 53s  . Cancer Paternal Uncle        unknown type, dx >50    Social History   Socioeconomic History  . Marital status: Single    Spouse name: Not on file  . Number of children: Not on file  . Years of education: Not on file  . Highest education level: Not on file  Occupational History  . Not on file  Tobacco Use  . Smoking status: Never Smoker  . Smokeless tobacco: Never Used  Vaping Use  . Vaping Use: Never used  Substance and Sexual Activity  . Alcohol use: Yes    Alcohol/week: 3.0 standard drinks    Types: 3 Cans of beer per week    Comment: twice a week  . Drug use: No  . Sexual activity: Yes    Birth control/protection: Condom  Other Topics Concern  . Not on file  Social History Narrative  . Not on file   Social Determinants of Health   Financial Resource Strain: Not on file  Food Insecurity: Not on file  Transportation Needs: Not on file  Physical Activity: Not on file  Stress: Not on file  Social Connections: Not on file  Intimate Partner Violence: Not on file    Past Medical History, Surgical history, Social history, and Family history were reviewed and updated as appropriate.   Please see review of systems for further details on the patient's review from today.   Review of Systems:  Review of Systems  Constitutional: Positive for fatigue. Negative for chills, diaphoresis and fever.  HENT: Negative for trouble swallowing and voice change.   Respiratory: Negative for cough, chest tightness, shortness of breath and wheezing.   Cardiovascular: Negative for chest pain and palpitations.  Gastrointestinal: Negative for abdominal pain, constipation, diarrhea, nausea and vomiting.  Musculoskeletal: Negative for back pain and myalgias.  Neurological: Positive for numbness. Negative for dizziness, light-headedness and headaches.    Objective:   Physical Exam:  BP 111/74 (BP Location: Left Arm, Patient Position:  Sitting)   Pulse (!) 107   Temp 98.5 F (36.9 C) (Tympanic)   Resp 17   Ht 5\' 6"  (1.676 m)   Wt 260 lb 8 oz (118.2 kg)   SpO2 100%   BMI 42.05 kg/m  ECOG: 0  Physical Exam Constitutional:      General: She is not in acute distress.    Appearance: She is not diaphoretic.  HENT:     Head: Normocephalic and atraumatic.  Eyes:     General: No scleral icterus.       Right eye: No discharge.        Left eye: No discharge.     Conjunctiva/sclera: Conjunctivae normal.  Cardiovascular:     Rate and Rhythm: Normal rate and regular rhythm.     Heart sounds: Normal heart sounds. No murmur heard. No friction rub. No gallop.   Pulmonary:     Effort: Pulmonary  effort is normal. No respiratory distress.     Breath sounds: Normal breath sounds. No wheezing or rales.  Skin:    General: Skin is warm and dry.     Findings: No erythema or rash.  Neurological:     Mental Status: She is alert.     Coordination: Coordination normal.     Gait: Gait normal.  Psychiatric:        Mood and Affect: Mood normal.        Behavior: Behavior normal.        Thought Content: Thought content normal.        Judgment: Judgment normal.     Lab Review:     Component Value Date/Time   NA 134 (L) 04/17/2020 1325   NA 139 06/20/2019 1716   K 3.1 (L) 04/17/2020 1325   CL 97 (L) 04/17/2020 1325   CO2 26 04/17/2020 1325   GLUCOSE 139 (H) 04/17/2020 1325   BUN 11 04/17/2020 1325   BUN 13 06/20/2019 1716   CREATININE 0.96 04/17/2020 1325   CALCIUM 9.2 04/17/2020 1325   PROT 7.8 04/17/2020 1325   PROT 7.4 06/20/2019 1716   ALBUMIN 4.1 04/17/2020 1325   ALBUMIN 4.6 06/20/2019 1716   AST 18 04/17/2020 1325   ALT 18 04/17/2020 1325   ALKPHOS 103 04/17/2020 1325   BILITOT 0.7 04/17/2020 1325   GFRNONAA >60 04/17/2020 1325   GFRAA 117 06/20/2019 1716       Component Value Date/Time   WBC 3.1 (L) 04/17/2020 1325   WBC 7.4 09/26/2012 1520   RBC 3.59 (L) 04/17/2020 1325   HGB 11.7 (L) 04/17/2020 1325    HGB 14.0 09/04/2018 1454   HCT 33.0 (L) 04/17/2020 1325   HCT 42.5 09/04/2018 1454   PLT 313 04/17/2020 1325   PLT 347 09/04/2018 1454   MCV 91.9 04/17/2020 1325   MCV 89 09/04/2018 1454   MCH 32.6 04/17/2020 1325   MCHC 35.5 04/17/2020 1325   RDW 15.3 04/17/2020 1325   RDW 14.6 09/04/2018 1454   LYMPHSABS 1.2 04/17/2020 1325   MONOABS 0.3 04/17/2020 1325   EOSABS 0.0 04/17/2020 1325   BASOSABS 0.0 04/17/2020 1325   -------------------------------  Imaging from last 24 hours (if applicable):  Radiology interpretation: US BREAST LTD UNI RIGHT INC AXILLA  Result Date: 03/27/2020 CLINICAL DATA:  50 year old female with invasive mammary carcinoma of the right breast diagnosed October 2021 post ultrasound-guided biopsy of a 4.3 x 3.0 x 3.4 cm mass at the 11:30 position and an additional 1.9 x 1.4 x 1.5 cm mass the 12 o'clock position. A biopsied right axillary lymph node demonstrated benign pathology however this was felt to be discordant. Patient presents today to assess response to chemotherapy. EXAM: ULTRASOUND OF THE RIGHT BREAST COMPARISON:  Previous exams. FINDINGS: Targeted ultrasound of the right breast was performed. The dominant mass in the right breast at the 11:30 position now measures 1.8 x 1.8 x 2 cm, previously 4.3 x 3 x 3.4 cm. The mass in the right breast at 12 o'clock 5 cm from nipple now measures 0.6 x 0.3 x 0.8 cm, previously 1.4 x 1.9 x 1.5 cm. The previously biopsied lymph node in the right axilla demonstrates decreased cortical thickening, with a cortex thickness now measuring 0.4 cm, previously measured 0.8 cm. IMPRESSION: 1. Findings compatible with response to treatment, with decreased size of the right breast masses. Dominant right breast mass at the 11:30 position now measures 1.8 x 1.8 x 2 cm,  previously 4.3 x 3 x 3.4 cm. Mass at the 12 o'clock position now measures 0.6 x 0.3 x 0.8 cm, previously 1.4 x 1.9 x 1.5 cm. 2. Decreased cortical thickness/normalization of  the previously biopsied right axillary lymph node (this was biopsied with benign results however felt to be discordant and suspicious for nodal metastases). RECOMMENDATION: Treatment plan for known right breast malignancy. I have discussed the findings and recommendations with the patient. If applicable, a reminder letter will be sent to the patient regarding the next appointment. BI-RADS CATEGORY  6: Known biopsy-proven malignancy. Electronically Signed   By: Everlean Alstrom M.D.   On: 03/27/2020 13:02      OK to treat.  Sandi Mealy, MHS, PA-C Physician Assistant

## 2020-04-25 ENCOUNTER — Inpatient Hospital Stay: Payer: BC Managed Care – PPO

## 2020-04-25 ENCOUNTER — Encounter: Payer: Self-pay | Admitting: Licensed Clinical Social Worker

## 2020-04-25 ENCOUNTER — Other Ambulatory Visit: Payer: Self-pay

## 2020-04-25 VITALS — BP 110/71 | HR 87 | Temp 98.8°F | Resp 18 | Wt 264.8 lb

## 2020-04-25 DIAGNOSIS — E119 Type 2 diabetes mellitus without complications: Secondary | ICD-10-CM | POA: Diagnosis not present

## 2020-04-25 DIAGNOSIS — Z8673 Personal history of transient ischemic attack (TIA), and cerebral infarction without residual deficits: Secondary | ICD-10-CM | POA: Diagnosis not present

## 2020-04-25 DIAGNOSIS — C50411 Malignant neoplasm of upper-outer quadrant of right female breast: Secondary | ICD-10-CM | POA: Diagnosis not present

## 2020-04-25 DIAGNOSIS — I82622 Acute embolism and thrombosis of deep veins of left upper extremity: Secondary | ICD-10-CM | POA: Diagnosis not present

## 2020-04-25 DIAGNOSIS — Z170 Estrogen receptor positive status [ER+]: Secondary | ICD-10-CM

## 2020-04-25 DIAGNOSIS — Z95828 Presence of other vascular implants and grafts: Secondary | ICD-10-CM

## 2020-04-25 DIAGNOSIS — Z5111 Encounter for antineoplastic chemotherapy: Secondary | ICD-10-CM | POA: Diagnosis not present

## 2020-04-25 DIAGNOSIS — J45909 Unspecified asthma, uncomplicated: Secondary | ICD-10-CM | POA: Diagnosis not present

## 2020-04-25 DIAGNOSIS — F32A Depression, unspecified: Secondary | ICD-10-CM | POA: Diagnosis not present

## 2020-04-25 DIAGNOSIS — I10 Essential (primary) hypertension: Secondary | ICD-10-CM | POA: Diagnosis not present

## 2020-04-25 LAB — CBC WITH DIFFERENTIAL (CANCER CENTER ONLY)
Abs Immature Granulocytes: 0.02 10*3/uL (ref 0.00–0.07)
Basophils Absolute: 0 10*3/uL (ref 0.0–0.1)
Basophils Relative: 0 %
Eosinophils Absolute: 0 10*3/uL (ref 0.0–0.5)
Eosinophils Relative: 0 %
HCT: 28.8 % — ABNORMAL LOW (ref 36.0–46.0)
Hemoglobin: 9.9 g/dL — ABNORMAL LOW (ref 12.0–15.0)
Immature Granulocytes: 1 %
Lymphocytes Relative: 39 %
Lymphs Abs: 1.2 10*3/uL (ref 0.7–4.0)
MCH: 32.1 pg (ref 26.0–34.0)
MCHC: 34.4 g/dL (ref 30.0–36.0)
MCV: 93.5 fL (ref 80.0–100.0)
Monocytes Absolute: 0.5 10*3/uL (ref 0.1–1.0)
Monocytes Relative: 17 %
Neutro Abs: 1.3 10*3/uL — ABNORMAL LOW (ref 1.7–7.7)
Neutrophils Relative %: 43 %
Platelet Count: 204 10*3/uL (ref 150–400)
RBC: 3.08 MIL/uL — ABNORMAL LOW (ref 3.87–5.11)
RDW: 15.3 % (ref 11.5–15.5)
WBC Count: 3.1 10*3/uL — ABNORMAL LOW (ref 4.0–10.5)
nRBC: 0 % (ref 0.0–0.2)

## 2020-04-25 LAB — CMP (CANCER CENTER ONLY)
ALT: 19 U/L (ref 0–44)
AST: 16 U/L (ref 15–41)
Albumin: 3.6 g/dL (ref 3.5–5.0)
Alkaline Phosphatase: 92 U/L (ref 38–126)
Anion gap: 11 (ref 5–15)
BUN: 11 mg/dL (ref 6–20)
CO2: 26 mmol/L (ref 22–32)
Calcium: 8.5 mg/dL — ABNORMAL LOW (ref 8.9–10.3)
Chloride: 100 mmol/L (ref 98–111)
Creatinine: 0.66 mg/dL (ref 0.44–1.00)
GFR, Estimated: >60 mL/min (ref >60)
Glucose, Bld: 138 mg/dL — ABNORMAL HIGH (ref 70–99)
Potassium: 3.1 mmol/L — ABNORMAL LOW (ref 3.5–5.1)
Sodium: 137 mmol/L (ref 135–145)
Total Bilirubin: 0.5 mg/dL (ref 0.3–1.2)
Total Protein: 6.6 g/dL (ref 6.5–8.1)

## 2020-04-25 MED ORDER — DIPHENHYDRAMINE HCL 50 MG/ML IJ SOLN
25.0000 mg | Freq: Once | INTRAMUSCULAR | Status: AC
  Administered 2020-04-25: 25 mg via INTRAVENOUS

## 2020-04-25 MED ORDER — SODIUM CHLORIDE 0.9 % IV SOLN
80.0000 mg/m2 | Freq: Once | INTRAVENOUS | Status: AC
  Administered 2020-04-25: 192 mg via INTRAVENOUS
  Filled 2020-04-25: qty 32

## 2020-04-25 MED ORDER — SODIUM CHLORIDE 0.9 % IV SOLN
Freq: Once | INTRAVENOUS | Status: AC
Start: 2020-04-25 — End: 2020-04-25
  Filled 2020-04-25: qty 250

## 2020-04-25 MED ORDER — SODIUM CHLORIDE 0.9% FLUSH
10.0000 mL | Freq: Once | INTRAVENOUS | Status: AC
  Administered 2020-04-25: 10 mL
  Filled 2020-04-25: qty 10

## 2020-04-25 MED ORDER — HEPARIN SOD (PORK) LOCK FLUSH 100 UNIT/ML IV SOLN
500.0000 [IU] | Freq: Once | INTRAVENOUS | Status: AC | PRN
  Administered 2020-04-25: 500 [IU]
  Filled 2020-04-25: qty 5

## 2020-04-25 MED ORDER — SODIUM CHLORIDE 0.9% FLUSH
10.0000 mL | INTRAVENOUS | Status: DC | PRN
  Administered 2020-04-25: 10 mL
  Filled 2020-04-25: qty 10

## 2020-04-25 MED ORDER — FAMOTIDINE IN NACL 20-0.9 MG/50ML-% IV SOLN
INTRAVENOUS | Status: AC
  Filled 2020-04-25: qty 50

## 2020-04-25 MED ORDER — DIPHENHYDRAMINE HCL 50 MG/ML IJ SOLN
INTRAMUSCULAR | Status: AC
  Filled 2020-04-25: qty 1

## 2020-04-25 MED ORDER — FAMOTIDINE IN NACL 20-0.9 MG/50ML-% IV SOLN
20.0000 mg | Freq: Once | INTRAVENOUS | Status: AC
  Administered 2020-04-25: 20 mg via INTRAVENOUS

## 2020-04-25 MED ORDER — SODIUM CHLORIDE 0.9 % IV SOLN
20.0000 mg | Freq: Once | INTRAVENOUS | Status: AC
  Administered 2020-04-25: 20 mg via INTRAVENOUS
  Filled 2020-04-25: qty 20

## 2020-04-25 NOTE — Progress Notes (Signed)
Per Dr. Lindi Adie, ok for treatment today with [place] 1.3

## 2020-04-25 NOTE — Progress Notes (Signed)
[facility] CSW Progress Note  [facility] representative met with patient in infusion for ongoing support.  Patient is doing well overall but has started to have some moments of anxiety and crying. They are triggered by certain things and she is working with a Social worker through her EAC program to help address this which has been helpful.    CSW will see patient again in 3 weeks in infusion  [name] , LCSW

## 2020-04-25 NOTE — Patient Instructions (Signed)
[facility] Discharge Instructions for Patients Receiving Chemotherapy  Today you received the following chemotherapy agent: Paclitaxel (Taxol)  To help prevent nausea and vomiting after your treatment, we encourage you to take your nausea medication as directed by your MD.   If you develop nausea and vomiting that is not controlled by your nausea medication, call the clinic.   BELOW ARE SYMPTOMS THAT SHOULD BE REPORTED IMMEDIATELY:  *FEVER GREATER THAN 100.5 F  *CHILLS WITH OR WITHOUT FEVER  NAUSEA AND VOMITING THAT IS NOT CONTROLLED WITH YOUR NAUSEA MEDICATION  *UNUSUAL SHORTNESS OF BREATH  *UNUSUAL BRUISING OR BLEEDING  TENDERNESS IN MOUTH AND THROAT WITH OR WITHOUT PRESENCE OF ULCERS  *URINARY PROBLEMS  *BOWEL PROBLEMS  UNUSUAL RASH Items with * indicate a potential emergency and should be followed up as soon as possible.  Feel free to call the clinic should you have any questions or concerns. The clinic phone number is [phone].  Please show the [facility] at check-in to the Emergency Department and triage nurse.

## 2020-04-30 ENCOUNTER — Other Ambulatory Visit: Payer: Self-pay | Admitting: Hematology and Oncology

## 2020-04-30 ENCOUNTER — Other Ambulatory Visit: Payer: Self-pay | Admitting: Medical

## 2020-04-30 DIAGNOSIS — Z170 Estrogen receptor positive status [ER+]: Secondary | ICD-10-CM

## 2020-04-30 DIAGNOSIS — C50411 Malignant neoplasm of upper-outer quadrant of right female breast: Secondary | ICD-10-CM

## 2020-04-30 NOTE — Telephone Encounter (Signed)
Dr. Lindi Adie, For your approval or refusal.  She comes in tomorrow for infusion. Gardiner Rhyme, RN

## 2020-04-30 NOTE — Progress Notes (Signed)
Patient Care Team: Forrest Moron, MD as PCP - General (Internal Medicine) Rockwell Germany, RN as Oncology Nurse Navigator Nicholas Lose, MD as Consulting Physician (Hematology and Oncology)  DIAGNOSIS:    ICD-10-CM   1. Malignant neoplasm of upper-outer quadrant of right breast in female, estrogen receptor positive ([place])  C50.411 MR BREAST BILATERAL W WO CONTRAST INC CAD   Z17.0     SUMMARY OF ONCOLOGIC HISTORY: Oncology History  Malignant neoplasm of upper-outer quadrant of right breast in female, estrogen receptor positive ([place])  12/13/2019 Initial Diagnosis   Screening detected right breast masses 11:30 position: 4.3 cm: Biopsy grade 3 IDC ER 90% PR 0%, KI 40%, HER-2 negative 12 o'clock position: 1.9 cm: Biopsy: Grade 3 IDC ER 40%, PR 0%, HER-2 negative, Ki-67 40%, right axillary lymph node biopsied benign but discordant   12/19/2019 Cancer Staging   Staging form: Breast, AJCC 8th Edition - Clinical stage from 12/19/2019: Stage IIIA (cT2, cN1, cM0, G3, ER+, PR-, HER2-) - Signed by Nicholas Lose, MD on 12/19/2019   01/03/2020 -  Chemotherapy    Patient is on Treatment Plan: BREAST DOSE DENSE AC Q14D / CARBOPLATIN D1 + PACLITAXEL D1,8,15 Q21D      02/28/2020 Genetic Testing   Negative genetic testing:  No pathogenic variants detected on the [facility] CustomNext-Cancer + RNAinsight panel. The report date is 02/28/2020.   The CustomNext-Cancer+RNAinsight panel offered by [facility] includes sequencing and rearrangement analysis for the following 47 genes:  APC, ATM, AXIN2, BARD1, BMPR1A, BRCA1, BRCA2, BRIP1, CDH1, CDK4, CDKN2A, CHEK2, DICER1, EPCAM, GREM1, HOXB13, MEN1, MLH1, MSH2, MSH3, MSH6, MUTYH, NBN, NF1, NF2, NTHL1, PALB2, PMS2, POLD1, POLE, PTEN, RAD51C, RAD51D, RECQL, RET, SDHA, SDHAF2, SDHB, SDHC, SDHD, SMAD4, SMARCA4, STK11, TP53, TSC1, TSC2, and VHL.  RNA data is routinely analyzed for use in variant interpretation for all genes.     CHIEF COMPLIANT: Cycle7  Taxol  INTERVAL HISTORY: Cathy Parrish is a 50 y.o. with above-mentioned history of rightbreast cancercurrently on neoadjuvant chemotherapy with weekly Taxol and Carboplatin every 3 weeks. 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 She tested positive for COVID-19 on 03/11/20 and treated was paused for three weeks. She presents to the clinic todayfor a toxicity checkandtreatment.   ALLERGIES:  has No Known Allergies.  MEDICATIONS:  Current Outpatient Medications  Medication Sig Dispense Refill  . albuterol (PROVENTIL HFA;VENTOLIN HFA) 108 (90 BASE) MCG/ACT inhaler Inhale 2 puffs into the lungs every 4 (four) hours as needed for wheezing. 1 Inhaler 0  . ALPRAZolam (XANAX) 0.5 MG tablet TAKE 1 TABLET(0.5 MG) BY MOUTH AT BEDTIME AS NEEDED FOR ANXIETY 30 tablet 0  . Aspirin-Salicylamide-Caffeine (BC HEADACHE POWDER PO) Take 1 packet by mouth daily as needed (for headache).    Marland Kitchen atorvastatin (LIPITOR) 20 MG tablet Take 1 tablet (20 mg total) by mouth daily. 90 tablet 3  . dexamethasone (DECADRON) 4 MG tablet Take 1 tablet (4 mg total) by mouth daily. Take 1 tablet day after chemo and 1 tablet 2 days after chemo with food 8 tablet 0  . dicyclomine (BENTYL) 10 MG capsule Take 1 capsule (10 mg total) by mouth 3 (three) times daily as needed for spasms. 30 capsule 0  . fluconazole (DIFLUCAN) 100 MG tablet Take 1 tablet (100 mg total) by mouth daily. 30 tablet 0  . fluticasone (FLONASE) 50 MCG/ACT nasal spray Place 2 sprays into both nostrils 2 (two) times daily. Decrease to 2 sprays/nostril  daily after 5 days 16 g 2  . ibuprofen (ADVIL) 800 MG tablet Take 800 mg by mouth 3 (three) times daily.    . irbesartan-hydrochlorothiazide (AVALIDE) 150-12.5 MG tablet Take 1 tablet by mouth daily. 90 tablet 2  . lidocaine-prilocaine (EMLA) cream Apply to affected area once 30 g 3  . ondansetron (ZOFRAN) 8 MG tablet Take 1 tablet (8 mg total) by mouth 2 (two) times daily as  needed. Start on the third day after chemotherapy. 30 tablet 1  . oxyCODONE (OXY IR/ROXICODONE) 5 MG immediate release tablet Take 1 tablet (5 mg total) by mouth every 6 (six) hours as needed for severe pain. 30 tablet 0  . potassium chloride SA (KLOR-CON) 20 MEQ tablet Take 1 tablet (20 mEq total) by mouth once for 1 dose. 14 tablet 0  . prochlorperazine (COMPAZINE) 10 MG tablet TAKE 1 TABLET(10 MG) BY MOUTH EVERY 6 HOURS AS NEEDED FOR NAUSEA 30 tablet 1  . QUEtiapine (SEROQUEL) 25 MG tablet TAKE 1 TO 2 TABLETS(25 TO 50 MG) BY MOUTH AT BEDTIME 90 tablet 3  . rivaroxaban (XARELTO) 20 MG TABS tablet Take 1 tablet (20 mg total) by mouth daily with supper. 30 tablet 2  . SUMAtriptan (IMITREX) 50 MG tablet Take one tablet by mouth at the first sign of headache. May repeat in 2 hours if headache persists or recurs. 10 tablet 0  . Tetrahydrozoline HCl (VISINE OP) Apply 1 drop to eye as needed (for allergies).     No current facility-administered medications for this visit.   Facility-Administered Medications Ordered in Other Visits  Medication Dose Route Frequency Provider Last Rate Last Admin  . 0.9 %  sodium chloride infusion   Intravenous Once Nicholas Lose, MD      . CARBOplatin (PARAPLATIN) 700 mg in sodium chloride 0.9 % 250 mL chemo infusion  700 mg Intravenous Once Nicholas Lose, MD      . dexamethasone (DECADRON) 10 mg in sodium chloride 0.9 % 50 mL IVPB  10 mg Intravenous Once Nicholas Lose, MD      . fosaprepitant (EMEND) 150 mg in sodium chloride 0.9 % 145 mL IVPB  150 mg Intravenous Once Nicholas Lose, MD      . heparin lock flush 100 unit/mL  500 Units Intracatheter Once PRN Nicholas Lose, MD      . palonosetron (ALOXI) injection 0.25 mg  0.25 mg Intravenous Once Nicholas Lose, MD      . sodium chloride flush (NS) 0.9 % injection 10 mL  10 mL Intracatheter PRN Nicholas Lose, MD        PHYSICAL EXAMINATION: ECOG PERFORMANCE STATUS: 1 - Symptomatic but completely ambulatory  Vitals:    05/01/20 1204  BP: 99/65  Pulse: 94  Resp: 18  Temp: 97.7 F (36.5 C)  SpO2: 100%   Filed Weights   05/01/20 1204  Weight: 267 lb 1.6 oz (121.2 kg)    LABORATORY DATA:  I have reviewed the data as listed CMP Latest Ref Rng & Units 05/01/2020 04/25/2020 04/17/2020  Glucose 70 - 99 mg/dL 122(H) 138(H) 139(H)  BUN 6 - 20 mg/dL _0 Creatinine 0.44 - 1.00 mg/dL 0.69 0.66 0.96  Sodium 135 - 145 mmol/L 138 137 134(L)  Potassium 3.5 - 5.1 mmol/L 3.7 3.1(L) 3.1(L)  Chloride 98 - 111 mmol/L 103 100 97(L)  CO2 22 - 32 mmol/L _1 Calcium 8.9 - 10.3 mg/dL 8.4(L) 8.5(L) 9.2  Total Protein 6.5 - 8.1 g/dL 6.5 6.6  7.8  Total Bilirubin 0.3 - 1.2 mg/dL 0.6 0.5 0.7  Alkaline Phos 38 - 126 U/L 78 92 103  AST 15 - 41 U/L 14(L) 16 18  ALT 0 - 44 U/L _0 Lab Results  Component Value Date   WBC 3.0 (L) 05/01/2020   HGB 9.5 (L) 05/01/2020   HCT 27.7 (L) 05/01/2020   MCV 94.5 05/01/2020   PLT 144 (L) 05/01/2020   NEUTROABS 1.5 (L) 05/01/2020    ASSESSMENT & PLAN:  Malignant neoplasm of upper-outer quadrant of right breast in female, estrogen receptor positive ([place]) 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%, HER-2 negative 12 o'clock position: 1.9 cm: Biopsy: Grade 3 IDC ER 40%(weakly positive), PR 0%, HER-2 negative, Ki-67 40%, right axillary lymph node biopsied benign but discordant T2N?M0 stage IIa versus stage IIIa (based on the lymph node)  Treatment plan: 1.Neoadjuvant chemotherapy with dose dense Adriamycin and Cytoxan followed by Albertine Patricia 2.mastectomy with reconstruction (that is the patient's preference).  She might need contralateral breast reduction. 3.Adjuvant radiation 4.Followed by adjuvant antiestrogen therapy with  abemaciclib ----------------------------------------------------------------------------------------------------------------------------------------------------- Current treatment:Cycle 7 Taxol (Taxol discontinued.  She is receiving [place] final dose today) Echocardiogram 12/27/2019: EF 60 to 65%  Chemo Toxicities: 1.Mild nausea:Anti-emetics 2.monitoring for chemo related toxicities and blood counts. 3. Dysgeusia: Keeping up with oral intake 4.  Severe peripheral neuropathy: The neuropathy is up to the middle of her palm.  She is having weakness in the hands as well.  Based on the sudden onset of severe toxicity, I recommended that we discontinue Taxol going further. Today will be her last dosage of carboplatin and this will conclude her chemotherapy. I ordered a breast MRI to be done. I sent a message to Dr. Barry Dienes to see her after breast MRI to plan her surgery.  She will be discussed in the breast tumor board after the breast MRI. Return to clinic after surgery to discuss pathology report.    Orders Placed This Encounter  Procedures  . MR BREAST BILATERAL W WO CONTRAST INC CAD    Standing Status:   Future    Standing Expiration Date:   05/01/2021    Order Specific Question:   If indicated for the ordered procedure, I authorize the administration of contrast media per Radiology protocol    Answer:   Yes    Order Specific Question:   What is the patient's sedation requirement?    Answer:   No Sedation    Order Specific Question:   Does the patient have a pacemaker or implanted devices?    Answer:   No    Order Specific Question:   Preferred imaging location?    Answer:   [facility] (table limit-550lbs)    Order Specific Question:   Release to patient    Answer:   Immediate   The patient has a good understanding of the overall plan. she agrees with it. she will call with any problems that may develop before the next visit here.  Total time spent: 30 mins including  face to face time and time spent for planning, charting and coordination of care  Rulon Eisenmenger, MD, MPH 05/01/2020  I, Cloyde Reams Dorshimer, am acting as scribe for Dr. Nicholas Lose.  I have reviewed the above documentation for accuracy and completeness, and I agree with the above.

## 2020-05-01 ENCOUNTER — Inpatient Hospital Stay: Payer: BC Managed Care – PPO | Attending: Hematology and Oncology

## 2020-05-01 ENCOUNTER — Inpatient Hospital Stay: Payer: BC Managed Care – PPO | Admitting: Hematology and Oncology

## 2020-05-01 ENCOUNTER — Other Ambulatory Visit: Payer: Self-pay

## 2020-05-01 ENCOUNTER — Inpatient Hospital Stay: Payer: BC Managed Care – PPO

## 2020-05-01 DIAGNOSIS — Z5111 Encounter for antineoplastic chemotherapy: Secondary | ICD-10-CM | POA: Insufficient documentation

## 2020-05-01 DIAGNOSIS — Z7901 Long term (current) use of anticoagulants: Secondary | ICD-10-CM | POA: Diagnosis not present

## 2020-05-01 DIAGNOSIS — G620 Drug-induced polyneuropathy: Secondary | ICD-10-CM | POA: Diagnosis not present

## 2020-05-01 DIAGNOSIS — Z8616 Personal history of COVID-19: Secondary | ICD-10-CM | POA: Diagnosis not present

## 2020-05-01 DIAGNOSIS — Z79899 Other long term (current) drug therapy: Secondary | ICD-10-CM | POA: Insufficient documentation

## 2020-05-01 DIAGNOSIS — Z170 Estrogen receptor positive status [ER+]: Secondary | ICD-10-CM | POA: Diagnosis not present

## 2020-05-01 DIAGNOSIS — Z86718 Personal history of other venous thrombosis and embolism: Secondary | ICD-10-CM | POA: Diagnosis not present

## 2020-05-01 DIAGNOSIS — C50411 Malignant neoplasm of upper-outer quadrant of right female breast: Secondary | ICD-10-CM | POA: Insufficient documentation

## 2020-05-01 DIAGNOSIS — Z9011 Acquired absence of right breast and nipple: Secondary | ICD-10-CM | POA: Insufficient documentation

## 2020-05-01 LAB — CBC WITH DIFFERENTIAL (CANCER CENTER ONLY)
Abs Immature Granulocytes: 0.03 10*3/uL (ref 0.00–0.07)
Basophils Absolute: 0 10*3/uL (ref 0.0–0.1)
Basophils Relative: 1 %
Eosinophils Absolute: 0 10*3/uL (ref 0.0–0.5)
Eosinophils Relative: 1 %
HCT: 27.7 % — ABNORMAL LOW (ref 36.0–46.0)
Hemoglobin: 9.5 g/dL — ABNORMAL LOW (ref 12.0–15.0)
Immature Granulocytes: 1 %
Lymphocytes Relative: 38 %
Lymphs Abs: 1.1 10*3/uL (ref 0.7–4.0)
MCH: 32.4 pg (ref 26.0–34.0)
MCHC: 34.3 g/dL (ref 30.0–36.0)
MCV: 94.5 fL (ref 80.0–100.0)
Monocytes Absolute: 0.3 10*3/uL (ref 0.1–1.0)
Monocytes Relative: 8 %
Neutro Abs: 1.5 10*3/uL — ABNORMAL LOW (ref 1.7–7.7)
Neutrophils Relative %: 51 %
Platelet Count: 144 10*3/uL — ABNORMAL LOW (ref 150–400)
RBC: 2.93 MIL/uL — ABNORMAL LOW (ref 3.87–5.11)
RDW: 15.1 % (ref 11.5–15.5)
WBC Count: 3 10*3/uL — ABNORMAL LOW (ref 4.0–10.5)
nRBC: 0 % (ref 0.0–0.2)

## 2020-05-01 LAB — CMP (CANCER CENTER ONLY)
ALT: 18 U/L (ref 0–44)
AST: 14 U/L — ABNORMAL LOW (ref 15–41)
Albumin: 3.5 g/dL (ref 3.5–5.0)
Alkaline Phosphatase: 78 U/L (ref 38–126)
Anion gap: 11 (ref 5–15)
BUN: 9 mg/dL (ref 6–20)
CO2: 24 mmol/L (ref 22–32)
Calcium: 8.4 mg/dL — ABNORMAL LOW (ref 8.9–10.3)
Chloride: 103 mmol/L (ref 98–111)
Creatinine: 0.69 mg/dL (ref 0.44–1.00)
GFR, Estimated: >60 mL/min (ref >60)
Glucose, Bld: 122 mg/dL — ABNORMAL HIGH (ref 70–99)
Potassium: 3.7 mmol/L (ref 3.5–5.1)
Sodium: 138 mmol/L (ref 135–145)
Total Bilirubin: 0.6 mg/dL (ref 0.3–1.2)
Total Protein: 6.5 g/dL (ref 6.5–8.1)

## 2020-05-01 MED ORDER — CARBOPLATIN CHEMO INJECTION 600 MG/60ML
700.0000 mg | Freq: Once | INTRAVENOUS | Status: AC
  Administered 2020-05-01: 700 mg via INTRAVENOUS
  Filled 2020-05-01: qty 70

## 2020-05-01 MED ORDER — HEPARIN SOD (PORK) LOCK FLUSH 100 UNIT/ML IV SOLN
500.0000 [IU] | Freq: Once | INTRAVENOUS | Status: AC | PRN
  Administered 2020-05-01: 500 [IU]
  Filled 2020-05-01: qty 5

## 2020-05-01 MED ORDER — SODIUM CHLORIDE 0.9% FLUSH
10.0000 mL | INTRAVENOUS | Status: DC | PRN
  Administered 2020-05-01: 10 mL
  Filled 2020-05-01: qty 10

## 2020-05-01 MED ORDER — PALONOSETRON HCL INJECTION 0.25 MG/5ML
INTRAVENOUS | Status: AC
  Filled 2020-05-01: qty 5

## 2020-05-01 MED ORDER — SODIUM CHLORIDE 0.9 % IV SOLN
150.0000 mg | Freq: Once | INTRAVENOUS | Status: AC
  Administered 2020-05-01: 150 mg via INTRAVENOUS
  Filled 2020-05-01: qty 150

## 2020-05-01 MED ORDER — SODIUM CHLORIDE 0.9 % IV SOLN
150.0000 mg | Freq: Once | INTRAVENOUS | Status: DC
  Filled 2020-05-01: qty 5

## 2020-05-01 MED ORDER — SODIUM CHLORIDE 0.9 % IV SOLN
10.0000 mg | Freq: Once | INTRAVENOUS | Status: AC
  Administered 2020-05-01: 10 mg via INTRAVENOUS
  Filled 2020-05-01: qty 10

## 2020-05-01 MED ORDER — FAMOTIDINE IN NACL 20-0.9 MG/50ML-% IV SOLN: 20.0000 mg | Freq: Once | INTRAVENOUS | Status: DC

## 2020-05-01 MED ORDER — PALONOSETRON HCL INJECTION 0.25 MG/5ML
0.2500 mg | Freq: Once | INTRAVENOUS | Status: AC
  Administered 2020-05-01: 0.25 mg via INTRAVENOUS

## 2020-05-01 MED ORDER — SODIUM CHLORIDE 0.9 % IV SOLN
Freq: Once | INTRAVENOUS | Status: AC
  Filled 2020-05-01: qty 250

## 2020-05-01 MED ORDER — DIPHENHYDRAMINE HCL 50 MG/ML IJ SOLN: 25.0000 mg | Freq: Once | INTRAMUSCULAR | Status: DC

## 2020-05-01 NOTE — Assessment & Plan Note (Addendum)
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%, HER-2 negative 12 o'clock position: 1.9 cm: Biopsy: Grade 3 IDC ER 40%(weakly positive), PR 0%, HER-2 negative, Ki-67 40%, right axillary lymph node biopsied benign but discordant T2N?M0 stage IIa versus stage IIIa (based on the lymph node)  Treatment plan: 1.Neoadjuvant chemotherapy with dose dense Adriamycin and Cytoxan followed by Cathy Parrish 2.mastectomy with reconstruction (that is the patient's preference).  She might need contralateral breast reduction. 3.Adjuvant radiation 4.Followed by adjuvant antiestrogen therapy with abemaciclib ----------------------------------------------------------------------------------------------------------------------------------------------------- Current treatment:Cycle 7 Taxol (Taxol discontinued.  She is receiving [place] final dose today) Echocardiogram 12/27/2019: EF 60 to 65%  Chemo Toxicities: 1.Mild nausea:Anti-emetics 2.monitoring for chemo related toxicities and blood counts. 3. Dysgeusia: Keeping up with oral intake 4.  Severe peripheral neuropathy: The neuropathy is up to the middle of her palm.  She is having weakness in the hands as well.  Based on the sudden onset of severe toxicity, I recommended that we discontinue Taxol going further. Today will be her last dosage of carboplatin and this will conclude her chemotherapy. I ordered a breast MRI to be done. I sent a message to Dr. Barry Dienes to see her after breast MRI to plan her surgery.  She will be discussed in the breast tumor board after the breast MRI. Return to clinic after surgery to discuss pathology report.

## 2020-05-01 NOTE — Patient Instructions (Signed)
[facility] Discharge Instructions for Patients Receiving Chemotherapy  Today you received the following chemotherapy agents: Carboplatin (Paraplatin)  To help prevent nausea and vomiting after your treatment, we encourage you to take your nausea medication as directed by your provider   If you develop nausea and vomiting that is not controlled by your nausea medication, call the clinic.   BELOW ARE SYMPTOMS THAT SHOULD BE REPORTED IMMEDIATELY:  *FEVER GREATER THAN 100.5 F  *CHILLS WITH OR WITHOUT FEVER  NAUSEA AND VOMITING THAT IS NOT CONTROLLED WITH YOUR NAUSEA MEDICATION  *UNUSUAL SHORTNESS OF BREATH  *UNUSUAL BRUISING OR BLEEDING  TENDERNESS IN MOUTH AND THROAT WITH OR WITHOUT PRESENCE OF ULCERS  *URINARY PROBLEMS  *BOWEL PROBLEMS  UNUSUAL RASH Items with * indicate a potential emergency and should be followed up as soon as possible.  Feel free to call the clinic should you have any questions or concerns. The clinic phone number is [phone].  Please show the CHEMO ALERT CARD at check-in to the Emergency Department and triage nurse.   

## 2020-05-01 NOTE — Patient Instructions (Signed)
Tunneled Central Venous Catheter Flushing Guide  It is important to flush your tunneled central venous catheter each time you use it, both before and after you use it. Flushing your catheter will help prevent it from clogging. What are the risks? Risks may include:  Infection.  Air getting into the catheter and bloodstream. Supplies needed:  A clean pair of gloves.  A disinfecting wipe. Use an alcohol wipe, chlorhexidine wipe, or iodine wipe as told by your health care provider.  A 10 mL syringe that has been prefilled with saline solution.  An empty 10 mL syringe, if a substance called heparin was injected into your catheter. How to flush your catheter When you flush your catheter, make sure you follow any specific instructions from your health care provider or the manufacturer. These are general guidelines. Flushing your catheter before use If there is heparin in your catheter: 1. Wash your hands with soap and water. 2. Put on gloves. 3. Scrub the injection cap for a minimum of 15 seconds with a disinfecting wipe. 4. Unclamp the catheter. 5. Attach the empty syringe to the injection cap. 6. Pull the syringe plunger back and withdraw 10 mL of blood. 7. Place the syringe into an appropriate waste container. 8. Scrub the injection cap for 15 seconds with a disinfecting wipe. 9. Attach the prefilled syringe to the injection cap. 10. Flush the catheter by pushing the plunger forward until all the liquid from the syringe is in the catheter. 11. Remove the syringe from the injection cap. 12. Clamp the catheter. If there is no heparin in your catheter: 1. Wash your hands with soap and water. 2. Put on gloves. 3. Scrub the injection cap for 15 seconds with a disinfecting wipe. 4. Unclamp the catheter. 5. Attach the prefilled syringe to the injection cap. 6. Flush the catheter by pushing the plunger forward until 5 mL of the liquid from the syringe is in the catheter. 7. Pull back on  the syringe until you see blood in the catheter. 8. If you have been asked to collect any blood, follow your health care provider's instructions. Otherwise, flush the catheter with the rest of the solution from the syringe. 9. Remove the syringe from the injection cap. 10. Clamp the catheter.   Flushing your catheter after use 1. Wash your hands with soap and water. 2. Put on gloves. 3. Scrub the injection cap for 15 seconds with a disinfecting wipe. 4. Unclamp the catheter. 5. Attach the prefilled syringe to the injection cap. 6. Flush the catheter by pushing the plunger forward until all of the liquid from the syringe is in the catheter. 7. Remove the syringe from the injection cap. 8. Clamp the catheter. Problems and solutions  If blood cannot be completely cleared from the injection cap, you may need to have the injection cap replaced.  If the catheter is difficult to flush, use the pulsing method. The pulsing method involves pushing only a few milliliters of solution into the catheter at a time and pausing between pushes.  If you do not see blood in the catheter when you pull back on the syringe, change your body position, such as by raising your arms above your head. Take a deep breath and cough. Then, pull back on the syringe. If you still do not see blood, flush the catheter with a small amount of solution. Then, change positions again and take a breath or cough. Pull back on the syringe again. If you still do not   see blood, finish flushing the catheter and contact your health care provider. Do not use your catheter until your health care provider says it is okay. General tips  Have someone help you flush your catheter, if possible.  Do not force fluid through your catheter.  Do not use a syringe that is larger or smaller than 10 mL. Using a smaller syringe can make the catheter burst.  Do not use your catheter without flushing it first if it has heparin in it. Contact a health  care provider if:  You cannot see any blood in the catheter when you flush it before using it.  Your catheter is difficult to flush. Get help right away if:  You cannot flush the catheter.  The catheter leaks when you flush it or when there is fluid in it.  There are cracks or breaks in the catheter. Summary  It is important to flush your tunneled central venous catheter each time you use it, both before and after you use it.  Scrub the injection cap for 15 seconds with a disinfecting wipe before and after you flush it.  When you flush your catheter, make sure you follow any specific instructions from your health care provider or the manufacturer.  Get help right away if you cannot flush the catheter. This information is not intended to replace advice given to you by your health care provider. Make sure you discuss any questions you have with your health care provider. Document Revised: 04/26/2019 Document Reviewed: 05/03/2018 Elsevier Patient Education  2021 [employer]  

## 2020-05-06 ENCOUNTER — Telehealth: Payer: Self-pay | Admitting: Hematology and Oncology

## 2020-05-06 NOTE — Telephone Encounter (Signed)
Cancelled upcoming appointments per 3/8 schedule message. Patient is aware of changes.

## 2020-05-08 ENCOUNTER — Inpatient Hospital Stay: Payer: BC Managed Care – PPO

## 2020-05-08 ENCOUNTER — Other Ambulatory Visit: Payer: BC Managed Care – PPO

## 2020-05-08 ENCOUNTER — Ambulatory Visit: Payer: BC Managed Care – PPO

## 2020-05-09 ENCOUNTER — Ambulatory Visit
Admission: RE | Admit: 2020-05-09 | Discharge: 2020-05-09 | Disposition: A | Discharge status: Home / Self Care | Payer: BC Managed Care – PPO | Source: Ambulatory Visit | Attending: Hematology and Oncology | Admitting: Hematology and Oncology

## 2020-05-09 DIAGNOSIS — Z170 Estrogen receptor positive status [ER+]: Secondary | ICD-10-CM

## 2020-05-09 DIAGNOSIS — C50911 Malignant neoplasm of unspecified site of right female breast: Secondary | ICD-10-CM | POA: Diagnosis not present

## 2020-05-09 MED ORDER — GADOBUTROL 1 MMOL/ML IV SOLN
10.0000 mL | Freq: Once | INTRAVENOUS | Status: AC | PRN
  Administered 2020-05-09: 10 mL via INTRAVENOUS

## 2020-05-13 ENCOUNTER — Other Ambulatory Visit: Payer: Self-pay | Admitting: Hematology and Oncology

## 2020-05-13 DIAGNOSIS — Z170 Estrogen receptor positive status [ER+]: Secondary | ICD-10-CM

## 2020-05-15 ENCOUNTER — Encounter: Payer: Self-pay | Admitting: *Deleted

## 2020-05-15 ENCOUNTER — Other Ambulatory Visit: Payer: BC Managed Care – PPO

## 2020-05-15 ENCOUNTER — Ambulatory Visit: Payer: BC Managed Care – PPO | Admitting: Hematology and Oncology

## 2020-05-15 ENCOUNTER — Ambulatory Visit: Payer: BC Managed Care – PPO

## 2020-05-21 ENCOUNTER — Encounter: Payer: Self-pay | Admitting: *Deleted

## 2020-05-24 ENCOUNTER — Other Ambulatory Visit: Payer: Self-pay | Admitting: Hematology and Oncology

## 2020-05-26 ENCOUNTER — Other Ambulatory Visit: Payer: Self-pay | Admitting: *Deleted

## 2020-05-27 ENCOUNTER — Other Ambulatory Visit: Payer: Self-pay | Admitting: General Surgery

## 2020-05-27 DIAGNOSIS — Z170 Estrogen receptor positive status [ER+]: Secondary | ICD-10-CM

## 2020-05-27 DIAGNOSIS — C50411 Malignant neoplasm of upper-outer quadrant of right female breast: Secondary | ICD-10-CM | POA: Diagnosis not present

## 2020-05-27 DIAGNOSIS — Z803 Family history of malignant neoplasm of breast: Secondary | ICD-10-CM | POA: Diagnosis not present

## 2020-05-27 DIAGNOSIS — R928 Other abnormal and inconclusive findings on diagnostic imaging of breast: Secondary | ICD-10-CM | POA: Diagnosis not present

## 2020-05-29 ENCOUNTER — Encounter: Payer: Self-pay | Admitting: *Deleted

## 2020-05-29 ENCOUNTER — Other Ambulatory Visit: Payer: Self-pay | Admitting: General Surgery

## 2020-05-29 ENCOUNTER — Telehealth: Payer: Self-pay | Admitting: Hematology and Oncology

## 2020-05-29 DIAGNOSIS — Z170 Estrogen receptor positive status [ER+]: Secondary | ICD-10-CM

## 2020-05-29 DIAGNOSIS — C50411 Malignant neoplasm of upper-outer quadrant of right female breast: Secondary | ICD-10-CM

## 2020-05-29 NOTE — Telephone Encounter (Signed)
Scheduled appt per 3/31 sch msg. Pt aware.  

## 2020-06-17 ENCOUNTER — Other Ambulatory Visit: Payer: Self-pay | Admitting: General Surgery

## 2020-06-17 ENCOUNTER — Encounter (HOSPITAL_COMMUNITY): Payer: Self-pay

## 2020-06-17 ENCOUNTER — Other Ambulatory Visit: Payer: Self-pay

## 2020-06-17 ENCOUNTER — Encounter (HOSPITAL_COMMUNITY)
Admission: RE | Admit: 2020-06-17 | Discharge: 2020-06-17 | Disposition: A | Discharge status: Home / Self Care | Payer: BC Managed Care – PPO | Source: Ambulatory Visit | Attending: General Surgery | Admitting: General Surgery

## 2020-06-17 ENCOUNTER — Other Ambulatory Visit: Payer: Self-pay | Admitting: Medical

## 2020-06-17 DIAGNOSIS — Z01812 Encounter for preprocedural laboratory examination: Secondary | ICD-10-CM | POA: Insufficient documentation

## 2020-06-17 DIAGNOSIS — R928 Other abnormal and inconclusive findings on diagnostic imaging of breast: Secondary | ICD-10-CM

## 2020-06-17 HISTORY — DX: Headache, unspecified: R51.9

## 2020-06-17 HISTORY — DX: Unspecified osteoarthritis, unspecified site: M19.90

## 2020-06-17 HISTORY — DX: Anxiety disorder, unspecified: F41.9

## 2020-06-17 LAB — HEMOGLOBIN A1C
Hgb A1c MFr Bld: 6.3 % — ABNORMAL HIGH (ref 4.8–5.6)
Mean Plasma Glucose: 134.11 mg/dL

## 2020-06-17 LAB — BASIC METABOLIC PANEL
Anion gap: 9 (ref 5–15)
BUN: 11 mg/dL (ref 6–20)
CO2: 27 mmol/L (ref 22–32)
Calcium: 9.3 mg/dL (ref 8.9–10.3)
Chloride: 101 mmol/L (ref 98–111)
Creatinine, Ser: 0.66 mg/dL (ref 0.44–1.00)
GFR, Estimated: >60 mL/min (ref >60)
Glucose, Bld: 105 mg/dL — ABNORMAL HIGH (ref 70–99)
Potassium: 3.3 mmol/L — ABNORMAL LOW (ref 3.5–5.1)
Sodium: 137 mmol/L (ref 135–145)

## 2020-06-17 LAB — CBC
HCT: 35.4 % — ABNORMAL LOW (ref 36.0–46.0)
Hemoglobin: 11.7 g/dL — ABNORMAL LOW (ref 12.0–15.0)
MCH: 31.5 pg (ref 26.0–34.0)
MCHC: 33.1 g/dL (ref 30.0–36.0)
MCV: 95.4 fL (ref 80.0–100.0)
Platelets: 335 10*3/uL (ref 150–400)
RBC: 3.71 MIL/uL — ABNORMAL LOW (ref 3.87–5.11)
RDW: 16.4 % — ABNORMAL HIGH (ref 11.5–15.5)
WBC: 6.6 10*3/uL (ref 4.0–10.5)
nRBC: 0 % (ref 0.0–0.2)

## 2020-06-17 LAB — POCT PREGNANCY, URINE: Preg Test, Ur: NEGATIVE

## 2020-06-17 LAB — GLUCOSE, CAPILLARY: Glucose-Capillary: 91 mg/dL (ref 70–99)

## 2020-06-17 NOTE — Progress Notes (Incomplete)
Surgical Instructions    Your procedure is scheduled on Tuesday, April 26,2022.  Report to [facility] Main Entrance "A" at 0545 A.M., then check in with the Admitting office.  Call this number if you have problems the morning of surgery:  [phone]   If you have any questions prior to your surgery date call [phone]: Open Monday-Friday 8am-4pm    Remember:  Do not eat after midnight the night before your surgery  You may drink clear liquids until 0430 the morning of your surgery.   Clear liquids allowed are: Water, Non-Citrus Juices (without pulp), Carbonated Beverages, Clear Tea, Black Coffee Only, and Gatorade    Take these medicines the morning of surgery with A SIP OF WATER :                                                                  loratadine (CLARITIN)                atorvastatin (LIPITOR)      As of t oday, STOP taking any Aspirin (unless otherwise instructed by your surgeon) Aleve, Naproxen, Ibuprofen, Motrin, Advil, Goody's, BC's, all herbal medications, fish oil, and all vitamins.                     Do not wear jewelry, make up, or nail polish            Do not wear lotions, powders, perfumes/colognes, or deodorant.            Do not shave 48 hours prior to surgery.  Men may shave face and neck.            Do not bring valuables to the hospital.            [facility] is not responsible for any belongings or valuables.  Do NOT Smoke (Tobacco/Vaping) or drink Alcohol 24 hours prior to your procedure If you use a CPAP at night, you may bring all equipment for your overnight stay.   Contacts, glasses, dentures or bridgework may not be worn into surgery, please bring cases for these belongings   For patients admitted to the hospital, discharge time will be determined by your treatment team.   Patients discharged the day of surgery will not be allowed to drive home, and someone needs to stay with them for 24 hours.    Special instructions:   [place]-  Preparing For Surgery  Before surgery, you can play an important role. Because skin is not sterile, your skin needs to be as free of germs as possible. You can reduce the number of germs on your skin by washing with CHG (chlorahexidine gluconate) Soap before surgery.  CHG is an antiseptic cleaner which kills germs and bonds with the skin to continue killing germs even after washing.    Oral Hygiene is also important to reduce your risk of infection.  Remember - BRUSH YOUR TEETH THE MORNING OF SURGERY WITH YOUR REGULAR TOOTHPASTE  Please do not use if you have an allergy to CHG or antibacterial soaps. If your skin becomes reddened/irritated stop using the CHG.  Do not shave (including legs and underarms) for at least 48 hours prior to first CHG shower. It is OK to  shave your face.  Please follow these instructions carefully.   1. Shower the NIGHT BEFORE SURGERY and the MORNING OF SURGERY  2. If you chose to wash your hair, wash your hair first as usual with your normal shampoo.  3. After you shampoo, rinse your hair and body thoroughly to remove the shampoo.  4. Wash Face and genitals (private parts) with your normal soap.   5.  Shower the NIGHT BEFORE SURGERY and the MORNING OF SURGERY with CHG Soap.   6. Use CHG Soap as you would any other liquid soap. You can apply CHG directly to the skin and wash gently with a scrungie or a clean washcloth.   7. Apply the CHG Soap to your body ONLY FROM THE NECK DOWN.  Do not use on open wounds or open sores. Avoid contact with your eyes, ears, mouth and genitals (private parts). Wash Face and genitals (private parts)  with your normal soap.   8. Wash thoroughly, paying special attention to the area where your surgery will be performed.  9. Thoroughly rinse your body with warm water from the neck down.  10. DO NOT shower/wash with your normal soap after using and rinsing off the CHG Soap.  11. Pat yourself dry with a CLEAN TOWEL.  12. Wear CLEAN  PAJAMAS to bed the night before surgery  13. Place CLEAN SHEETS on your bed the night before your surgery  14. DO NOT SLEEP WITH PETS.   Day of Surgery: Take a shower with CHG soap. Wear Clean/Comfortable clothing the morning of surgery Do not apply any deodorants/lotions.   Remember to brush your teeth WITH YOUR REGULAR TOOTHPASTE.   Please read over the following fact sheets that you were given.

## 2020-06-17 NOTE — Progress Notes (Addendum)
PCP - Delia Chimes Cardiologist - no   PPM/ICD - Denies   Chest x-ray -n/a  EKG -12/28/2019 Stress Test - denies ECHO -12/24/2019  Cardiac Cath - denies  Sleep Study - denies CPAP - denies  Fasting Blood Sugar - pt does not check blood sugar Checks Blood Sugar __0___ times a day A1C Obtained. cbgat PAT appointment was 66  Blood Thinner Instructions:Pt stopped taking Xarelto in mid-March per oncologist Aspirin Instructions: N/A  ERAS Protcol -yes PRE-SURGERY Ensure or G2- water   COVID TEST- scheduled for 06/20/2020   Anesthesia review: yes; seed placement scheduled for 06/23/2020  Patient denies shortness of breath, fever, cough and chest pain at PAT appointment   All instructions explained to the patient, with a verbal understanding of the material. Patient agrees to go over the instructions while at home for a better understanding. Patient also instructed to self quarantine after being tested for COVID-19. The opportunity to ask questions was provided.

## 2020-06-17 NOTE — Progress Notes (Addendum)
Surgical Instructions    Your procedure is scheduled on Tuesday, April 26,2022.  Report to [facility] Main Entrance "A" at 0545 A.M., then check in with the Admitting office.  Call this number if you have problems the morning of surgery:  [phone]   If you have any questions prior to your surgery date call [phone]: Open Monday-Friday 8am-4pm    Remember:  Do not eat after midnight the night before your surgery  You may drink clear liquids until 0430 the morning of your surgery.   Clear liquids allowed are: Water, Non-Citrus Juices (without pulp), Carbonated Beverages, Clear Tea, Black Coffee Only, and Gatorade Please complete your PRE-SURGERY water that was provided to you by 0430 the morning of surgery.  Please, if able, drink it in one setting. DO NOT SIP.     Take these medicines the morning of surgery with A SIP OF WATER :                                                     loratadine (CLARITIN)             atorvastatin (LIPITOR)                  Take these medicines the morning of surgery IF NEEDED:              acetaminophen (TYLENOL             albuterol (PROVENTIL HFA;VENTOLIN HFA)                                                                                       ALPRAZolam (XANAX)              dicyclomine (BENTYL)             ondansetron (ZOFRAN             oxyCODONE (OXY IR/ROXICODONE)             prochlorperazine (COMPAZINE)   Follow your surgeon's instructions on when to stop Xarelto.  If no instructions were given by your surgeon then you will need to call the office to get those instructions.     As of t oday, STOP taking any Aspirin (unless otherwise instructed by your surgeon) Aleve, Naproxen, Ibuprofen, Motrin, Advil, Goody's, BC's, all herbal medications, fish oil, and all vitamins.                     Do not wear jewelry, make up, or nail polish            Do not wear lotions, powders, perfumes/colognes, or deodorant.            Do not shave 48  hours prior to surgery.  Men may shave face and neck.            Do not bring valuables to the hospital.            [facility] is not responsible  for any belongings or valuables.  Do NOT Smoke (Tobacco/Vaping) or drink Alcohol 24 hours prior to your procedure If you use a CPAP at night, you may bring all equipment for your overnight stay.   Contacts, glasses, dentures or bridgework may not be worn into surgery, please bring cases for these belongings   For patients admitted to the hospital, discharge time will be determined by your treatment team.   Patients discharged the day of surgery will not be allowed to drive home, and someone needs to stay with them for 24 hours.    Special instructions:   [place]- Preparing For Surgery  Before surgery, you can play an important role. Because skin is not sterile, your skin needs to be as free of germs as possible. You can reduce the number of germs on your skin by washing with CHG (chlorahexidine gluconate) Soap before surgery.  CHG is an antiseptic cleaner which kills germs and bonds with the skin to continue killing germs even after washing.    Oral Hygiene is also important to reduce your risk of infection.  Remember - BRUSH YOUR TEETH THE MORNING OF SURGERY WITH YOUR REGULAR TOOTHPASTE  Please do not use if you have an allergy to CHG or antibacterial soaps. If your skin becomes reddened/irritated stop using the CHG.  Do not shave (including legs and underarms) for at least 48 hours prior to first CHG shower. It is OK to shave your face.  Please follow these instructions carefully.   1. Shower the NIGHT BEFORE SURGERY and the MORNING OF SURGERY  2. If you chose to wash your hair, wash your hair first as usual with your normal shampoo.  3. After you shampoo, rinse your hair and body thoroughly to remove the shampoo.  4. Wash Face and genitals (private parts) with your normal soap.   5.  Shower the NIGHT BEFORE SURGERY and the MORNING  OF SURGERY with CHG Soap.   6. Use CHG Soap as you would any other liquid soap. You can apply CHG directly to the skin and wash gently with a scrungie or a clean washcloth.   7. Apply the CHG Soap to your body ONLY FROM THE NECK DOWN.  Do not use on open wounds or open sores. Avoid contact with your eyes, ears, mouth and genitals (private parts). Wash Face and genitals (private parts)  with your normal soap.   8. Wash thoroughly, paying special attention to the area where your surgery will be performed.  9. Thoroughly rinse your body with warm water from the neck down.  10. DO NOT shower/wash with your normal soap after using and rinsing off the CHG Soap.  11. Pat yourself dry with a CLEAN TOWEL.  12. Wear CLEAN PAJAMAS to bed the night before surgery  13. Place CLEAN SHEETS on your bed the night before your surgery  14. DO NOT SLEEP WITH PETS.   Day of Surgery: Take a shower with CHG soap. Wear Clean/Comfortable clothing the morning of surgery Do not apply any deodorants/lotions.   Remember to brush your teeth WITH YOUR REGULAR TOOTHPASTE.   Please read over the following fact sheets that you were given.

## 2020-06-18 ENCOUNTER — Other Ambulatory Visit: Payer: Self-pay | Admitting: General Surgery

## 2020-06-18 DIAGNOSIS — Z170 Estrogen receptor positive status [ER+]: Secondary | ICD-10-CM

## 2020-06-18 NOTE — Telephone Encounter (Signed)
Dr. Lindi Adie or Lucianne Lei (since Dr. Lindi Adie is on [facility]),  For your approval or refusal. Gardiner Rhyme, RN

## 2020-06-20 ENCOUNTER — Other Ambulatory Visit (HOSPITAL_COMMUNITY)
Admission: RE | Admit: 2020-06-20 | Discharge: 2020-06-20 | Disposition: A | Discharge status: Home / Self Care | Payer: BC Managed Care – PPO | Source: Ambulatory Visit | Attending: General Surgery | Admitting: General Surgery

## 2020-06-20 DIAGNOSIS — Z01812 Encounter for preprocedural laboratory examination: Secondary | ICD-10-CM | POA: Insufficient documentation

## 2020-06-20 DIAGNOSIS — Z20822 Contact with and (suspected) exposure to covid-19: Secondary | ICD-10-CM | POA: Insufficient documentation

## 2020-06-21 LAB — SARS CORONAVIRUS 2 (TAT 6-24 HRS): SARS Coronavirus 2: NEGATIVE

## 2020-06-23 ENCOUNTER — Other Ambulatory Visit: Payer: Self-pay | Admitting: General Surgery

## 2020-06-23 ENCOUNTER — Ambulatory Visit
Admission: RE | Admit: 2020-06-23 | Discharge: 2020-06-23 | Disposition: A | Discharge status: Home / Self Care | Payer: BC Managed Care – PPO | Source: Ambulatory Visit | Attending: General Surgery | Admitting: General Surgery

## 2020-06-23 ENCOUNTER — Other Ambulatory Visit: Payer: Self-pay

## 2020-06-23 DIAGNOSIS — R928 Other abnormal and inconclusive findings on diagnostic imaging of breast: Secondary | ICD-10-CM | POA: Diagnosis not present

## 2020-06-23 DIAGNOSIS — Z170 Estrogen receptor positive status [ER+]: Secondary | ICD-10-CM

## 2020-06-23 DIAGNOSIS — C50411 Malignant neoplasm of upper-outer quadrant of right female breast: Secondary | ICD-10-CM

## 2020-06-23 DIAGNOSIS — R590 Localized enlarged lymph nodes: Secondary | ICD-10-CM | POA: Diagnosis not present

## 2020-06-23 NOTE — H&P (Signed)
Cathy Parrish Appointment: 05/27/2020 4:00 PM Location: [facility] Patient #: [id number] DOB: [birth year] Single / Language: Cathy Parrish / Race: Black or African American Female   History of Present Illness Stark Klein MD; 06/17/2020 5:13 PM) The patient is a 50 year old female who presents for a follow-up for Breast cancer. Pt is a 50 yo M referred for consultation by Dr. Shelly Bombard for a diagnosis of RIGHT breast cancer 11/2019. This was detected on a screening mammogram, but was also palpable at the time of diagnostic mammo and ultrasound. She had a poorly defined region in the upper outer quadrant that appeared to be multiple masses together on ultrasound with the total span being 6.4 cm. There was a 4.3 cm mass at 11:30 and a 1.9 cm mass at 12 o'clock. She also had a single abnormal lymph node seen on the right. 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 The lymph node pathology was negative, but discordant.  Of note, she had a prior core needle biopsy in 2013 with a residual clip that was just posterior to the abnormal region this time. Mammograms were performed last september (2020) and she required LEFT diagnostic mammogram with biopsies positive for fibroadenoma and reactive LN. She had no personal history of cancer before this diagnosis, but her sister was diagnosed with breast cancer in her 89s. She is a G2P1 wtih child born in her early 40s. Menarche was age 8.   Pt underwent neoadjuvant chemotherapy. Follow up MRI showed complete imaging response on the right. Her initial MR did show an abnormality on the left as well that was a CSL. This is recommended to be excised. She presents to discuss surgery. She would like to pursue breast conservation if possible.   MRI 04/2020  IMPRESSION: Complete imaging response to neoadjuvant chemotherapy.  dx mammo/us 11/30/2019 ACR  Breast Density Category c: The breast tissue is heterogeneously dense, which may obscure small masses.  FINDINGS: There is a poorly defined mass in the upper outer right breast that persists on the diagnostic imaging, what may be a conglomeration of masses, spanning approximately 5.8 cm in greatest dimension. The mass lies anterior to a dumbbell shaped biopsy clip from a stereotactic core needle biopsy performed in 2013. There are no other masses. There are no suspicious calcifications.  Mammographic images were processed with CAD.  On physical exam, there is a firm tender mass in the upper outer right breast.  Targeted ultrasound is performed, showing a hypoechoic irregular mass with ill-defined margins and significant internal vascularity on color Doppler analysis, centered at 11:30 o'clock, 4 cm the nipple, measuring 4.3 x 3.0 x 3.4 cm. In close proximity is a satellite mass, markedly hypoechoic, but with internal blood flow on color Doppler analysis, lying at 12 o'clock, 5 cm the nipple, measuring 1.9 x 1.4 x 1.5 cm. The total span between the 11:30 o'clock mass in the 12 o'clock mass is 6.4 cm.  Sonographic evaluation of the right axilla shows a single abnormal lymph node with a cortex thickened to 8 mm.  IMPRESSION: 1. Highly suspicious mass, what appears to be a conglomeration of masses, in the right breast at 11:30 o'clock, spanning 4.3 cm, with a highly suspicious satellite mass at 12 o'clock, measuring 1.9 cm. There is also an abnormal right axillary lymph node with a maximal cortical thickness of 8 mm.  RECOMMENDATION: 1. Three ultrasound-guided core needle  biopsies is recommended. Biopsy of the primary mass at 11:30 o'clock as well as biopsy of the smaller 12 o'clock position mass is recommended as well as biopsy of the single abnormal right axillary lymph node.  Right breast and axillary lymph node biopsies have been scheduled for October 14th.  I have  discussed the findings and recommendations with the patient. If applicable, a reminder letter will be sent to the patient regarding the next appointment.  BI-RADS CATEGORY 5: Highly suggestive of malignancy.  pathology 12/13/2019 1. Breast, right, needle core biopsy, 12 o'clock - INVASIVE MAMMARY CARCINOMA. The tumor cells are NEGATIVE for Her2 (0). Estrogen Receptor: 40%, POSITIVE, WEAK STAINING INTENSITY Progesterone Receptor: 0%, NEGATIVE Proliferation Marker Ki67: 40%  2. Breast, right, needle core biopsy, 11:30 o'clock - INVASIVE MAMMARY CARCINOMA. The tumor cells are NEGATIVE for Her2 (0). Estrogen Receptor: 90%, POSITIVE, MODERATE-WEAK STAINING INTENSITY Progesterone Receptor: 0%, NEGATIVE Proliferation Marker Ki67: 40%  3. Lymph node, needle/core biopsy, right axilla - ONE LYMPH NODE, NEGATIVE FOR CARCINOMA (0/1).     Allergies Janeann Forehand, CNA; 05/27/2020 4:04 PM) No Known Drug Allergies  [11/26/2016]: Allergies Reconciled   Medication History Janeann Forehand, CNA; 05/27/2020 4:04 PM) Valium (5MG Tablet, 1 (one) Oral as directed, Taken starting 12/21/2019) Active. Irbesartan-Hydrochlorothiazide (150-12.5MG Tablet, Oral) Active. Cefuroxime Axetil (250MG Tablet, Oral) Active. Etodolac (400MG Tablet, Oral) Active. Dicyclomine HCl (10MG Capsule, Oral) Active. Sertraline HCl (50MG Tablet, Oral) Active. Valsartan-Hydrochlorothiazide (160-12.5MG Tablet, Oral) Active. Medications Reconciled    Review of Systems Stark Klein MD; 06/17/2020 5:13 PM) All other systems negative   Physical Exam Stark Klein MD; 06/17/2020 5:14 PM) General Mental Status-Alert. General Appearance-Consistent with stated age. Hydration-Well hydrated. Voice-Normal.  Head and Neck Head-normocephalic, atraumatic with no lesions or palpable masses.  Eye Sclera/Conjunctiva - Bilateral-No scleral icterus.  Chest and Lung Exam Chest and lung exam reveals  -quiet, even and easy respiratory effort with no use of accessory muscles. Inspection Chest Wall - Normal. Back - normal.  Breast Note: right breast mass no longer palpable. no palpable LAD.   Cardiovascular Cardiovascular examination reveals -normal pedal pulses bilaterally. Note: regular rate and rhythm  Abdomen Inspection-Inspection Normal. Palpation/Percussion Palpation and Percussion of the abdomen reveal - Soft, Non Tender, No Rebound tenderness, No Rigidity (guarding) and No hepatosplenomegaly.  Peripheral Vascular Upper Extremity Inspection - Bilateral - Normal - No Clubbing, No Cyanosis, No Edema, Pulses Intact. Lower Extremity Palpation - Edema - Bilateral - No edema - Bilateral.  Neurologic Neurologic evaluation reveals -alert and oriented x 3 with no impairment of recent or remote memory. Mental Status-Normal.  Musculoskeletal Global Assessment -Note: no gross deformities.  Normal Exam - Left-Upper Extremity Strength Normal and Lower Extremity Strength Normal. Normal Exam - Right-Upper Extremity Strength Normal and Lower Extremity Strength Normal.  Lymphatic Head & Neck  General Head & Neck Lymphatics: Bilateral - Description - Normal. Axillary  General Axillary Region: Bilateral - Description - Normal. Tenderness - Non Tender.    Assessment & Plan Stark Klein MD; 06/17/2020 5:16 PM) MALIGNANT NEOPLASM OF UPPER-OUTER QUADRANT OF RIGHT BREAST IN FEMALE, ESTROGEN RECEPTOR POSITIVE (C50.411) Impression: Will plan right sided seed bracketed lumpectomy for the right breast cancer. Her original lymph node biopsy on the right was negative, but discordant, so we will target that node with a seed and make sure that one comes out wtih node surgery.  The surgical procedure was described to the patient. I discussed the incision type and location and that we would need radiology involved on with a  wire or seed marker and/or sentinel node.  The risks  and benefits of the procedure were described to the patient and she wishes to proceed.  We discussed the risks bleeding, infection, damage to other structures, need for further procedures/surgeries. We discussed the risk of seroma. The patient was advised if the area in the breast in cancer, we may need to go back to surgery for additional tissue to obtain negative margins or for a lymph node biopsy. The patient was advised that these are the most common complications, but that others can occur as well. They were advised against taking aspirin or other anti-inflammatory agents/blood thinners the week before surgery. Current Plans Pt Education - flb breast cancer surgery: discussed with patient and provided information. You are being scheduled for surgery- Our schedulers will call you.  You should hear from our office's scheduling department within 5 working days about the location, date, and time of surgery. We try to make accommodations for patient's preferences in scheduling surgery, but sometimes the OR schedule or the surgeon's schedule prevents [place] from making those accommodations.  If you have not heard from our office [phone]) in 5 working days, call the office and ask for your surgeon's nurse.  If you have other questions about your diagnosis, plan, or surgery, call the office and ask for your surgeon's nurse.    Surgery will be called:  Seed bracketed right breast lumpectomy, seed targeted axillary lymph node biopsy, sentinel lymph node mapping/biopsy, and possible port removal>  ABNORMAL MAGNETIC RESONANCE IMAGING OF LEFT BREAST (R92.8) Impression: Will plan seed localized excision of left breast lesion that was a CSL. FAMILY HISTORY OF BREAST CANCER (Z80.3) Impression: NEGATIVE genetic testing.    Signed electronically by Stark Klein, MD (06/17/2020 5:17 PM)

## 2020-06-24 ENCOUNTER — Ambulatory Visit (HOSPITAL_COMMUNITY): Payer: BC Managed Care – PPO | Admitting: Anesthesiology

## 2020-06-24 ENCOUNTER — Encounter (HOSPITAL_COMMUNITY): Payer: Self-pay | Admitting: General Surgery

## 2020-06-24 ENCOUNTER — Encounter (HOSPITAL_COMMUNITY)
Admission: RE | Disposition: A | Discharge status: Home / Self Care | Payer: Self-pay | Source: Ambulatory Visit | Attending: General Surgery

## 2020-06-24 ENCOUNTER — Ambulatory Visit
Admission: RE | Admit: 2020-06-24 | Discharge: 2020-06-24 | Disposition: A | Discharge status: Home / Self Care | Payer: BC Managed Care – PPO | Source: Ambulatory Visit | Attending: General Surgery | Admitting: General Surgery

## 2020-06-24 ENCOUNTER — Ambulatory Visit (HOSPITAL_COMMUNITY): Payer: BC Managed Care – PPO | Admitting: Physician Assistant

## 2020-06-24 ENCOUNTER — Ambulatory Visit (HOSPITAL_COMMUNITY)
Admission: RE | Admit: 2020-06-24 | Discharge: 2020-06-24 | Disposition: A | Discharge status: Home / Self Care | Payer: BC Managed Care – PPO | Source: Ambulatory Visit | Attending: General Surgery | Admitting: General Surgery

## 2020-06-24 ENCOUNTER — Other Ambulatory Visit: Payer: Self-pay

## 2020-06-24 ENCOUNTER — Ambulatory Visit
Admit: 2020-06-24 | Discharge: 2020-06-24 | Disposition: A | Discharge status: Home / Self Care | Payer: BC Managed Care – PPO | Attending: General Surgery | Admitting: General Surgery

## 2020-06-24 DIAGNOSIS — N61 Mastitis without abscess: Secondary | ICD-10-CM | POA: Diagnosis not present

## 2020-06-24 DIAGNOSIS — N6031 Fibrosclerosis of right breast: Secondary | ICD-10-CM | POA: Diagnosis not present

## 2020-06-24 DIAGNOSIS — Z9221 Personal history of antineoplastic chemotherapy: Secondary | ICD-10-CM | POA: Insufficient documentation

## 2020-06-24 DIAGNOSIS — N6012 Diffuse cystic mastopathy of left breast: Secondary | ICD-10-CM | POA: Insufficient documentation

## 2020-06-24 DIAGNOSIS — C50411 Malignant neoplasm of upper-outer quadrant of right female breast: Secondary | ICD-10-CM

## 2020-06-24 DIAGNOSIS — Z79899 Other long term (current) drug therapy: Secondary | ICD-10-CM | POA: Diagnosis not present

## 2020-06-24 DIAGNOSIS — N6489 Other specified disorders of breast: Secondary | ICD-10-CM | POA: Insufficient documentation

## 2020-06-24 DIAGNOSIS — R928 Other abnormal and inconclusive findings on diagnostic imaging of breast: Secondary | ICD-10-CM | POA: Diagnosis not present

## 2020-06-24 DIAGNOSIS — G8918 Other acute postprocedural pain: Secondary | ICD-10-CM | POA: Diagnosis not present

## 2020-06-24 DIAGNOSIS — Z170 Estrogen receptor positive status [ER+]: Secondary | ICD-10-CM

## 2020-06-24 DIAGNOSIS — Z853 Personal history of malignant neoplasm of breast: Secondary | ICD-10-CM | POA: Diagnosis not present

## 2020-06-24 DIAGNOSIS — N6311 Unspecified lump in the right breast, upper outer quadrant: Secondary | ICD-10-CM | POA: Diagnosis not present

## 2020-06-24 DIAGNOSIS — Z803 Family history of malignant neoplasm of breast: Secondary | ICD-10-CM | POA: Diagnosis not present

## 2020-06-24 DIAGNOSIS — Z452 Encounter for adjustment and management of vascular access device: Secondary | ICD-10-CM | POA: Insufficient documentation

## 2020-06-24 DIAGNOSIS — N6092 Unspecified benign mammary dysplasia of left breast: Secondary | ICD-10-CM | POA: Diagnosis not present

## 2020-06-24 DIAGNOSIS — R921 Mammographic calcification found on diagnostic imaging of breast: Secondary | ICD-10-CM | POA: Diagnosis not present

## 2020-06-24 DIAGNOSIS — N6320 Unspecified lump in the left breast, unspecified quadrant: Secondary | ICD-10-CM | POA: Diagnosis not present

## 2020-06-24 HISTORY — PX: BREAST LUMPECTOMY WITH RADIOACTIVE SEED AND SENTINEL LYMPH NODE BIOPSY: SHX6550

## 2020-06-24 HISTORY — PX: AXILLARY SENTINEL NODE BIOPSY: SHX5738

## 2020-06-24 HISTORY — PX: PORT-A-CATH REMOVAL: SHX5289

## 2020-06-24 HISTORY — PX: BREAST LUMPECTOMY: SHX2

## 2020-06-24 HISTORY — PX: SENTINEL NODE BIOPSY: SHX6608

## 2020-06-24 HISTORY — PX: RADIOACTIVE SEED GUIDED EXCISIONAL BREAST BIOPSY: SHX6490

## 2020-06-24 LAB — GLUCOSE, CAPILLARY
Glucose-Capillary: 145 mg/dL — ABNORMAL HIGH (ref 70–99)
Glucose-Capillary: 139 mg/dL — ABNORMAL HIGH (ref 70–99)

## 2020-06-24 SURGERY — BREAST LUMPECTOMY WITH RADIOACTIVE SEED AND SENTINEL LYMPH NODE BIOPSY
Anesthesia: General | Site: Chest | Laterality: Right

## 2020-06-24 MED ORDER — FENTANYL CITRATE (PF) 250 MCG/5ML IJ SOLN
INTRAMUSCULAR | Status: AC
  Filled 2020-06-24: qty 5

## 2020-06-24 MED ORDER — LACTATED RINGERS IV SOLN: INTRAVENOUS | Status: DC

## 2020-06-24 MED ORDER — ONDANSETRON HCL 4 MG/2ML IJ SOLN
INTRAMUSCULAR | Status: DC | PRN
  Administered 2020-06-24: 4 mg via INTRAVENOUS

## 2020-06-24 MED ORDER — OXYCODONE HCL 5 MG/5ML PO SOLN: 5.0000 mg | Freq: Once | ORAL | Status: DC | PRN

## 2020-06-24 MED ORDER — METHYLENE BLUE 0.5 % INJ SOLN
INTRAVENOUS | Status: AC
  Filled 2020-06-24: qty 10

## 2020-06-24 MED ORDER — CEFAZOLIN SODIUM-DEXTROSE 2-4 GM/100ML-% IV SOLN
2.0000 g | INTRAVENOUS | Status: AC
  Administered 2020-06-24: 2 g via INTRAVENOUS
  Filled 2020-06-24: qty 100

## 2020-06-24 MED ORDER — BUPIVACAINE-EPINEPHRINE (PF) 0.25% -1:200000 IJ SOLN
INTRAMUSCULAR | Status: AC
  Filled 2020-06-24: qty 30

## 2020-06-24 MED ORDER — OXYCODONE HCL 5 MG PO TABS: 5.0000 mg | ORAL_TABLET | Freq: Once | ORAL | Status: DC | PRN

## 2020-06-24 MED ORDER — DEXAMETHASONE SODIUM PHOSPHATE 10 MG/ML IJ SOLN
INTRAMUSCULAR | Status: DC | PRN
  Administered 2020-06-24: 5 mg via INTRAVENOUS

## 2020-06-24 MED ORDER — ORAL CARE MOUTH RINSE: 15.0000 mL | Freq: Once | OROMUCOSAL | Status: AC

## 2020-06-24 MED ORDER — PROPOFOL 10 MG/ML IV BOLUS
INTRAVENOUS | Status: DC | PRN
  Administered 2020-06-24: 150 mg via INTRAVENOUS
  Administered 2020-06-24: 50 mg via INTRAVENOUS

## 2020-06-24 MED ORDER — OXYCODONE HCL 5 MG PO TABS: 5.0000 mg | ORAL_TABLET | Freq: Four times a day (QID) | ORAL | 0 refills | Status: DC | PRN

## 2020-06-24 MED ORDER — MIDAZOLAM HCL 2 MG/2ML IJ SOLN
INTRAMUSCULAR | Status: DC | PRN
  Administered 2020-06-24: 2 mg via INTRAVENOUS

## 2020-06-24 MED ORDER — PHENYLEPHRINE 40 MCG/ML (10ML) SYRINGE FOR IV PUSH (FOR BLOOD PRESSURE SUPPORT)
PREFILLED_SYRINGE | INTRAVENOUS | Status: DC | PRN
  Administered 2020-06-24 (×2): 80 ug via INTRAVENOUS

## 2020-06-24 MED ORDER — CHLORHEXIDINE GLUCONATE 0.12 % MT SOLN
15.0000 mL | Freq: Once | OROMUCOSAL | Status: AC
  Administered 2020-06-24: 15 mL via OROMUCOSAL
  Filled 2020-06-24: qty 15

## 2020-06-24 MED ORDER — TECHNETIUM TC 99M TILMANOCEPT KIT
1.0000 | PACK | Freq: Once | INTRAVENOUS | Status: AC | PRN
  Administered 2020-06-24: 1 via INTRADERMAL

## 2020-06-24 MED ORDER — PROPOFOL 10 MG/ML IV BOLUS
INTRAVENOUS | Status: AC
  Filled 2020-06-24: qty 20

## 2020-06-24 MED ORDER — KETOROLAC TROMETHAMINE 30 MG/ML IJ SOLN
INTRAMUSCULAR | Status: DC | PRN
  Administered 2020-06-24: 30 mg via INTRAVENOUS

## 2020-06-24 MED ORDER — FENTANYL CITRATE (PF) 250 MCG/5ML IJ SOLN
INTRAMUSCULAR | Status: DC | PRN
  Administered 2020-06-24 (×8): 25 ug via INTRAVENOUS
  Administered 2020-06-24: 100 ug via INTRAVENOUS
  Administered 2020-06-24: 25 ug via INTRAVENOUS

## 2020-06-24 MED ORDER — ROPIVACAINE HCL 5 MG/ML IJ SOLN
INTRAMUSCULAR | Status: DC | PRN
  Administered 2020-06-24: 30 mL via PERINEURAL

## 2020-06-24 MED ORDER — HEMOSTATIC AGENTS (NO CHARGE) OPTIME
TOPICAL | Status: DC | PRN
  Administered 2020-06-24: 1 via TOPICAL

## 2020-06-24 MED ORDER — CHLORHEXIDINE GLUCONATE CLOTH 2 % EX PADS: 6.0000 | MEDICATED_PAD | Freq: Once | CUTANEOUS | Status: DC

## 2020-06-24 MED ORDER — AMISULPRIDE (ANTIEMETIC) 5 MG/2ML IV SOLN: 10.0000 mg | Freq: Once | INTRAVENOUS | Status: DC | PRN

## 2020-06-24 MED ORDER — 0.9 % SODIUM CHLORIDE (POUR BTL) OPTIME
TOPICAL | Status: DC | PRN
  Administered 2020-06-24: 1000 mL

## 2020-06-24 MED ORDER — ONDANSETRON HCL 4 MG/2ML IJ SOLN: 4.0000 mg | Freq: Once | INTRAMUSCULAR | Status: DC | PRN

## 2020-06-24 MED ORDER — ACETAMINOPHEN 500 MG PO TABS
1000.0000 mg | ORAL_TABLET | ORAL | Status: AC
  Administered 2020-06-24: 1000 mg via ORAL
  Filled 2020-06-24: qty 2

## 2020-06-24 MED ORDER — MIDAZOLAM HCL 2 MG/2ML IJ SOLN
INTRAMUSCULAR | Status: AC
  Filled 2020-06-24: qty 2

## 2020-06-24 MED ORDER — LIDOCAINE HCL 1 % IJ SOLN
INTRAMUSCULAR | Status: DC | PRN
  Administered 2020-06-24: 6 mL
  Administered 2020-06-24: 10 mL

## 2020-06-24 MED ORDER — FENTANYL CITRATE (PF) 100 MCG/2ML IJ SOLN
INTRAMUSCULAR | Status: AC
  Filled 2020-06-24: qty 2

## 2020-06-24 MED ORDER — SODIUM CHLORIDE (PF) 0.9 % IJ SOLN
INTRAMUSCULAR | Status: AC
  Filled 2020-06-24: qty 10

## 2020-06-24 MED ORDER — SODIUM CHLORIDE (PF) 0.9 % IJ SOLN
INTRAVENOUS | Status: DC | PRN
  Administered 2020-06-24: 5 mL

## 2020-06-24 MED ORDER — LIDOCAINE 2% (20 MG/ML) 5 ML SYRINGE
INTRAMUSCULAR | Status: DC | PRN
  Administered 2020-06-24: 100 mg via INTRAVENOUS

## 2020-06-24 MED ORDER — FENTANYL CITRATE (PF) 100 MCG/2ML IJ SOLN
25.0000 ug | INTRAMUSCULAR | Status: DC | PRN
  Administered 2020-06-24 (×2): 50 ug via INTRAVENOUS

## 2020-06-24 MED ORDER — LIDOCAINE HCL 1 % IJ SOLN
INTRAMUSCULAR | Status: AC
  Filled 2020-06-24: qty 20

## 2020-06-24 SURGICAL SUPPLY — 64 items
BINDER BREAST 3XL (GAUZE/BANDAGES/DRESSINGS) ×5 IMPLANT
BINDER BREAST LRG (GAUZE/BANDAGES/DRESSINGS) IMPLANT
BINDER BREAST XLRG (GAUZE/BANDAGES/DRESSINGS) IMPLANT
BNDG COHESIVE 4X5 TAN STRL (GAUZE/BANDAGES/DRESSINGS) ×5 IMPLANT
CANISTER SUCT 3000ML PPV (MISCELLANEOUS) ×5 IMPLANT
CHLORAPREP W/TINT 10.5 ML (MISCELLANEOUS) ×5 IMPLANT
CHLORAPREP W/TINT 26 (MISCELLANEOUS) ×5 IMPLANT
CLIP VESOCCLUDE LG 6/CT (CLIP) ×5 IMPLANT
CLIP VESOCCLUDE MED 24/CT (CLIP) ×5 IMPLANT
CLIP VESOCCLUDE MED 6/CT (CLIP) ×5 IMPLANT
CLIP VESOCCLUDE SM WIDE 6/CT (CLIP) ×5 IMPLANT
CNTNR URN SCR LID CUP LEK RST (MISCELLANEOUS) IMPLANT
CONT SPEC 4OZ STRL OR WHT (MISCELLANEOUS)
COVER PROBE W GEL 5X96 (DRAPES) ×5 IMPLANT
COVER SURGICAL LIGHT HANDLE (MISCELLANEOUS) ×5 IMPLANT
COVER WAND RF STERILE (DRAPES) ×5 IMPLANT
DECANTER SPIKE VIAL GLASS SM (MISCELLANEOUS) ×10 IMPLANT
DERMABOND ADVANCED (GAUZE/BANDAGES/DRESSINGS) ×3
DERMABOND ADVANCED .7 DNX12 (GAUZE/BANDAGES/DRESSINGS) ×12 IMPLANT
DEVICE DUBIN SPECIMEN MAMMOGRA (MISCELLANEOUS) ×15 IMPLANT
DRAPE CHEST BREAST 15X10 FENES (DRAPES) IMPLANT
DRAPE LAPAROTOMY 100X72 PEDS (DRAPES) ×5 IMPLANT
DRAPE SURG 17X23 STRL (DRAPES) IMPLANT
DRAPE UTILITY XL STRL (DRAPES) ×5 IMPLANT
DRSG PAD ABDOMINAL 8X10 ST (GAUZE/BANDAGES/DRESSINGS) ×10 IMPLANT
ELECT BLADE 4.0 EZ CLEAN MEGAD (MISCELLANEOUS) ×5
ELECT CAUTERY BLADE 6.4 (BLADE) ×5 IMPLANT
ELECT COATED BLADE 2.86 ST (ELECTRODE) ×5 IMPLANT
ELECT NEEDLE BLADE 2-5/6 (NEEDLE) ×5 IMPLANT
ELECT REM PT RETURN 9FT ADLT (ELECTROSURGICAL) ×5
ELECTRODE BLDE 4.0 EZ CLN MEGD (MISCELLANEOUS) ×4 IMPLANT
ELECTRODE REM PT RTRN 9FT ADLT (ELECTROSURGICAL) ×4 IMPLANT
GAUZE 4X4 16PLY RFD (DISPOSABLE) ×5 IMPLANT
GAUZE SPONGE 4X4 12PLY STRL (GAUZE/BANDAGES/DRESSINGS) ×15 IMPLANT
GOWN STRL REUS W/ TWL LRG LVL3 (GOWN DISPOSABLE) ×4 IMPLANT
GOWN STRL REUS W/TWL 2XL LVL3 (GOWN DISPOSABLE) ×5 IMPLANT
GOWN STRL REUS W/TWL LRG LVL3 (GOWN DISPOSABLE) ×5
HEMOSTAT ARISTA ABSORB 3G PWDR (HEMOSTASIS) ×5 IMPLANT
KIT BASIN OR (CUSTOM PROCEDURE TRAY) ×5 IMPLANT
KIT MARKER MARGIN INK (KITS) ×10 IMPLANT
KIT TURNOVER KIT B (KITS) ×5 IMPLANT
LIGHT WAVEGUIDE WIDE FLAT (MISCELLANEOUS) ×5 IMPLANT
NEEDLE 18GX1X1/2 (RX/OR ONLY) (NEEDLE) ×5 IMPLANT
NEEDLE FILTER BLUNT 18X 1/2SAF (NEEDLE) ×1
NEEDLE FILTER BLUNT 18X1 1/2 (NEEDLE) ×4 IMPLANT
NEEDLE HYPO 25GX1X1/2 BEV (NEEDLE) ×5 IMPLANT
NS IRRIG 1000ML POUR BTL (IV SOLUTION) ×5 IMPLANT
PACK GENERAL/GYN (CUSTOM PROCEDURE TRAY) ×5 IMPLANT
PACK UNIVERSAL I (CUSTOM PROCEDURE TRAY) ×5 IMPLANT
PAD ARMBOARD 7.5X6 YLW CONV (MISCELLANEOUS) ×10 IMPLANT
SPONGE LAP 18X18 RF (DISPOSABLE) ×5 IMPLANT
STOCKINETTE IMPERVIOUS 9X36 MD (GAUZE/BANDAGES/DRESSINGS) ×5 IMPLANT
STRIP CLOSURE SKIN 1/2X4 (GAUZE/BANDAGES/DRESSINGS) ×10 IMPLANT
SUT MNCRL AB 4-0 PS2 18 (SUTURE) ×10 IMPLANT
SUT MON AB 4-0 PC3 18 (SUTURE) ×5 IMPLANT
SUT MON AB 5-0 P3 18 (SUTURE) ×10 IMPLANT
SUT VIC AB 2-0 SH 27 (SUTURE) ×5
SUT VIC AB 2-0 SH 27XBRD (SUTURE) ×4 IMPLANT
SUT VIC AB 3-0 SH 27 (SUTURE) ×5
SUT VIC AB 3-0 SH 27X BRD (SUTURE) ×4 IMPLANT
SUT VIC AB 3-0 SH 8-18 (SUTURE) ×10 IMPLANT
SYR CONTROL 10ML LL (SYRINGE) ×5 IMPLANT
TOWEL GREEN STERILE (TOWEL DISPOSABLE) ×5 IMPLANT
TOWEL GREEN STERILE FF (TOWEL DISPOSABLE) ×5 IMPLANT

## 2020-06-24 NOTE — Anesthesia Preprocedure Evaluation (Addendum)
Anesthesia Evaluation  Patient identified by MRN, date of birth, ID band Patient awake    Reviewed: Allergy & Precautions, NPO status , Patient's Chart, lab work & pertinent test results  History of Anesthesia Complications Negative for: history of anesthetic complications  Airway Mallampati: II  TM Distance: >3 FB Neck ROM: Full    Dental  (+) Teeth Intact   Pulmonary asthma ,    Pulmonary exam normal        Cardiovascular hypertension, Normal cardiovascular exam     Neuro/Psych  Headaches, Anxiety Depression TIA   GI/Hepatic negative GI ROS, Neg liver ROS,   Endo/Other  diabetesMorbid obesity  Renal/GU negative Renal ROS  negative genitourinary   Musculoskeletal negative musculoskeletal ROS (+)   Abdominal   Peds  Hematology  (+) anemia ,   Anesthesia Other Findings  Breast cancer  Echo 12/27/19: 1. Left ventricular ejection fraction, by estimation, is 60 to 65%. The left ventricle has normal function. The left ventricle has no regional wall motion abnormalities. Left ventricular diastolic parameters were normal. The average left ventricular  global longitudinal strain is -18.4 %. The global longitudinal strain is normal. 2. Right ventricular systolic function is normal. The right ventricular size is normal. 3. The mitral valve is normal in structure. Trivial mitral valve regurgitation. 4. The aortic valve is normal in structure. There is mild thickening of the aortic valve. Aortic valve regurgitation is not visualized.  Reproductive/Obstetrics                           Anesthesia Physical Anesthesia Plan  ASA: III  Anesthesia Plan: General   Post-op Pain Management: GA combined w/ Regional for post-op pain   Induction: Intravenous  PONV Risk Score and Plan: 3 and Ondansetron, Dexamethasone, Midazolam and Treatment may vary due to age or medical condition  Airway Management  Planned: LMA  Additional Equipment: None  Intra-op Plan:   Post-operative Plan: Extubation in OR  Informed Consent: I have reviewed the patients History and Physical, chart, labs and discussed the procedure including the risks, benefits and alternatives for the proposed anesthesia with the patient or authorized representative who has indicated his/her understanding and acceptance.     Dental advisory given  Plan Discussed with:   Anesthesia Plan Comments:         Anesthesia Quick Evaluation

## 2020-06-24 NOTE — Interval H&P Note (Signed)
History and Physical Interval Note:  06/24/2020 7:30 AM  Cathy Parrish  has presented today for surgery, with the diagnosis of RIGHT BREAST  CANCER.  The various methods of treatment have been discussed with the patient and family. After consideration of risks, benefits and other options for treatment, the patient has consented to  Procedure(s): RIGHT BREAST LUMPECTOMY WITH RADIOACTIVE SEED X 2 AND SENTINEL LYMPH NODE MAPPING AND BIOPSY (Right) RADIOACTIVE SEED GUIDED EXCISIONAL BREAST BIOPSY (Right) REMOVAL PORT-A-CATH (N/A) as a surgical intervention.  The patient's history has been reviewed, patient examined, no change in status, stable for surgery.  I have reviewed the patient's chart and labs.  Questions were answered to the patient's satisfaction.     Stark Klein

## 2020-06-24 NOTE — Discharge Instructions (Addendum)
[facility] °Office Phone Number [phone] ° °BREAST BIOPSY/ PARTIAL MASTECTOMY: POST OP INSTRUCTIONS ° °Always review your discharge instruction sheet given to you by the facility where your surgery was performed. ° °IF YOU HAVE DISABILITY OR FAMILY LEAVE FORMS, YOU MUST BRING THEM TO THE OFFICE FOR PROCESSING.  DO NOT GIVE THEM TO YOUR DOCTOR. ° °1. A prescription for pain medication may be given to you upon discharge.  Take your pain medication as prescribed, if needed.  If narcotic pain medicine is not needed, then you may take acetaminophen (Tylenol) or ibuprofen (Advil) as needed. °2. Take your usually prescribed medications unless otherwise directed °3. If you need a refill on your pain medication, please contact your pharmacy.  They will contact our office to request authorization.  Prescriptions will not be filled after 5pm or on week-ends. °4. You should eat very light the first 24 hours after surgery, such as soup, crackers, pudding, etc.  Resume your normal diet the day after surgery. °5. Most patients will experience some swelling and bruising in the breast.  Ice packs and a good support bra will help.  Swelling and bruising can take several days to resolve.  °6. It is common to experience some constipation if taking pain medication after surgery.  Increasing fluid intake and taking a stool softener will usually help or prevent this problem from occurring.  A mild laxative (Milk of Magnesia or Miralax) should be taken according to package directions if there are no bowel movements after 48 hours. °7. Unless discharge instructions indicate otherwise, you may remove your bandages 48 hours after surgery, and you may shower at that time.  You may have steri-strips (small skin tapes) in place directly over the incision.  These strips should be left on the skin for 7-10 days.   Any sutures or staples will be removed at the office during your follow-up visit. °8. ACTIVITIES:  You may resume  regular daily activities (gradually increasing) beginning the next day.  Wearing a good support bra or sports bra (or the breast binder) minimizes pain and swelling.  You may have sexual intercourse when it is comfortable. °a. You may drive when you no longer are taking prescription pain medication, you can comfortably wear a seatbelt, and you can safely maneuver your car and apply brakes. °b. RETURN TO WORK:  __________1 week_______________ °9. You should see your doctor in the office for a follow-up appointment approximately two weeks after your surgery.  Your doctor’s nurse will typically make your follow-up appointment when she calls you with your pathology report.  Expect your pathology report 2-3 business days after your surgery.  You may call to check if you do not hear from us after three days. ° ° °WHEN TO CALL YOUR DOCTOR: °1. Fever over 101.0 °2. Nausea and/or vomiting. °3. Extreme swelling or bruising. °4. Continued bleeding from incision. °5. Increased pain, redness, or drainage from the incision. ° °The clinic staff is available to answer your questions during regular business hours.  Please don’t hesitate to call and ask to speak to one of the nurses for clinical concerns.  If you have a medical emergency, go to the nearest emergency room or call 911.  A surgeon from [facility] is always on call at the hospital. ° °For further questions, please visit [email or web address]  ° °

## 2020-06-24 NOTE — Op Note (Signed)
Left breast seed localized excisional biopsy, left subclavian port removal, right Breast Radioactive seed bracketed lumpectomy, right axillary seed localized excisional biopsy, and right axillary sentinel lymph node mapping and biopsy  Indications: This patient presents with history of right breast cancer, upper outer quadrant, cT3N0-1 (lymph node biopsy was discordant), weak ER+/ PR-, her 2 -, now s/p neoadjuvant chemotherapy  Pre-operative Diagnosis: right breast cancer  Post-operative Diagnosis: Same  Surgeon: Stark Klein   Anesthesia: General endotracheal anesthesia  ASA Class: 3  Procedure Details  The patient was seen in the Holding Room. The risks, benefits, complications, treatment options, and expected outcomes were discussed with the patient. The possibilities of bleeding, infection, the need for additional procedures, failure to diagnose a condition, and creating a complication requiring transfusion or operation were discussed with the patient. The patient concurred with the proposed plan, giving informed consent.  The site of surgery properly noted/marked. The patient was taken to Operating Room # 2, identified, and the procedure verified as left breast seed localized excisional biopsy, port removal, right breast seed bracketed lumpectomy, right axillary seed localized excisional lymph node biopsy, and right sentinel lymph node mapping and biopsy.  A Time Out was held and the above information confirmed. The right breast/chest/arm and the left breast and chest were prepped and draped in sterile fashion.  The left side was addressed first.  A inferolateral circumareolar incision was made near the previously placed radioactive seed.  Dissection was carried down around the point of maximum signal intensity. The cautery was used to perform the dissection.   The specimen was inked with the margin marker paint kit.    Specimen radiography confirmed inclusion of the mammographic lesion, the  clip, and the seed.  The background signal in the breast was zero.   Hemostasis was achieved with cautery.  The wound was irrigated and closed with 3-0 vicryl interrupted deep dermal sutures and 4-0 monocryl running subcuticular suture.    The prior port incision on the left upper chest was anesthetized with local anesthetic.  The incision was opened with a #15 blade.  The subcutaneous tissue was divided with the cautery.  The port was identified and the capsule opened.  The four 2-0 prolene sutures were removed.  The port was then removed and pressure held on the tract.  The catheter appeared intact without evidence of breakage, length was 24 cm.  The wound was inspected for hemostasis, which was achieved with cautery.  The wound was closed with 3-0 vicryl deep dermal interrupted sutures and 4-0 Monocryl running subcuticular suture.   The right side was then addressed. The lumpectomy was performed by creating a superolateral circumareolar incision near the previously placed radioactive seeds.  Dissection was carried down to around the point of maximum signal intensity. The cautery was used to perform the dissection.  Hemostasis was achieved with cautery. The edges of the cavity were marked with large clips, with one each medial, lateral, inferior and superior, and two clips posteriorly.   The specimen was inked with the margin marker paint kit.    Specimen radiography confirmed inclusion of the mammographic lesion, the clips, and the seeds.  The background signal in the breast was zero.  The wound was irrigated and closed with 3-0 vicryl in layers and 4-0 monocryl subcuticular suture.    Using a hand-held gamma probe, right axillary sentinel nodes were identified transcutaneously.  An oblique incision was created below the axillary hairline.  Dissection was carried through the clavipectoral fascia. The seed  containing node was located first.  This had cps 270 and was blue.  Lymphovascular channels were  clipped.   One additional deep level 2 axillary sentinel node was removed.  Counts per second were 5.    The background count was 0 cps.  The wound was irrigated.  Hemostasis was achieved with cautery.  The axillary incision was closed with a 3-0 vicryl deep dermal interrupted sutures and a 4-0 monocryl subcuticular closure.    All the incisions were dressed with dermabond.  Steristrips were placed on the breast incisions as well.  Sterile dressings were applied. At the end of the operation, all sponge, instrument, and needle counts were correct.  Findings: grossly clear surgical margins and no palpable adenopathy. On right side, posterior margin is pectoralis and anterior margin is skin.  On left side, anterior margin is skin.    Estimated Blood Loss:  30 mL         Specimens: Left breast tissue with seed, right breast tissue with two seeds, and right axillary sentinel node with seed, and one right axillary sentinel lymph node to pathology.  Port disposed of appropriately         Complications:  None; patient tolerated the procedure well.         Disposition: PACU - hemodynamically stable.         Condition: stable

## 2020-06-24 NOTE — Anesthesia Procedure Notes (Signed)
Procedure Name: LMA Insertion Date/Time: 06/24/2020 8:59 AM Performed by: Dorthea Cove, CRNA Pre-anesthesia Checklist: Patient identified, Emergency Drugs available, Suction available and Patient being monitored Patient Re-evaluated:Patient Re-evaluated prior to induction Oxygen Delivery Method: Circle System Utilized Preoxygenation: Pre-oxygenation with 100% oxygen Induction Type: IV induction Ventilation: Mask ventilation without difficulty LMA: LMA inserted LMA Size: 4.0 Number of attempts: 1 Airway Equipment and Method: Bite block Placement Confirmation: positive ETCO2 Tube secured with: Tape Dental Injury: Teeth and Oropharynx as per pre-operative assessment

## 2020-06-24 NOTE — Anesthesia Postprocedure Evaluation (Signed)
Anesthesia Post Note  Patient: Cathy Parrish  Procedure(s) Performed: RIGHT BREAST LUMPECTOMY WITH RADIOACTIVE SEED X 2 (Right Breast) RADIOACTIVE SEED GUIDED EXCISIONAL LEFT BREAST BIOPSY (Left Breast) REMOVAL PORT-A-CATH (Left Chest) RIGHT AXILLARY SENTINEL LYMPH NODE BIOPSY (Right Axilla) RADIOACTIVE SEED LOCALIZED RIGHT LYMPH NODE BIOPSY (Right Axilla)     Patient location during evaluation: PACU Anesthesia Type: General Level of consciousness: awake and alert Pain management: pain level controlled Vital Signs Assessment: post-procedure vital signs reviewed and stable Respiratory status: spontaneous breathing, nonlabored ventilation and respiratory function stable Cardiovascular status: blood pressure returned to baseline and stable Postop Assessment: no apparent nausea or vomiting Anesthetic complications: no   No complications documented.  Last Vitals:  Vitals:   06/24/20 1126 06/24/20 1141  BP: (!) 141/78 (!) 141/86  Pulse: 80 84  Resp: (!) 24 14  Temp:  (!) 36.1 C  SpO2: 97% 95%    Last Pain:  Vitals:   06/24/20 1141  PainSc: Asleep                 Lidia Collum

## 2020-06-24 NOTE — Anesthesia Procedure Notes (Signed)
Anesthesia Regional Block: Pectoralis block   Pre-Anesthetic Checklist: ,, timeout performed, Correct Patient, Correct Site, Correct Laterality, Correct Procedure, Correct Position, site marked, Risks and benefits discussed,  Surgical consent,  Pre-op evaluation,  At surgeon's request and post-op pain management  Laterality: Right  Prep: chloraprep       Needles:  Injection technique: Single-shot  Needle Type: Echogenic Stimulator Needle     Needle Length: 10cm  Needle Gauge: 20     Additional Needles:   Procedures:,,,, ultrasound used (permanent image in chart),,,,  Narrative:  Start time: 06/24/2020 7:09 AM End time: 06/24/2020 7:12 AM Injection made incrementally with aspirations every 5 mL.  Performed by: Personally  Anesthesiologist: Lidia Collum, MD  Additional Notes: Standard monitors applied. Skin prepped. Good needle visualization with ultrasound. Injection made in 5cc increments with no resistance to injection. Patient tolerated the procedure well.

## 2020-06-24 NOTE — Transfer of Care (Signed)
Immediate Anesthesia Transfer of Care Note  Patient: Cathy Parrish  Procedure(s) Performed: RIGHT BREAST LUMPECTOMY WITH RADIOACTIVE SEED X 2 (Right Breast) RADIOACTIVE SEED GUIDED EXCISIONAL LEFT BREAST BIOPSY (Left Breast) REMOVAL PORT-A-CATH (Left Chest) RIGHT AXILLARY SENTINEL LYMPH NODE BIOPSY (Right Axilla) RADIOACTIVE SEED LOCALIZED RIGHT LYMPH NODE BIOPSY (Right Axilla)  Patient Location: PACU  Anesthesia Type:General  Level of Consciousness: drowsy and patient cooperative  Airway & Oxygen Therapy: Patient Spontanous Breathing  Post-op Assessment: Report given to RN and Post -op Vital signs reviewed and stable  Post vital signs: Reviewed and stable  Last Vitals:  Vitals Value Taken Time  BP 137/101 06/24/20 1058  Temp    Pulse 88 06/24/20 1100  Resp 28 06/24/20 1100  SpO2 96 % 06/24/20 1100  Vitals shown include unvalidated device data.  Last Pain:  Vitals:   06/24/20 4098  PainSc: 0-No pain         Complications: No complications documented.

## 2020-06-25 ENCOUNTER — Encounter (HOSPITAL_COMMUNITY): Payer: Self-pay | Admitting: General Surgery

## 2020-06-26 LAB — SURGICAL PATHOLOGY

## 2020-06-30 ENCOUNTER — Other Ambulatory Visit: Payer: Self-pay | Admitting: *Deleted

## 2020-06-30 DIAGNOSIS — Z170 Estrogen receptor positive status [ER+]: Secondary | ICD-10-CM

## 2020-06-30 NOTE — Progress Notes (Signed)
Patient Care Team: Forrest Moron, MD as PCP - General (Internal Medicine) Rockwell Germany, RN as Oncology Nurse Navigator Nicholas Lose, MD as Consulting Physician (Hematology and Oncology) Mauro Kaufmann, RN as Oncology Nurse Navigator  DIAGNOSIS:    ICD-10-CM   1. Malignant neoplasm of upper-outer quadrant of right breast in female, estrogen receptor positive ([place])  C50.411    Z17.0     SUMMARY OF ONCOLOGIC HISTORY: Oncology History  Malignant neoplasm of upper-outer quadrant of right breast in female, estrogen receptor positive ([place])  12/13/2019 Initial Diagnosis   Screening detected right breast masses 11:30 position: 4.3 cm: Biopsy grade 3 IDC ER 90% PR 0%, KI 40%, HER-2 negative 12 o'clock position: 1.9 cm: Biopsy: Grade 3 IDC ER 40%, PR 0%, HER-2 negative, Ki-67 40%, right axillary lymph node biopsied benign but discordant   12/19/2019 Cancer Staging   Staging form: Breast, AJCC 8th Edition - Clinical stage from 12/19/2019: Stage IIIA (cT2, cN1, cM0, G3, ER+, PR-, HER2-) - Signed by Nicholas Lose, MD on 12/19/2019   01/03/2020 -  Chemotherapy    Patient is on Treatment Plan: BREAST DOSE DENSE AC Q14D / CARBOPLATIN D1 + PACLITAXEL D1,8,15 Q21D      02/28/2020 Genetic Testing   Negative genetic testing:  No pathogenic variants detected on the [facility] CustomNext-Cancer + RNAinsight panel. The report date is 02/28/2020.   The CustomNext-Cancer+RNAinsight panel offered by [facility] includes sequencing and rearrangement analysis for the following 47 genes:  APC, ATM, AXIN2, BARD1, BMPR1A, BRCA1, BRCA2, BRIP1, CDH1, CDK4, CDKN2A, CHEK2, DICER1, EPCAM, GREM1, HOXB13, MEN1, MLH1, MSH2, MSH3, MSH6, MUTYH, NBN, NF1, NF2, NTHL1, PALB2, PMS2, POLD1, POLE, PTEN, RAD51C, RAD51D, RECQL, RET, SDHA, SDHAF2, SDHB, SDHC, SDHD, SMAD4, SMARCA4, STK11, TP53, TSC1, TSC2, and VHL.  RNA data is routinely analyzed for use in variant interpretation for all genes.   06/24/2020 Surgery    Bilateral lumpectomies Barry Dienes):  Left breast: fibrocystic changes with usual ductal hyperplasia Right breast: no evidence of malignancy, with 2 right axillary lymph nodes negative for carcinoma     CHIEF COMPLIANT: Follow-up s/p bilateral lumpectomies   INTERVAL HISTORY: Cathy Parrish is a 50 y.o. with above-mentioned history of rightbreast who completed neoadjuvant chemotherapy. She underwent bilateral lumpectomies on 06/24/20 with Dr. Barry Dienes for which pathology showed in the left breast, fibrocystic changes with usual ductal hyperplasia, and in the right breast, no evidence of malignancy, with 2 right axillary lymph nodes negative for carcinoma. She presents to the clinic todayto review the pathology report and discuss further treatment.    ALLERGIES:  is allergic to latex.  MEDICATIONS:  Current Outpatient Medications  Medication Sig Dispense Refill  . acetaminophen (TYLENOL) 650 MG CR tablet Take 1,300 mg by mouth every 8 (eight) hours as needed for pain.    Marland Kitchen ALPRAZolam (XANAX) 0.5 MG tablet Take 1 tablet (0.5 mg total) by mouth at bedtime as needed for anxiety. 30 tablet 0  . atorvastatin (LIPITOR) 20 MG tablet Take 1 tablet (20 mg total) by mouth daily. 90 tablet 3  . Cholecalciferol (VITAMIN D) 125 MCG (5000 UT) CAPS Take 5,000 Units by mouth daily.    Marland Kitchen dicyclomine (BENTYL) 10 MG capsule Take 1 capsule (10 mg total) by mouth 3 (three) times daily as needed for spasms. 30 capsule 0  . ibuprofen (ADVIL) 800 MG tablet Take 800 mg by mouth every 8 (eight) hours as needed for moderate pain.    Marland Kitchen irbesartan-hydrochlorothiazide (AVALIDE) 150-12.5 MG tablet Take 1 tablet  by mouth daily. 90 tablet 2  . lidocaine-prilocaine (EMLA) cream Apply to affected area once (Patient taking differently: Apply 1 application topically daily as needed (port access).) 30 g 3  . loratadine (CLARITIN) 10 MG tablet Take 10 mg by mouth daily.    . ondansetron (ZOFRAN) 8 MG tablet Take 1 tablet (8 mg total)  by mouth 2 (two) times daily as needed. Start on the third day after chemotherapy. 30 tablet 1  . oxyCODONE (OXY IR/ROXICODONE) 5 MG immediate release tablet Take 1 tablet (5 mg total) by mouth every 6 (six) hours as needed for severe pain. 20 tablet 0  . potassium chloride SA (KLOR-CON) 20 MEQ tablet TAKE 1 TABLET(20 MEQ) BY MOUTH 1 TIME FOR 1 DOSE (Patient taking differently: Take 20 mEq by mouth daily.) 14 tablet 0  . prochlorperazine (COMPAZINE) 10 MG tablet TAKE 1 TABLET(10 MG) BY MOUTH EVERY 6 HOURS AS NEEDED FOR NAUSEA (Patient taking differently: Take 10 mg by mouth every 6 (six) hours as needed for nausea.) 30 tablet 1  . QUEtiapine (SEROQUEL) 25 MG tablet TAKE 1 TO 2 TABLETS(25 TO 50 MG) BY MOUTH AT BEDTIME (Patient taking differently: Take by mouth at bedtime as needed (sleep).) 90 tablet 3  . SUMAtriptan (IMITREX) 50 MG tablet Take one tablet by mouth at the first sign of headache. May repeat in 2 hours if headache persists or recurs. (Patient taking differently: Take 50 mg by mouth every 2 (two) hours as needed. May repeat in 2 hours if headache persists or recurs.) 10 tablet 0  . tetrahydrozoline 0.05 % ophthalmic solution Place 1 drop into both eyes daily as needed (dry/irritated eyes).     No current facility-administered medications for this visit.    PHYSICAL EXAMINATION: ECOG PERFORMANCE STATUS: 1 - Symptomatic but completely ambulatory  Vitals:   07/01/20 1405  BP: 103/71  Pulse: 97  Resp: 18  Temp: (!) 97.4 F (36.3 C)  SpO2: 99%   Filed Weights   07/01/20 1405  Weight: 258 lb (117 kg)    LABORATORY DATA:  I have reviewed the data as listed CMP Latest Ref Rng & Units 06/17/2020 05/01/2020 04/25/2020  Glucose 70 - 99 mg/dL 105(H) 122(H) 138(H)  BUN 6 - 20 mg/dL 11 9 11   Creatinine 0.44 - 1.00 mg/dL 0.66 0.69 0.66  Sodium 135 - 145 mmol/L 137 138 137  Potassium 3.5 - 5.1 mmol/L 3.3(L) 3.7 3.1(L)  Chloride 98 - 111 mmol/L 101 103 100  CO2 22 - 32 mmol/L 27 24 26    Calcium 8.9 - 10.3 mg/dL 9.3 8.4(L) 8.5(L)  Total Protein 6.5 - 8.1 g/dL - 6.5 6.6  Total Bilirubin 0.3 - 1.2 mg/dL - 0.6 0.5  Alkaline Phos 38 - 126 U/L - 78 92  AST 15 - 41 U/L - 14(L) 16  ALT 0 - 44 U/L - 18 19    Lab Results  Component Value Date   WBC 6.6 06/17/2020   HGB 11.7 (L) 06/17/2020   HCT 35.4 (L) 06/17/2020   MCV 95.4 06/17/2020   PLT 335 06/17/2020   NEUTROABS 1.5 (L) 05/01/2020    ASSESSMENT & PLAN:  Malignant neoplasm of upper-outer quadrant of right breast in female, estrogen receptor positive ([place]) 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%, HER-2 negative 12 o'clock position: 1.9 cm: Biopsy: Grade 3 IDC ER 40%(weakly positive), PR 0%, HER-2 negative, Ki-67 40%, right axillary lymph node biopsied benign  but discordant T2N?M0 stage IIa versus stage IIIa (based on the lymph node)  Treatment plan: 1.Neoadjuvant chemotherapy with dose dense Adriamycin and Cytoxan followed by Taxol(stopped after 7 cycles) and Carbo 2.Bilateral Lumpectomies: Complete response 2 SLN Neg 3.Adjuvant radiation 4.Followed by adjuvant antiestrogen therapy with abemaciclib -------------------------------------------------------------------------------------------------------------------------------------------------- Pathology counseling: I discussed the final pathology report of the patient provided  a copy of this report. I discussed the margins as well as lymph node surgeries. We also discussed the final staging along with previously performed ER/PR and HER-2/neu testing.   Current treatment: XRT followed by Anti estrogen therapy  Anastrozole counseling: We discussed the risks and benefits of anti-estrogen therapy with aromatase inhibitors. These include but not limited to insomnia, hot flashes, mood changes, vaginal dryness, bone density loss, and weight gain. We strongly believe that the benefits far  outweigh the risks. Patient understands these risks and consented to starting treatment. Planned treatment duration is 7 years.  Return to clinic in 4 months for survivorship care plan visit. I do not need to see her after radiation is complete.  No orders of the defined types were placed in this encounter.  The patient has a good understanding of the overall plan. she agrees with it. she will call with any problems that may develop before the next visit here.  Total time spent: 30 mins including face to face time and time spent for planning, charting and coordination of care  Rulon Eisenmenger, MD, MPH 07/01/2020  I, Cloyde Reams Dorshimer, am acting as scribe for Dr. Nicholas Lose.  I have reviewed the above documentation for accuracy and completeness, and I agree with the above.

## 2020-06-30 NOTE — Assessment & Plan Note (Signed)
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%, HER-2 negative 12 o'clock position: 1.9 cm: Biopsy: Grade 3 IDC ER 40%(weakly positive), PR 0%, HER-2 negative, Ki-67 40%, right axillary lymph node biopsied benign but discordant T2N?M0 stage IIa versus stage IIIa (based on the lymph node)  Treatment plan: 1.Neoadjuvant chemotherapy with dose dense Adriamycin and Cytoxan followed by Taxol(stopped after 7 cycles) and Carbo 2.Bilateral Lumpectomies: Complete response 2 SLN Neg 3.Adjuvant radiation 4.Followed by adjuvant antiestrogen therapy with abemaciclib -------------------------------------------------------------------------------------------------------------------------------------------------- Pathology counseling: I discussed the final pathology report of the patient provided  a copy of this report. I discussed the margins as well as lymph node surgeries. We also discussed the final staging along with previously performed ER/PR and HER-2/neu testing.   Current treatment: XRT followed by Anti estrogen therapy RTC after XRT

## 2020-07-01 ENCOUNTER — Encounter: Payer: Self-pay | Admitting: *Deleted

## 2020-07-01 ENCOUNTER — Other Ambulatory Visit: Payer: Self-pay

## 2020-07-01 ENCOUNTER — Inpatient Hospital Stay: Payer: BC Managed Care – PPO | Attending: Hematology and Oncology | Admitting: Hematology and Oncology

## 2020-07-01 ENCOUNTER — Encounter: Payer: Self-pay | Admitting: Licensed Clinical Social Worker

## 2020-07-01 DIAGNOSIS — Z9221 Personal history of antineoplastic chemotherapy: Secondary | ICD-10-CM | POA: Insufficient documentation

## 2020-07-01 DIAGNOSIS — Z170 Estrogen receptor positive status [ER+]: Secondary | ICD-10-CM | POA: Diagnosis not present

## 2020-07-01 DIAGNOSIS — C50411 Malignant neoplasm of upper-outer quadrant of right female breast: Secondary | ICD-10-CM | POA: Diagnosis not present

## 2020-07-01 DIAGNOSIS — Z79899 Other long term (current) drug therapy: Secondary | ICD-10-CM | POA: Insufficient documentation

## 2020-07-01 MED ORDER — ANASTROZOLE 1 MG PO TABS: 1.0000 mg | ORAL_TABLET | Freq: Every day | ORAL | 3 refills | Status: DC

## 2020-07-01 NOTE — Progress Notes (Signed)
[facility] CSW Progress Note  Holiday representative met with patient in exam room. Patient has finished chemo and had her surgery. She is doing well and is in good spirits. Pt reports that it has been helpful having time off post-surgery to heal physically and mentally. She is ready to start radiation in a few weeks and plans to return to work while undergoing treatment.  No needs at this time. Patient may reach out to CSW as needed through treatment.    [name] , LCSW

## 2020-07-21 ENCOUNTER — Other Ambulatory Visit: Payer: Self-pay | Admitting: Medical

## 2020-07-22 ENCOUNTER — Encounter: Payer: Self-pay | Admitting: Hematology and Oncology

## 2020-07-22 NOTE — Telephone Encounter (Signed)
Refill request

## 2020-07-23 ENCOUNTER — Ambulatory Visit
Admission: RE | Admit: 2020-07-23 | Discharge: 2020-07-23 | Disposition: A | Discharge status: Home / Self Care | Payer: BC Managed Care – PPO | Source: Ambulatory Visit | Attending: Radiation Oncology | Admitting: Radiation Oncology

## 2020-07-23 DIAGNOSIS — C50411 Malignant neoplasm of upper-outer quadrant of right female breast: Secondary | ICD-10-CM | POA: Diagnosis not present

## 2020-07-23 DIAGNOSIS — Z170 Estrogen receptor positive status [ER+]: Secondary | ICD-10-CM | POA: Diagnosis not present

## 2020-07-23 NOTE — Progress Notes (Signed)
Patient states that she does not have a pace marker and denies being pregnant.

## 2020-07-23 NOTE — Progress Notes (Signed)
Radiation Oncology         [phone] ________________________________  Name: Cathy Parrish        MRN: [id number]  Date of Service: 07/23/2020 DOB: [birth year]  [id number], Arlie Solomons, MD  Nicholas Lose, MD     REFERRING PHYSICIAN: Nicholas Lose, MD   DIAGNOSIS: The encounter diagnosis was Malignant neoplasm of upper-outer quadrant of right breast in female, estrogen receptor positive ([place]).   HISTORY OF PRESENT ILLNESS: Cathy Parrish is a 50 y.o. female with a diagnosis of right breast cancer. The patient was noted to have a screening detected mass in the right breast and she has a history of a benign right breast biopsy. Further diagnostic imaging revealed a 4.3 x 3.4 x 3 cm mass at 11:30, as well as a 12:00 mass measurign 1.9 x 1.5 x 1.4 cm, and an abnormal axillary node was seen. The sman of the two masses were 6.4 cm, and a biopsy of both masses were performed on 12/13/19. 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 Her node was biopsied as well and was benign but discordant. An MRI on 12/25/19 revealed her known disease spanning 5.4 x 4.5 x 4.3 cm, and nonmass enhancement was seen measurin 3.3 cm anterior-inferior to the biopsy proven disease. This added to the span totaling 7.6 cm,  And the left breast showed an 8 mm mass. A single abnormal right axillary node measured 7 mm.   Since the patient's last visit she did undergo biopsy of the left breast mass on 01/01/2020 which showed a complex sclerosing lesion with usual ductal hyperplasia and calcifications and additional right core biopsy was also consistent with fibrosis and focal mild chronic inflammation without malignancy.  She did have genetic testing which was negative.  She proceeded with neoadjuvant chemotherapy between 01/03/2020 and 05/01/2020.  She then went on to the OR for bilateral lumpectomies and right sentinel node biopsy on  06/24/2020.  Final pathology within the left breast showed fibrocystic change with usual ductal hyperplasia the complex sclerosing lesion was excised again showing usual ductal hyperplasia and calcifications without malignancy.  Her right breast lumpectomy specimen showed fibrosis with inflammation and hemosiderin no residual disease was present, and 2 sampled lymph nodes were negative for carcinoma.  She has recovered from surgery and is now ready to proceed with adjuvant radiotherapy.  She is seen today via MyChart to discuss next steps to coordinate this.    PREVIOUS RADIATION THERAPY: No   PAST MEDICAL HISTORY:  Past Medical History:  Diagnosis Date  . Anxiety   . Arthritis    shoulders  . Asthma   . Breast cancer ([place]) 11/2019  . Depression   . Diabetes mellitus without complication (HCC)    diet controlled  . Family history of breast cancer   . Family history of colon cancer   . Family history of liver cancer   . Headache   . Hypertension   . Stroke [facility])    TIA no deficits       PAST SURGICAL HISTORY: Past Surgical History:  Procedure Laterality Date  . AXILLARY SENTINEL NODE BIOPSY Right 06/24/2020   Procedure: RIGHT AXILLARY SENTINEL LYMPH NODE BIOPSY;  Surgeon: Stark Klein, MD;  Location: [place];  Service: General;  Laterality: Right;  . BREAST BIOPSY Right   . BREAST LUMPECTOMY WITH RADIOACTIVE SEED AND SENTINEL LYMPH NODE BIOPSY Right 06/24/2020  Procedure: RIGHT BREAST LUMPECTOMY WITH RADIOACTIVE SEED X 2;  Surgeon: Stark Klein, MD;  Location: [place];  Service: General;  Laterality: Right;  . COLONOSCOPY  07/13/2011   Procedure: COLONOSCOPY;  Surgeon: Gatha Mayer, MD;  Location: [place];  Service: Endoscopy;  Laterality: N/A;  . DILATION AND CURETTAGE OF UTERUS  abortion  . DILITATION & CURRETTAGE/HYSTROSCOPY WITH NOVASURE ABLATION N/A 09/28/2012   Procedure: DILATATION & CURETTAGE/HYSTEROSCOPY WITH NOVASURE ABLATION; And Resectoscope;  Surgeon: Marvene Staff, MD;  Location: [facility];  Service: Gynecology;  Laterality: N/A;  . ESOPHAGOGASTRODUODENOSCOPY  07/13/2011   Procedure: ESOPHAGOGASTRODUODENOSCOPY (EGD);  Surgeon: Gatha Mayer, MD;  Location: [facility] ENDOSCOPY;  Service: Endoscopy;  Laterality: N/A;  . PORT-A-CATH REMOVAL Left 06/24/2020   Procedure: REMOVAL PORT-A-CATH;  Surgeon: Stark Klein, MD;  Location: [place];  Service: General;  Laterality: Left;  . PORTACATH PLACEMENT N/A 01/02/2020   Procedure: INSERTION PORT-A-CATH;  Surgeon: Stark Klein, MD;  Location: [place];  Service: General;  Laterality: N/A;  . RADIOACTIVE SEED GUIDED EXCISIONAL BREAST BIOPSY Left 06/24/2020   Procedure: RADIOACTIVE SEED GUIDED EXCISIONAL LEFT BREAST BIOPSY;  Surgeon: Stark Klein, MD;  Location: [place];  Service: General;  Laterality: Left;  . SENTINEL NODE BIOPSY Right 06/24/2020   Procedure: RADIOACTIVE SEED LOCALIZED RIGHT LYMPH NODE BIOPSY;  Surgeon: Stark Klein, MD;  Location: [place];  Service: General;  Laterality: Right;  . TONSILLECTOMY       FAMILY HISTORY:  Family History  Problem Relation Age of Onset  . Breast cancer Half-Sister 46  . Liver cancer Father        dx early 36s  . Breast cancer Maternal Aunt        dx 46s  . Colon cancer Maternal Aunt        dx 27s  . Cancer Paternal Uncle        unknown type, dx >50     SOCIAL HISTORY:  reports that she has never smoked. She has never used smokeless tobacco. She reports previous alcohol use. She reports that she does not use drugs. The patient is single and lives in [place]. 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 She's been working remotely from home during the pandemic.   ALLERGIES: Latex   MEDICATIONS:  Current Outpatient Medications  Medication Sig Dispense Refill  . acetaminophen (TYLENOL) 650 MG CR tablet Take 1,300 mg by mouth every 8 (eight) hours as needed for pain.    Marland Kitchen ALPRAZolam (XANAX) 0.5 MG tablet TAKE 1 TABLET(0.5 MG) BY MOUTH AT BEDTIME  AS NEEDED FOR ANXIETY 30 tablet 3  . anastrozole (ARIMIDEX) 1 MG tablet Take 1 tablet (1 mg total) by mouth daily. 90 tablet 3  . atorvastatin (LIPITOR) 20 MG tablet Take 1 tablet (20 mg total) by mouth daily. 90 tablet 3  . Cholecalciferol (VITAMIN D) 125 MCG (5000 UT) CAPS Take 5,000 Units by mouth daily.    Marland Kitchen dicyclomine (BENTYL) 10 MG capsule Take 1 capsule (10 mg total) by mouth 3 (three) times daily as needed for spasms. 30 capsule 0  . ibuprofen (ADVIL) 800 MG tablet Take 800 mg by mouth every 8 (eight) hours as needed for moderate pain.    Marland Kitchen irbesartan-hydrochlorothiazide (AVALIDE) 150-12.5 MG tablet Take 1 tablet by mouth daily. 90 tablet 2  . loratadine (CLARITIN) 10 MG tablet Take 10 mg by mouth daily.    Marland Kitchen oxyCODONE (OXY IR/ROXICODONE) 5 MG immediate release tablet Take 1 tablet (5 mg  total) by mouth every 6 (six) hours as needed for severe pain. 20 tablet 0  . potassium chloride SA (KLOR-CON) 20 MEQ tablet TAKE 1 TABLET(20 MEQ) BY MOUTH 1 TIME FOR 1 DOSE (Patient taking differently: Take 20 mEq by mouth daily.) 14 tablet 0  . prochlorperazine (COMPAZINE) 10 MG tablet TAKE 1 TABLET(10 MG) BY MOUTH EVERY 6 HOURS AS NEEDED FOR NAUSEA (Patient taking differently: Take 10 mg by mouth every 6 (six) hours as needed for nausea.) 30 tablet 1  . QUEtiapine (SEROQUEL) 25 MG tablet TAKE 1 TO 2 TABLETS(25 TO 50 MG) BY MOUTH AT BEDTIME (Patient taking differently: Take by mouth at bedtime as needed (sleep).) 90 tablet 3  . SUMAtriptan (IMITREX) 50 MG tablet Take one tablet by mouth at the first sign of headache. May repeat in 2 hours if headache persists or recurs. (Patient taking differently: Take 50 mg by mouth every 2 (two) hours as needed. May repeat in 2 hours if headache persists or recurs.) 10 tablet 0  . tetrahydrozoline 0.05 % ophthalmic solution Place 1 drop into both eyes daily as needed (dry/irritated eyes).     No current facility-administered medications for this encounter.     REVIEW  OF SYSTEMS: On review of systems, the patient reports that she is doing well overall. She has had some tiredness and soreness from her surgery. She describes self limiting nerve like pain but that this is somewhat improved each few days. No other concerns are verbalized at this time.     PHYSICAL EXAM:  Unable to assess vitals given encounter type.  In general this is a well appearing African American female in no acute distress. She's alert and oriented x4 and appropriate throughout the examination. Cardiopulmonary assessment is negative for acute distress and she exhibits normal effort. Bilateral breast exam is deferred.    ECOG = 0  0 - Asymptomatic (Fully active, able to carry on all predisease activities without restriction)  1 - Symptomatic but completely ambulatory (Restricted in physically strenuous activity but ambulatory and able to carry out work of a light or sedentary nature. For example, light housework, office work)  2 - Symptomatic, <50% in bed during the day (Ambulatory and capable of all self care but unable to carry out any work activities. Up and about more than 50% of waking hours)  3 - Symptomatic, >50% in bed, but not bedbound (Capable of only limited self-care, confined to bed or chair 50% or more of waking hours)  4 - Bedbound (Completely disabled. Cannot carry on any self-care. Totally confined to bed or chair)  5 - Death   Eustace Pen MM, Creech RH, Tormey DC, et al. [phone]). "Toxicity and response criteria of the [facility]". [place] Oncol. 5 (6): 649-55    LABORATORY DATA:  Lab Results  Component Value Date   WBC 6.6 06/17/2020   HGB 11.7 (L) 06/17/2020   HCT 35.4 (L) 06/17/2020   MCV 95.4 06/17/2020   PLT 335 06/17/2020   Lab Results  Component Value Date   NA 137 06/17/2020   K 3.3 (L) 06/17/2020   CL 101 06/17/2020   CO2 27 06/17/2020   Lab Results  Component Value Date   ALT 18 05/01/2020   AST 14 (L) 05/01/2020    ALKPHOS 78 05/01/2020   BILITOT 0.6 05/01/2020      RADIOGRAPHY: NM Sentinel Node Inj-No Rpt (Breast)  Result Date: 06/24/2020 Sulfur colloid was injected by the nuclear medicine technologist for  melanoma sentinel node.   MM BREAST SURGICAL SPECIMEN  Result Date: 06/24/2020 CLINICAL DATA:  Status post right lymph node resection. EXAM: SPECIMEN RADIOGRAPH OF THE RIGHT BREAST COMPARISON:  Previous exam(s). FINDINGS: Status post excision of the right breast. The radioactive seed and biopsy marker clip are present, completely intact, and were marked for pathology. IMPRESSION: Specimen radiograph of the right breast. Electronically Signed   By: Lovey Newcomer M.D.   On: 06/24/2020 10:58   MM Breast Surgical Specimen  Result Date: 06/24/2020 CLINICAL DATA:  Patient status post right breast lumpectomy. EXAM: SPECIMEN RADIOGRAPH OF THE RIGHT BREAST COMPARISON:  Previous exam(s). FINDINGS: Status post excision of the right breast. The radioactive seeds and biopsy marker clips are present, completely intact, and were marked for pathology. IMPRESSION: Specimen radiograph of the right breast. Electronically Signed   By: Lovey Newcomer M.D.   On: 06/24/2020 10:56   MM Breast Surgical Specimen  Result Date: 06/24/2020 CLINICAL DATA:  Patient status post left breast excision. EXAM: SPECIMEN RADIOGRAPH OF THE LEFT BREAST COMPARISON:  Previous exam(s). FINDINGS: Status post excision of the left breast. The radioactive seed and biopsy marker clip are present, completely intact, and were marked for pathology. IMPRESSION: Specimen radiograph of the left breast. Electronically Signed   By: Lovey Newcomer M.D.   On: 06/24/2020 09:44   MM LT RADIOACTIVE SEED LOC MAMMO GUIDE  Result Date: 06/23/2020 CLINICAL DATA:  Patient for preoperative localization prior to left excision. EXAM: MAMMOGRAPHIC GUIDED RADIOACTIVE SEED LOCALIZATION OF THE BILATERAL BREAST COMPARISON:  Previous exam(s). FINDINGS: Patient presents for  radioactive seed localization prior to right breast lumpectomy (2 sites) hand left breast excision. I met with the patient and we discussed the procedure of seed localization including benefits and alternatives. We discussed the high likelihood of a successful procedure. We discussed the risks of the procedure including infection, bleeding, tissue injury and further surgery. We discussed the low dose of radioactivity involved in the procedure. Informed, written consent was given. The usual time-out protocol was performed immediately prior to the procedure. Right breast: Site 1: Using mammographic guidance, sterile technique, 1% lidocaine and an I-125 radioactive seed, coil shaped clip was localized using a cranial approach. The follow-up mammogram images confirm the seed in the expected location and were marked for Dr. Barry Dienes. Follow-up survey of the patient confirms presence of the radioactive seed. Order number of I-125 seed:  [id number]. Total activity:  8.016 millicuries reference Date: 05/16/2020 Site 2: Using mammographic guidance, sterile technique, 1% lidocaine and an I-125 radioactive seed, heart shaped clip was localized using a cranial approach. The follow-up mammogram images confirm the seed in the expected location and were marked for Dr. Barry Dienes. Follow-up survey of the patient confirms presence of the radioactive seed. Order number of I-125 seed:  [id number]. Total activity:  7.867 millicuries reference Date: 05/16/2020 Left breast: Using mammographic guidance, sterile technique, 1% lidocaine and an I-125 radioactive seed, dumbbell-shaped clip was localized using a cranial approach. The follow-up mammogram images confirm the seed in the expected location and were marked for Dr. Barry Dienes. Follow-up survey of the patient confirms presence of the radioactive seed. Order number of I-125 seed:  [id number]. Total activity:  7.121 millicuries reference Date: 05/16/2020 The patient tolerated the procedure well and  was released from the [facility]. She was given instructions regarding seed removal. IMPRESSION: Radioactive seed localization bilateral breast. No apparent complications. Electronically Signed   By: Lovey Newcomer M.D.   On: 06/23/2020 14:45   MM RT  RADIOACTIVE SEED LOC MAMMO GUIDE  Result Date: 06/23/2020 CLINICAL DATA:  Patient for preoperative localization prior to left excision. EXAM: MAMMOGRAPHIC GUIDED RADIOACTIVE SEED LOCALIZATION OF THE BILATERAL BREAST COMPARISON:  Previous exam(s). FINDINGS: Patient presents for radioactive seed localization prior to right breast lumpectomy (2 sites) hand left breast excision. I met with the patient and we discussed the procedure of seed localization including benefits and alternatives. We discussed the high likelihood of a successful procedure. We discussed the risks of the procedure including infection, bleeding, tissue injury and further surgery. We discussed the low dose of radioactivity involved in the procedure. Informed, written consent was given. The usual time-out protocol was performed immediately prior to the procedure. Right breast: Site 1: Using mammographic guidance, sterile technique, 1% lidocaine and an I-125 radioactive seed, coil shaped clip was localized using a cranial approach. The follow-up mammogram images confirm the seed in the expected location and were marked for Dr. Barry Dienes. Follow-up survey of the patient confirms presence of the radioactive seed. Order number of I-125 seed:  [id number]. Total activity:  7.619 millicuries reference Date: 05/16/2020 Site 2: Using mammographic guidance, sterile technique, 1% lidocaine and an I-125 radioactive seed, heart shaped clip was localized using a cranial approach. The follow-up mammogram images confirm the seed in the expected location and were marked for Dr. Barry Dienes. Follow-up survey of the patient confirms presence of the radioactive seed. Order number of I-125 seed:  [id number]. Total activity:  4.580  millicuries reference Date: 05/16/2020 Left breast: Using mammographic guidance, sterile technique, 1% lidocaine and an I-125 radioactive seed, dumbbell-shaped clip was localized using a cranial approach. The follow-up mammogram images confirm the seed in the expected location and were marked for Dr. Barry Dienes. Follow-up survey of the patient confirms presence of the radioactive seed. Order number of I-125 seed:  [id number]. Total activity:  5.397 millicuries reference Date: 05/16/2020 The patient tolerated the procedure well and was released from the [facility]. She was given instructions regarding seed removal. IMPRESSION: Radioactive seed localization bilateral breast. No apparent complications. Electronically Signed   By: Lovey Newcomer M.D.   On: 06/23/2020 14:45   MM RT RADIO SEED EA ADD LESION LOC MAMMO  Result Date: 06/23/2020 CLINICAL DATA:  Patient for preoperative localization prior to left excision. EXAM: MAMMOGRAPHIC GUIDED RADIOACTIVE SEED LOCALIZATION OF THE BILATERAL BREAST COMPARISON:  Previous exam(s). FINDINGS: Patient presents for radioactive seed localization prior to right breast lumpectomy (2 sites) hand left breast excision. I met with the patient and we discussed the procedure of seed localization including benefits and alternatives. We discussed the high likelihood of a successful procedure. We discussed the risks of the procedure including infection, bleeding, tissue injury and further surgery. We discussed the low dose of radioactivity involved in the procedure. Informed, written consent was given. The usual time-out protocol was performed immediately prior to the procedure. Right breast: Site 1: Using mammographic guidance, sterile technique, 1% lidocaine and an I-125 radioactive seed, coil shaped clip was localized using a cranial approach. The follow-up mammogram images confirm the seed in the expected location and were marked for Dr. Barry Dienes. Follow-up survey of the patient confirms  presence of the radioactive seed. Order number of I-125 seed:  [id number]. Total activity:  0.240 millicuries reference Date: 05/16/2020 Site 2: Using mammographic guidance, sterile technique, 1% lidocaine and an I-125 radioactive seed, heart shaped clip was localized using a cranial approach. The follow-up mammogram images confirm the seed in the expected location and were marked for Dr. Barry Dienes. Follow-up survey  of the patient confirms presence of the radioactive seed. Order number of I-125 seed:  [id number]. Total activity:  5.277 millicuries reference Date: 05/16/2020 Left breast: Using mammographic guidance, sterile technique, 1% lidocaine and an I-125 radioactive seed, dumbbell-shaped clip was localized using a cranial approach. The follow-up mammogram images confirm the seed in the expected location and were marked for Dr. Barry Dienes. Follow-up survey of the patient confirms presence of the radioactive seed. Order number of I-125 seed:  [id number]. Total activity:  4.431 millicuries reference Date: 05/16/2020 The patient tolerated the procedure well and was released from the [facility]. She was given instructions regarding seed removal. IMPRESSION: Radioactive seed localization bilateral breast. No apparent complications. Electronically Signed   By: Lovey Newcomer M.D.   On: 06/23/2020 14:45   [place] RT RADIOACTIVE SEED EA ADD LESION  Result Date: 06/23/2020 CLINICAL DATA:  Patient for preoperative localization prior to right breast lumpectomy, left breast excision and right targeted lymph node resection. EXAM: ULTRASOUND GUIDED RADIOACTIVE SEED LOCALIZATION OF THE RIGHT AXILLA COMPARISON:  Previous exam(s). FINDINGS: Patient presents for radioactive seed localization prior to targeted right axillary lymph node removal. I met with the patient and we discussed the procedure of seed localization including benefits and alternatives. We discussed the high likelihood of a successful procedure. We discussed the risks of  the procedure including infection, bleeding, tissue injury and further surgery. We discussed the low dose of radioactivity involved in the procedure. Informed, written consent was given. The usual time-out protocol was performed immediately prior to the procedure. Using ultrasound guidance, sterile technique, 1% lidocaine and an I-125 radioactive seed, right axillary lymph node was localized using a lateral approach. The follow-up mammogram images confirm the seed in the expected location and were marked for Dr. Barry Dienes. Follow-up survey of the patient confirms presence of the radioactive seed. Order number of I-125 seed:  [id number]. Total activity:  9.509 millicuries reference Date: 06/02/2020 The patient tolerated the procedure well and was released from the [facility]. She was given instructions regarding seed removal. IMPRESSION: Radioactive seed localization right axilla. No apparent complications. Electronically Signed   By: Lovey Newcomer M.D.   On: 06/23/2020 14:46   MM CLIP PLACEMENT RIGHT  Result Date: 06/23/2020 CLINICAL DATA:  Status post ultrasound-guided placement of seed into a right axillary lymph node. EXAM: DIAGNOSTIC RIGHT MAMMOGRAM POST ULTRASOUND-GUIDED RADIOACTIVE SEED PLACEMENT COMPARISON:  Previous exam(s). FINDINGS: Mammographic images were obtained following ultrasound-guided radioactive seed placement. These demonstrate the radioactive seed within the targeted right axillary lymph node. IMPRESSION: Appropriate location of the radioactive seed in the targeted right axillary lymph node. Final Assessment: Post Procedure Mammograms for Seed Placement Electronically Signed   By: Lovey Newcomer M.D.   On: 06/23/2020 14:47       IMPRESSION/PLAN: 1. 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 Dr. Lisbeth Renshaw discusses the pathology findings and reviews the nature of right breast disease.  Fortunately she did not have any further findings of concern  in the left breast and did have definitive resection of the complex sclerosing lesion.  She has done well since chemotherapy and since her right breast surgery as well.  Regarding her lymph node status, her original biopsy was discordant, clinically her MRI scan external radiotherapy to the breast prior to neoadjuvant treatment on 12/25/2019 continue to show morphologically abnormal appearing lymph node in the right axilla but again was discordant with biopsy. Her lymphadenopathy did clear following neoadjuvant chemotherapy and no abnormal appearing  lymph nodes were noted on the MRI from 05/09/2020. Dr. Lisbeth Renshaw discusses the rationale for adjuvant radiotherapy to the right breast to reduce risks of local recurrence followed by antiestrogen therapy. We discussed the risks, benefits, short, and long term effects of radiotherapy, as well as the curative intent, and the patient is interested in proceeding with radiotherapy to the breast and regional nodes after we discussed all the scenarios for treatment. Dr. Lisbeth Renshaw discusses the delivery and logistics of radiotherapy and anticipates a course of 6 1/2 weeks of radiotherapy to the right breast and regional nodes. She will come in for simulation tomorrow at which time she will sign written consent to proceed.   This encounter was provided by telemedicine platform Mychart that converted to telephone encounter.  The patient has provided two factor identification and has given verbal consent for this type of encounter and has been advised to only accept a meeting of this type in a secure network environment. The time spent during this encounter was 45 minutes including preparation, discussion, and coordination of the patient's care. The attendants for this meeting include Dr. Lisbeth Renshaw, Hayden Pedro  and Livia Snellen.  During the encounter, Dr. Lisbeth Renshaw, and Hayden Pedro were located at [facility] Radiation Oncology Department.  Tyia Jovita Persing was located at home.     The above documentation reflects my direct findings during this shared patient visit. Please see the separate note by Dr. Lisbeth Renshaw on this date for the remainder of the patient's plan of care.    Carola Rhine, PAC

## 2020-07-24 ENCOUNTER — Ambulatory Visit
Admission: RE | Admit: 2020-07-24 | Discharge: 2020-07-24 | Disposition: A | Discharge status: Home / Self Care | Payer: BC Managed Care – PPO | Source: Ambulatory Visit | Attending: Radiation Oncology | Admitting: Radiation Oncology

## 2020-07-24 ENCOUNTER — Other Ambulatory Visit: Payer: Self-pay

## 2020-07-24 DIAGNOSIS — C50411 Malignant neoplasm of upper-outer quadrant of right female breast: Secondary | ICD-10-CM | POA: Diagnosis not present

## 2020-07-24 DIAGNOSIS — Z170 Estrogen receptor positive status [ER+]: Secondary | ICD-10-CM | POA: Diagnosis not present

## 2020-07-24 DIAGNOSIS — Z510 Encounter for antineoplastic radiation therapy: Secondary | ICD-10-CM | POA: Diagnosis not present

## 2020-07-31 ENCOUNTER — Encounter: Payer: Self-pay | Admitting: *Deleted

## 2020-08-01 DIAGNOSIS — Z170 Estrogen receptor positive status [ER+]: Secondary | ICD-10-CM | POA: Insufficient documentation

## 2020-08-01 DIAGNOSIS — C50411 Malignant neoplasm of upper-outer quadrant of right female breast: Secondary | ICD-10-CM | POA: Insufficient documentation

## 2020-08-04 ENCOUNTER — Other Ambulatory Visit: Payer: Self-pay

## 2020-08-04 ENCOUNTER — Ambulatory Visit
Admission: RE | Admit: 2020-08-04 | Discharge: 2020-08-04 | Disposition: A | Discharge status: Home / Self Care | Payer: BC Managed Care – PPO | Source: Ambulatory Visit | Attending: Radiation Oncology | Admitting: Radiation Oncology

## 2020-08-04 DIAGNOSIS — Z170 Estrogen receptor positive status [ER+]: Secondary | ICD-10-CM | POA: Diagnosis not present

## 2020-08-04 DIAGNOSIS — C50411 Malignant neoplasm of upper-outer quadrant of right female breast: Secondary | ICD-10-CM | POA: Diagnosis not present

## 2020-08-05 ENCOUNTER — Encounter: Payer: Self-pay | Admitting: Radiation Oncology

## 2020-08-05 ENCOUNTER — Other Ambulatory Visit: Payer: Self-pay

## 2020-08-05 ENCOUNTER — Ambulatory Visit
Admission: RE | Admit: 2020-08-05 | Discharge: 2020-08-05 | Disposition: A | Discharge status: Home / Self Care | Payer: BC Managed Care – PPO | Source: Ambulatory Visit | Attending: Radiation Oncology | Admitting: Radiation Oncology

## 2020-08-05 ENCOUNTER — Ambulatory Visit: Admission: RE | Admit: 2020-08-05 | Payer: BC Managed Care – PPO | Source: Ambulatory Visit

## 2020-08-05 DIAGNOSIS — Z170 Estrogen receptor positive status [ER+]: Secondary | ICD-10-CM | POA: Diagnosis not present

## 2020-08-05 DIAGNOSIS — C50411 Malignant neoplasm of upper-outer quadrant of right female breast: Secondary | ICD-10-CM | POA: Diagnosis not present

## 2020-08-06 ENCOUNTER — Ambulatory Visit: Payer: BC Managed Care – PPO

## 2020-08-07 ENCOUNTER — Ambulatory Visit
Admission: RE | Admit: 2020-08-07 | Discharge: 2020-08-07 | Disposition: A | Discharge status: Home / Self Care | Payer: BC Managed Care – PPO | Source: Ambulatory Visit | Attending: Radiation Oncology | Admitting: Radiation Oncology

## 2020-08-07 DIAGNOSIS — C50411 Malignant neoplasm of upper-outer quadrant of right female breast: Secondary | ICD-10-CM | POA: Diagnosis not present

## 2020-08-07 DIAGNOSIS — Z170 Estrogen receptor positive status [ER+]: Secondary | ICD-10-CM | POA: Diagnosis not present

## 2020-08-08 ENCOUNTER — Other Ambulatory Visit: Payer: Self-pay

## 2020-08-08 ENCOUNTER — Ambulatory Visit
Admission: RE | Admit: 2020-08-08 | Discharge: 2020-08-08 | Disposition: A | Discharge status: Home / Self Care | Payer: BC Managed Care – PPO | Source: Ambulatory Visit | Attending: Radiation Oncology | Admitting: Radiation Oncology

## 2020-08-08 DIAGNOSIS — C50411 Malignant neoplasm of upper-outer quadrant of right female breast: Secondary | ICD-10-CM | POA: Diagnosis not present

## 2020-08-08 DIAGNOSIS — Z170 Estrogen receptor positive status [ER+]: Secondary | ICD-10-CM

## 2020-08-08 MED ORDER — ALRA NON-METALLIC DEODORANT (RAD-ONC)
1.0000 "application " | Freq: Once | TOPICAL | Status: AC
  Administered 2020-08-08: 1 via TOPICAL

## 2020-08-08 MED ORDER — RADIAPLEXRX EX GEL: Freq: Once | CUTANEOUS | Status: AC

## 2020-08-08 NOTE — Progress Notes (Signed)

## 2020-08-11 ENCOUNTER — Other Ambulatory Visit: Payer: Self-pay

## 2020-08-11 ENCOUNTER — Ambulatory Visit
Admission: RE | Admit: 2020-08-11 | Discharge: 2020-08-11 | Disposition: A | Discharge status: Home / Self Care | Payer: BC Managed Care – PPO | Source: Ambulatory Visit | Attending: Radiation Oncology | Admitting: Radiation Oncology

## 2020-08-11 DIAGNOSIS — C50411 Malignant neoplasm of upper-outer quadrant of right female breast: Secondary | ICD-10-CM | POA: Diagnosis not present

## 2020-08-11 DIAGNOSIS — Z170 Estrogen receptor positive status [ER+]: Secondary | ICD-10-CM | POA: Diagnosis not present

## 2020-08-12 ENCOUNTER — Ambulatory Visit
Admission: RE | Admit: 2020-08-12 | Discharge: 2020-08-12 | Disposition: A | Discharge status: Home / Self Care | Payer: BC Managed Care – PPO | Source: Ambulatory Visit | Attending: Radiation Oncology | Admitting: Radiation Oncology

## 2020-08-12 ENCOUNTER — Other Ambulatory Visit: Payer: Self-pay

## 2020-08-12 DIAGNOSIS — Z170 Estrogen receptor positive status [ER+]: Secondary | ICD-10-CM | POA: Diagnosis not present

## 2020-08-12 DIAGNOSIS — C50411 Malignant neoplasm of upper-outer quadrant of right female breast: Secondary | ICD-10-CM | POA: Diagnosis not present

## 2020-08-13 ENCOUNTER — Ambulatory Visit: Payer: BC Managed Care – PPO | Admitting: Radiation Oncology

## 2020-08-13 ENCOUNTER — Ambulatory Visit: Admission: RE | Admit: 2020-08-13 | Payer: BC Managed Care – PPO | Source: Ambulatory Visit

## 2020-08-14 ENCOUNTER — Ambulatory Visit
Admission: RE | Admit: 2020-08-14 | Discharge: 2020-08-14 | Disposition: A | Discharge status: Home / Self Care | Payer: BC Managed Care – PPO | Source: Ambulatory Visit | Attending: Radiation Oncology | Admitting: Radiation Oncology

## 2020-08-14 ENCOUNTER — Other Ambulatory Visit: Payer: Self-pay

## 2020-08-14 DIAGNOSIS — C50411 Malignant neoplasm of upper-outer quadrant of right female breast: Secondary | ICD-10-CM | POA: Diagnosis not present

## 2020-08-14 DIAGNOSIS — Z170 Estrogen receptor positive status [ER+]: Secondary | ICD-10-CM | POA: Diagnosis not present

## 2020-08-15 ENCOUNTER — Ambulatory Visit
Admission: RE | Admit: 2020-08-15 | Discharge: 2020-08-15 | Disposition: A | Discharge status: Home / Self Care | Payer: BC Managed Care – PPO | Source: Ambulatory Visit | Attending: Radiation Oncology | Admitting: Radiation Oncology

## 2020-08-15 ENCOUNTER — Other Ambulatory Visit: Payer: Self-pay

## 2020-08-15 DIAGNOSIS — Z170 Estrogen receptor positive status [ER+]: Secondary | ICD-10-CM | POA: Diagnosis not present

## 2020-08-15 DIAGNOSIS — C50411 Malignant neoplasm of upper-outer quadrant of right female breast: Secondary | ICD-10-CM | POA: Diagnosis not present

## 2020-08-16 ENCOUNTER — Ambulatory Visit: Payer: BC Managed Care – PPO

## 2020-08-18 ENCOUNTER — Ambulatory Visit
Admission: RE | Admit: 2020-08-18 | Discharge: 2020-08-18 | Disposition: A | Discharge status: Home / Self Care | Payer: BC Managed Care – PPO | Source: Ambulatory Visit | Attending: Radiation Oncology | Admitting: Radiation Oncology

## 2020-08-18 DIAGNOSIS — C50411 Malignant neoplasm of upper-outer quadrant of right female breast: Secondary | ICD-10-CM | POA: Diagnosis not present

## 2020-08-18 DIAGNOSIS — Z170 Estrogen receptor positive status [ER+]: Secondary | ICD-10-CM | POA: Diagnosis not present

## 2020-08-19 ENCOUNTER — Other Ambulatory Visit: Payer: Self-pay

## 2020-08-19 ENCOUNTER — Ambulatory Visit
Admission: RE | Admit: 2020-08-19 | Discharge: 2020-08-19 | Disposition: A | Discharge status: Home / Self Care | Payer: BC Managed Care – PPO | Source: Ambulatory Visit | Attending: Radiation Oncology | Admitting: Radiation Oncology

## 2020-08-19 DIAGNOSIS — C50411 Malignant neoplasm of upper-outer quadrant of right female breast: Secondary | ICD-10-CM | POA: Diagnosis not present

## 2020-08-19 DIAGNOSIS — Z170 Estrogen receptor positive status [ER+]: Secondary | ICD-10-CM | POA: Diagnosis not present

## 2020-08-20 ENCOUNTER — Telehealth: Payer: Self-pay | Admitting: *Deleted

## 2020-08-20 ENCOUNTER — Ambulatory Visit
Admission: RE | Admit: 2020-08-20 | Discharge: 2020-08-20 | Disposition: A | Discharge status: Home / Self Care | Payer: BC Managed Care – PPO | Source: Ambulatory Visit | Attending: Radiation Oncology | Admitting: Radiation Oncology

## 2020-08-20 ENCOUNTER — Other Ambulatory Visit: Payer: Self-pay | Admitting: Hematology and Oncology

## 2020-08-20 DIAGNOSIS — C50411 Malignant neoplasm of upper-outer quadrant of right female breast: Secondary | ICD-10-CM | POA: Diagnosis not present

## 2020-08-20 DIAGNOSIS — Z170 Estrogen receptor positive status [ER+]: Secondary | ICD-10-CM | POA: Diagnosis not present

## 2020-08-20 MED ORDER — GABAPENTIN 300 MG PO CAPS: 300.0000 mg | ORAL_CAPSULE | Freq: Every day | ORAL | 3 refills | Status: DC

## 2020-08-20 NOTE — Telephone Encounter (Signed)
Cathy Parrish left a message stating she is now taking RT. She continues to have "excruciating pain in her feet and legs from neuropathy". Is asking for a prescription for gabapentin.

## 2020-08-20 NOTE — Progress Notes (Signed)
Worsening neuropathy in the feet: Sent a prescription for gabapentin 3 mg at bedtime.

## 2020-08-21 ENCOUNTER — Ambulatory Visit
Admission: RE | Admit: 2020-08-21 | Discharge: 2020-08-21 | Disposition: A | Discharge status: Home / Self Care | Payer: BC Managed Care – PPO | Source: Ambulatory Visit | Attending: Radiation Oncology | Admitting: Radiation Oncology

## 2020-08-21 ENCOUNTER — Other Ambulatory Visit: Payer: Self-pay

## 2020-08-21 DIAGNOSIS — Z170 Estrogen receptor positive status [ER+]: Secondary | ICD-10-CM | POA: Diagnosis not present

## 2020-08-21 DIAGNOSIS — C50411 Malignant neoplasm of upper-outer quadrant of right female breast: Secondary | ICD-10-CM | POA: Diagnosis not present

## 2020-08-22 ENCOUNTER — Ambulatory Visit
Admission: RE | Admit: 2020-08-22 | Discharge: 2020-08-22 | Disposition: A | Discharge status: Home / Self Care | Payer: BC Managed Care – PPO | Source: Ambulatory Visit | Attending: Radiation Oncology | Admitting: Radiation Oncology

## 2020-08-22 DIAGNOSIS — C50411 Malignant neoplasm of upper-outer quadrant of right female breast: Secondary | ICD-10-CM | POA: Diagnosis not present

## 2020-08-22 DIAGNOSIS — Z170 Estrogen receptor positive status [ER+]: Secondary | ICD-10-CM | POA: Diagnosis not present

## 2020-08-25 ENCOUNTER — Ambulatory Visit
Admission: RE | Admit: 2020-08-25 | Discharge: 2020-08-25 | Disposition: A | Discharge status: Home / Self Care | Payer: BC Managed Care – PPO | Source: Ambulatory Visit | Attending: Radiation Oncology | Admitting: Radiation Oncology

## 2020-08-25 ENCOUNTER — Other Ambulatory Visit: Payer: Self-pay

## 2020-08-25 DIAGNOSIS — C50411 Malignant neoplasm of upper-outer quadrant of right female breast: Secondary | ICD-10-CM | POA: Diagnosis not present

## 2020-08-25 DIAGNOSIS — Z170 Estrogen receptor positive status [ER+]: Secondary | ICD-10-CM | POA: Diagnosis not present

## 2020-08-26 ENCOUNTER — Ambulatory Visit
Admission: RE | Admit: 2020-08-26 | Discharge: 2020-08-26 | Disposition: A | Discharge status: Home / Self Care | Payer: BC Managed Care – PPO | Source: Ambulatory Visit | Attending: Radiation Oncology | Admitting: Radiation Oncology

## 2020-08-26 DIAGNOSIS — C50411 Malignant neoplasm of upper-outer quadrant of right female breast: Secondary | ICD-10-CM | POA: Diagnosis not present

## 2020-08-26 DIAGNOSIS — Z170 Estrogen receptor positive status [ER+]: Secondary | ICD-10-CM | POA: Diagnosis not present

## 2020-08-27 ENCOUNTER — Ambulatory Visit
Admission: RE | Admit: 2020-08-27 | Discharge: 2020-08-27 | Disposition: A | Discharge status: Home / Self Care | Payer: BC Managed Care – PPO | Source: Ambulatory Visit | Attending: Radiation Oncology | Admitting: Radiation Oncology

## 2020-08-27 ENCOUNTER — Other Ambulatory Visit: Payer: Self-pay

## 2020-08-27 DIAGNOSIS — C50411 Malignant neoplasm of upper-outer quadrant of right female breast: Secondary | ICD-10-CM | POA: Diagnosis not present

## 2020-08-27 DIAGNOSIS — Z170 Estrogen receptor positive status [ER+]: Secondary | ICD-10-CM | POA: Diagnosis not present

## 2020-08-28 ENCOUNTER — Other Ambulatory Visit: Payer: Self-pay

## 2020-08-28 ENCOUNTER — Ambulatory Visit
Admission: RE | Admit: 2020-08-28 | Discharge: 2020-08-28 | Disposition: A | Discharge status: Home / Self Care | Payer: BC Managed Care – PPO | Source: Ambulatory Visit | Attending: Radiation Oncology | Admitting: Radiation Oncology

## 2020-08-28 DIAGNOSIS — C50411 Malignant neoplasm of upper-outer quadrant of right female breast: Secondary | ICD-10-CM | POA: Diagnosis not present

## 2020-08-28 DIAGNOSIS — Z170 Estrogen receptor positive status [ER+]: Secondary | ICD-10-CM | POA: Diagnosis not present

## 2020-08-29 ENCOUNTER — Ambulatory Visit
Admission: RE | Admit: 2020-08-29 | Discharge: 2020-08-29 | Disposition: A | Discharge status: Home / Self Care | Payer: BC Managed Care – PPO | Source: Ambulatory Visit | Attending: Radiation Oncology | Admitting: Radiation Oncology

## 2020-08-29 DIAGNOSIS — C50411 Malignant neoplasm of upper-outer quadrant of right female breast: Secondary | ICD-10-CM | POA: Diagnosis not present

## 2020-08-29 DIAGNOSIS — Z170 Estrogen receptor positive status [ER+]: Secondary | ICD-10-CM | POA: Diagnosis not present

## 2020-09-02 ENCOUNTER — Ambulatory Visit
Admission: RE | Admit: 2020-09-02 | Discharge: 2020-09-02 | Disposition: A | Discharge status: Home / Self Care | Payer: BC Managed Care – PPO | Source: Ambulatory Visit | Attending: Radiation Oncology | Admitting: Radiation Oncology

## 2020-09-02 DIAGNOSIS — Z170 Estrogen receptor positive status [ER+]: Secondary | ICD-10-CM | POA: Diagnosis not present

## 2020-09-02 DIAGNOSIS — C50411 Malignant neoplasm of upper-outer quadrant of right female breast: Secondary | ICD-10-CM | POA: Diagnosis not present

## 2020-09-03 ENCOUNTER — Ambulatory Visit
Admission: RE | Admit: 2020-09-03 | Discharge: 2020-09-03 | Disposition: A | Discharge status: Home / Self Care | Payer: BC Managed Care – PPO | Source: Ambulatory Visit | Attending: Radiation Oncology | Admitting: Radiation Oncology

## 2020-09-03 ENCOUNTER — Other Ambulatory Visit: Payer: Self-pay

## 2020-09-03 DIAGNOSIS — Z170 Estrogen receptor positive status [ER+]: Secondary | ICD-10-CM | POA: Diagnosis not present

## 2020-09-03 DIAGNOSIS — C50411 Malignant neoplasm of upper-outer quadrant of right female breast: Secondary | ICD-10-CM | POA: Diagnosis not present

## 2020-09-04 ENCOUNTER — Other Ambulatory Visit: Payer: Self-pay

## 2020-09-04 ENCOUNTER — Ambulatory Visit
Admission: RE | Admit: 2020-09-04 | Discharge: 2020-09-04 | Disposition: A | Discharge status: Home / Self Care | Payer: BC Managed Care – PPO | Source: Ambulatory Visit | Attending: Radiation Oncology | Admitting: Radiation Oncology

## 2020-09-04 DIAGNOSIS — C50411 Malignant neoplasm of upper-outer quadrant of right female breast: Secondary | ICD-10-CM | POA: Diagnosis not present

## 2020-09-04 DIAGNOSIS — Z170 Estrogen receptor positive status [ER+]: Secondary | ICD-10-CM | POA: Diagnosis not present

## 2020-09-05 ENCOUNTER — Ambulatory Visit: Payer: BC Managed Care – PPO | Admitting: Radiation Oncology

## 2020-09-05 ENCOUNTER — Ambulatory Visit
Admission: RE | Admit: 2020-09-05 | Discharge: 2020-09-05 | Disposition: A | Discharge status: Home / Self Care | Payer: BC Managed Care – PPO | Source: Ambulatory Visit | Attending: Radiation Oncology | Admitting: Radiation Oncology

## 2020-09-05 ENCOUNTER — Telehealth: Payer: Self-pay | Admitting: Hematology and Oncology

## 2020-09-05 ENCOUNTER — Encounter: Payer: Self-pay | Admitting: Radiation Oncology

## 2020-09-05 DIAGNOSIS — Z170 Estrogen receptor positive status [ER+]: Secondary | ICD-10-CM

## 2020-09-05 DIAGNOSIS — C50411 Malignant neoplasm of upper-outer quadrant of right female breast: Secondary | ICD-10-CM

## 2020-09-05 MED ORDER — ALRA NON-METALLIC DEODORANT (RAD-ONC)
1.0000 | Freq: Once | TOPICAL | Status: AC
Start: 2020-09-05 — End: 2020-09-05
  Administered 2020-09-05: 1 via TOPICAL

## 2020-09-05 NOTE — Telephone Encounter (Signed)
Scheduled appt per 7/8 sch msg. Pt aware.  

## 2020-09-08 ENCOUNTER — Other Ambulatory Visit: Payer: Self-pay

## 2020-09-08 ENCOUNTER — Ambulatory Visit
Admission: RE | Admit: 2020-09-08 | Discharge: 2020-09-08 | Disposition: A | Discharge status: Home / Self Care | Payer: BC Managed Care – PPO | Source: Ambulatory Visit | Attending: Radiation Oncology | Admitting: Radiation Oncology

## 2020-09-08 DIAGNOSIS — Z170 Estrogen receptor positive status [ER+]: Secondary | ICD-10-CM | POA: Diagnosis not present

## 2020-09-08 DIAGNOSIS — C50411 Malignant neoplasm of upper-outer quadrant of right female breast: Secondary | ICD-10-CM | POA: Diagnosis not present

## 2020-09-08 NOTE — Progress Notes (Signed)
Patient Care Team: Forrest Moron, MD as PCP - General (Internal Medicine) Rockwell Germany, RN as Oncology Nurse Navigator Nicholas Lose, MD as Consulting Physician (Hematology and Oncology) Mauro Kaufmann, RN as Oncology Nurse Navigator  DIAGNOSIS:    ICD-10-CM   1. Malignant neoplasm of upper-outer quadrant of right breast in female, estrogen receptor positive ([place])  C50.411    Z17.0       SUMMARY OF ONCOLOGIC HISTORY: Oncology History  Malignant neoplasm of upper-outer quadrant of right breast in female, estrogen receptor positive ([place])  12/13/2019 Initial Diagnosis   Screening detected right breast masses 11:30 position: 4.3 cm: Biopsy grade 3 IDC ER 90% PR 0%, KI 40%, HER-2 negative 12 o'clock position: 1.9 cm: Biopsy: Grade 3 IDC ER 40%, PR 0%, HER-2 negative, Ki-67 40%, right axillary lymph node biopsied benign but discordant   12/19/2019 Cancer Staging   Staging form: Breast, AJCC 8th Edition - Clinical stage from 12/19/2019: Stage IIIA (cT2, cN1, cM0, G3, ER+, PR-, HER2-) - Signed by Nicholas Lose, MD on 12/19/2019    01/03/2020 -  Chemotherapy    Patient is on Treatment Plan: BREAST DOSE DENSE AC Q14D / CARBOPLATIN D1 + PACLITAXEL D1,8,15 Q21D       02/28/2020 Genetic Testing   Negative genetic testing:  No pathogenic variants detected on the [employer] CustomNext-Cancer + RNAinsight panel. The report date is 02/28/2020.   The CustomNext-Cancer+RNAinsight panel offered by [facility] includes sequencing and rearrangement analysis for the following 47 genes:  APC, ATM, AXIN2, BARD1, BMPR1A, BRCA1, BRCA2, BRIP1, CDH1, CDK4, CDKN2A, CHEK2, DICER1, EPCAM, GREM1, HOXB13, MEN1, MLH1, MSH2, MSH3, MSH6, MUTYH, NBN, NF1, NF2, NTHL1, PALB2, PMS2, POLD1, POLE, PTEN, RAD51C, RAD51D, RECQL, RET, SDHA, SDHAF2, SDHB, SDHC, SDHD, SMAD4, SMARCA4, STK11, TP53, TSC1, TSC2, and VHL.  RNA data is routinely analyzed for use in variant interpretation for all genes.   06/24/2020 Surgery    Bilateral lumpectomies Barry Dienes):  Left breast: fibrocystic changes with usual ductal hyperplasia Right breast: no evidence of malignancy, with 2 right axillary lymph nodes negative for carcinoma   08/08/2020 -  Radiation Therapy   Adjuvant radiation therapy     CHIEF COMPLIANT: Follow-up of right breast cancer  INTERVAL HISTORY: Cathy Parrish is a 50 y.o. with above-mentioned history of right breast cancer who completed neoadjuvant chemotherapy and underwent bilateral lumpectomies, currently on radiation therapy. She reports to the clinic today for follow-up.   ALLERGIES:  is allergic to latex.  MEDICATIONS:  Current Outpatient Medications  Medication Sig Dispense Refill   acetaminophen (TYLENOL) 650 MG CR tablet Take 1,300 mg by mouth every 8 (eight) hours as needed for pain.     ALPRAZolam (XANAX) 0.5 MG tablet TAKE 1 TABLET(0.5 MG) BY MOUTH AT BEDTIME AS NEEDED FOR ANXIETY 30 tablet 3   anastrozole (ARIMIDEX) 1 MG tablet Take 1 tablet (1 mg total) by mouth daily. 90 tablet 3   atorvastatin (LIPITOR) 20 MG tablet Take 1 tablet (20 mg total) by mouth daily. 90 tablet 3   Cholecalciferol (VITAMIN D) 125 MCG (5000 UT) CAPS Take 5,000 Units by mouth daily. (Patient not taking: Reported on 07/23/2020)     dicyclomine (BENTYL) 10 MG capsule Take 1 capsule (10 mg total) by mouth 3 (three) times daily as needed for spasms. 30 capsule 0   gabapentin (NEURONTIN) 300 MG capsule Take 1 capsule (300 mg total) by mouth at bedtime. 90 capsule 3   ibuprofen (ADVIL) 800 MG tablet Take 800 mg by mouth  every 8 (eight) hours as needed for moderate pain.     irbesartan-hydrochlorothiazide (AVALIDE) 150-12.5 MG tablet Take 1 tablet by mouth daily. 90 tablet 2   loratadine (CLARITIN) 10 MG tablet Take 10 mg by mouth daily.     oxyCODONE (OXY IR/ROXICODONE) 5 MG immediate release tablet Take 1 tablet (5 mg total) by mouth every 6 (six) hours as needed for severe pain. 20 tablet 0   potassium chloride SA  (KLOR-CON) 20 MEQ tablet TAKE 1 TABLET(20 MEQ) BY MOUTH 1 TIME FOR 1 DOSE (Patient not taking: Reported on 07/23/2020) 14 tablet 0   prochlorperazine (COMPAZINE) 10 MG tablet TAKE 1 TABLET(10 MG) BY MOUTH EVERY 6 HOURS AS NEEDED FOR NAUSEA (Patient not taking: Reported on 07/23/2020) 30 tablet 1   QUEtiapine (SEROQUEL) 25 MG tablet TAKE 1 TO 2 TABLETS(25 TO 50 MG) BY MOUTH AT BEDTIME (Patient not taking: Reported on 07/23/2020) 90 tablet 3   SUMAtriptan (IMITREX) 50 MG tablet Take one tablet by mouth at the first sign of headache. May repeat in 2 hours if headache persists or recurs. (Patient taking differently: Take 50 mg by mouth every 2 (two) hours as needed. May repeat in 2 hours if headache persists or recurs.) 10 tablet 0   tetrahydrozoline 0.05 % ophthalmic solution Place 1 drop into both eyes daily as needed (dry/irritated eyes).     No current facility-administered medications for this visit.    PHYSICAL EXAMINATION: ECOG PERFORMANCE STATUS: 1 - Symptomatic but completely ambulatory  There were no vitals filed for this visit. There were no vitals filed for this visit.  BREAST: No palpable masses or nodules in either right or left breasts. No palpable axillary supraclavicular or infraclavicular adenopathy no breast tenderness or nipple discharge. (exam performed in the presence of a chaperone)  LABORATORY DATA:  I have reviewed the data as listed CMP Latest Ref Rng & Units 06/17/2020 05/01/2020 04/25/2020  Glucose 70 - 99 mg/dL 105(H) 122(H) 138(H)  BUN 6 - 20 mg/dL _0 Creatinine 0.44 - 1.00 mg/dL 0.66 0.69 0.66  Sodium 135 - 145 mmol/L 137 138 137  Potassium 3.5 - 5.1 mmol/L 3.3(L) 3.7 3.1(L)  Chloride 98 - 111 mmol/L 101 103 100  CO2 22 - 32 mmol/L _1 Calcium 8.9 - 10.3 mg/dL 9.3 8.4(L) 8.5(L)  Total Protein 6.5 - 8.1 g/dL - 6.5 6.6  Total Bilirubin 0.3 - 1.2 mg/dL - 0.6 0.5  Alkaline Phos 38 - 126 U/L - 78 92  AST 15 - 41 U/L - 14(L) 16  ALT 0 - 44 U/L - 18 19     Lab Results  Component Value Date   WBC 6.6 06/17/2020   HGB 11.7 (L) 06/17/2020   HCT 35.4 (L) 06/17/2020   MCV 95.4 06/17/2020   PLT 335 06/17/2020   NEUTROABS 1.5 (L) 05/01/2020    ASSESSMENT & PLAN:  Malignant neoplasm of upper-outer quadrant of right breast in female, estrogen receptor positive ([place]) 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%, right axillary lymph node biopsied benign but discordant T2N?M0 stage IIa versus stage IIIa (based on the lymph node)   Treatment plan: 1.  Neoadjuvant chemotherapy with dose dense Adriamycin and Cytoxan followed by Taxol (stopped after 7 cycles) and Carbo 2. Bilateral Lumpectomies: Complete response 2  SLN Neg 3.  Adjuvant radiation 08/08/2020-09/22/2020  4.  Followed by adjuvant antiestrogen therapy with or without abemaciclib -------------------------------------------------------------------------------------------------------------------------------------------------- Antiestrogen therapy counseling: Prior to all of this she had ablation and therefore never had cycles and therefore it is not clear whether she is truly in menopause.  Therefore we will obtain an estradiol and FSH levels today.  I will call her August 2 for a telephone visit to discuss results and to initiate the appropriate antiestrogen treatment plan. Patient has an appointment with survivorship care plan visit.    No orders of the defined types were placed in this encounter.  The patient has a good understanding of the overall plan. she agrees with it. she will call with any problems that may develop before the next visit here.  Total time spent: 30 mins including face to face time and time spent for planning, charting and coordination of care  Rulon Eisenmenger, MD,  MPH 09/09/2020  I, Thana Ates, am acting as scribe for Dr. Nicholas Lose.  I have reviewed the above documentation for accuracy and completeness, and I agree with the above.

## 2020-09-09 ENCOUNTER — Inpatient Hospital Stay: Payer: BC Managed Care – PPO | Attending: Hematology and Oncology | Admitting: Hematology and Oncology

## 2020-09-09 ENCOUNTER — Inpatient Hospital Stay: Payer: BC Managed Care – PPO

## 2020-09-09 ENCOUNTER — Ambulatory Visit
Admission: RE | Admit: 2020-09-09 | Discharge: 2020-09-09 | Disposition: A | Discharge status: Home / Self Care | Payer: BC Managed Care – PPO | Source: Ambulatory Visit | Attending: Radiation Oncology | Admitting: Radiation Oncology

## 2020-09-09 DIAGNOSIS — Z79811 Long term (current) use of aromatase inhibitors: Secondary | ICD-10-CM | POA: Diagnosis not present

## 2020-09-09 DIAGNOSIS — C50411 Malignant neoplasm of upper-outer quadrant of right female breast: Secondary | ICD-10-CM

## 2020-09-09 DIAGNOSIS — Z170 Estrogen receptor positive status [ER+]: Secondary | ICD-10-CM | POA: Insufficient documentation

## 2020-09-09 DIAGNOSIS — Z9221 Personal history of antineoplastic chemotherapy: Secondary | ICD-10-CM | POA: Diagnosis not present

## 2020-09-09 DIAGNOSIS — Z79899 Other long term (current) drug therapy: Secondary | ICD-10-CM | POA: Insufficient documentation

## 2020-09-09 DIAGNOSIS — Z923 Personal history of irradiation: Secondary | ICD-10-CM | POA: Diagnosis not present

## 2020-09-09 LAB — CBC WITH DIFFERENTIAL (CANCER CENTER ONLY)
Abs Immature Granulocytes: 0.01 10*3/uL (ref 0.00–0.07)
Basophils Absolute: 0 10*3/uL (ref 0.0–0.1)
Basophils Relative: 0 %
Eosinophils Absolute: 0.3 10*3/uL (ref 0.0–0.5)
Eosinophils Relative: 5 %
HCT: 35.4 % — ABNORMAL LOW (ref 36.0–46.0)
Hemoglobin: 11.8 g/dL — ABNORMAL LOW (ref 12.0–15.0)
Immature Granulocytes: 0 %
Lymphocytes Relative: 25 %
Lymphs Abs: 1.2 10*3/uL (ref 0.7–4.0)
MCH: 29.1 pg (ref 26.0–34.0)
MCHC: 33.3 g/dL (ref 30.0–36.0)
MCV: 87.2 fL (ref 80.0–100.0)
Monocytes Absolute: 0.8 10*3/uL (ref 0.1–1.0)
Monocytes Relative: 16 %
Neutro Abs: 2.7 10*3/uL (ref 1.7–7.7)
Neutrophils Relative %: 54 %
Platelet Count: 243 10*3/uL (ref 150–400)
RBC: 4.06 MIL/uL (ref 3.87–5.11)
RDW: 15.9 % — ABNORMAL HIGH (ref 11.5–15.5)
WBC Count: 4.9 10*3/uL (ref 4.0–10.5)
nRBC: 0 % (ref 0.0–0.2)

## 2020-09-09 LAB — CMP (CANCER CENTER ONLY)
ALT: 16 U/L (ref 0–44)
AST: 17 U/L (ref 15–41)
Albumin: 3.6 g/dL (ref 3.5–5.0)
Alkaline Phosphatase: 115 U/L (ref 38–126)
Anion gap: 11 (ref 5–15)
BUN: 17 mg/dL (ref 6–20)
CO2: 27 mmol/L (ref 22–32)
Calcium: 9.5 mg/dL (ref 8.9–10.3)
Chloride: 102 mmol/L (ref 98–111)
Creatinine: 0.76 mg/dL (ref 0.44–1.00)
GFR, Estimated: >60 mL/min (ref >60)
Glucose, Bld: 109 mg/dL — ABNORMAL HIGH (ref 70–99)
Potassium: 3.5 mmol/L (ref 3.5–5.1)
Sodium: 140 mmol/L (ref 135–145)
Total Bilirubin: 0.4 mg/dL (ref 0.3–1.2)
Total Protein: 7.4 g/dL (ref 6.5–8.1)

## 2020-09-09 NOTE — Assessment & Plan Note (Signed)
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%, HER-2 negative 12 o'clock position: 1.9 cm: Biopsy: Grade 3 IDC ER 40%(weakly positive), PR 0%, HER-2 negative, Ki-67 40%, right axillary lymph node biopsied benign but discordant T2N?M0 stage IIa versus stage IIIa (based on the lymph node)  Treatment plan: 1.Neoadjuvant chemotherapy with dose dense Adriamycin and Cytoxan followed by Taxol(stopped after 7 cycles) and Carbo 2.Bilateral Lumpectomies: Complete response 2 SLN Neg 3.Adjuvant radiation 08/08/2020-09/22/2020 4.Followed by adjuvant antiestrogen therapy with abemaciclib -------------------------------------------------------------------------------------------------------------------------------------------------- Counseled her about antiestrogen therapy once again. Patient has an appointment with survivorship care plan visit.

## 2020-09-10 ENCOUNTER — Ambulatory Visit: Payer: BC Managed Care – PPO

## 2020-09-10 DIAGNOSIS — C50411 Malignant neoplasm of upper-outer quadrant of right female breast: Secondary | ICD-10-CM | POA: Diagnosis not present

## 2020-09-10 DIAGNOSIS — Z170 Estrogen receptor positive status [ER+]: Secondary | ICD-10-CM | POA: Diagnosis not present

## 2020-09-10 LAB — FOLLICLE STIMULATING HORMONE: FSH: 41.3 m[IU]/mL

## 2020-09-11 ENCOUNTER — Telehealth: Payer: Self-pay | Admitting: Hematology and Oncology

## 2020-09-11 ENCOUNTER — Ambulatory Visit
Admission: RE | Admit: 2020-09-11 | Discharge: 2020-09-11 | Disposition: A | Discharge status: Home / Self Care | Payer: BC Managed Care – PPO | Source: Ambulatory Visit | Attending: Radiation Oncology | Admitting: Radiation Oncology

## 2020-09-11 DIAGNOSIS — C50411 Malignant neoplasm of upper-outer quadrant of right female breast: Secondary | ICD-10-CM | POA: Diagnosis not present

## 2020-09-11 DIAGNOSIS — Z170 Estrogen receptor positive status [ER+]: Secondary | ICD-10-CM | POA: Diagnosis not present

## 2020-09-11 NOTE — Telephone Encounter (Signed)
Scheduled per 7/12 los. Called and spoke with pt confirmed 8/2 appt

## 2020-09-12 ENCOUNTER — Ambulatory Visit: Payer: BC Managed Care – PPO

## 2020-09-12 ENCOUNTER — Ambulatory Visit: Payer: BC Managed Care – PPO | Admitting: Radiation Oncology

## 2020-09-12 ENCOUNTER — Encounter: Payer: Self-pay | Admitting: Hematology and Oncology

## 2020-09-12 ENCOUNTER — Ambulatory Visit
Admission: RE | Admit: 2020-09-12 | Discharge: 2020-09-12 | Disposition: A | Discharge status: Home / Self Care | Payer: BC Managed Care – PPO | Source: Ambulatory Visit | Attending: Radiation Oncology | Admitting: Radiation Oncology

## 2020-09-12 DIAGNOSIS — Z170 Estrogen receptor positive status [ER+]: Secondary | ICD-10-CM | POA: Diagnosis not present

## 2020-09-12 DIAGNOSIS — C50411 Malignant neoplasm of upper-outer quadrant of right female breast: Secondary | ICD-10-CM | POA: Diagnosis not present

## 2020-09-12 NOTE — Progress Notes (Signed)
  Radiation Oncology         ([phone]) [phone] ________________________________  Name: Letisia Schwalb MRN: [id number]  Date: 09/05/2020  DOB: 07-Dec-1970  SIMULATION NOTE   NARRATIVE:  The patient underwent simulation today for ongoing radiation therapy.  The existing CT study set was employed for the purpose of virtual treatment planning.  The target and avoidance structures were reviewed and modified as necessary.  Treatment planning then occurred.  The radiation boost prescription was entered and confirmed.  A total of 4 complex treatment devices were fabricated in the form of multi-leaf collimators to shape radiation around the targets while maximally excluding nearby normal structures. I have requested : Isodose Plan.    PLAN:  This modified radiation beam arrangement is intended to continue the current radiation dose to an additional 10 Gy in 5 fractions for a total cumulative dose of 60.4 Gy.    ------------------------------------------------  Jodelle Gross, MD, PhD

## 2020-09-15 ENCOUNTER — Ambulatory Visit
Admission: RE | Admit: 2020-09-15 | Discharge: 2020-09-15 | Disposition: A | Discharge status: Home / Self Care | Payer: BC Managed Care – PPO | Source: Ambulatory Visit | Attending: Radiation Oncology | Admitting: Radiation Oncology

## 2020-09-15 ENCOUNTER — Ambulatory Visit: Payer: BC Managed Care – PPO

## 2020-09-15 ENCOUNTER — Other Ambulatory Visit: Payer: Self-pay

## 2020-09-15 DIAGNOSIS — Z170 Estrogen receptor positive status [ER+]: Secondary | ICD-10-CM | POA: Diagnosis not present

## 2020-09-15 DIAGNOSIS — C50411 Malignant neoplasm of upper-outer quadrant of right female breast: Secondary | ICD-10-CM | POA: Diagnosis not present

## 2020-09-15 LAB — ESTRADIOL, ULTRA SENS: Estradiol, Sensitive: 20.2 pg/mL

## 2020-09-16 ENCOUNTER — Ambulatory Visit: Payer: BC Managed Care – PPO

## 2020-09-16 ENCOUNTER — Ambulatory Visit
Admission: RE | Admit: 2020-09-16 | Discharge: 2020-09-16 | Disposition: A | Discharge status: Home / Self Care | Payer: BC Managed Care – PPO | Source: Ambulatory Visit | Attending: Radiation Oncology | Admitting: Radiation Oncology

## 2020-09-16 DIAGNOSIS — C50411 Malignant neoplasm of upper-outer quadrant of right female breast: Secondary | ICD-10-CM | POA: Diagnosis not present

## 2020-09-16 DIAGNOSIS — Z170 Estrogen receptor positive status [ER+]: Secondary | ICD-10-CM | POA: Diagnosis not present

## 2020-09-17 ENCOUNTER — Ambulatory Visit: Payer: BC Managed Care – PPO

## 2020-09-17 ENCOUNTER — Encounter: Payer: Self-pay | Admitting: *Deleted

## 2020-09-17 ENCOUNTER — Ambulatory Visit
Admission: RE | Admit: 2020-09-17 | Discharge: 2020-09-17 | Disposition: A | Discharge status: Home / Self Care | Payer: BC Managed Care – PPO | Source: Ambulatory Visit | Attending: Radiation Oncology | Admitting: Radiation Oncology

## 2020-09-17 DIAGNOSIS — Z170 Estrogen receptor positive status [ER+]: Secondary | ICD-10-CM | POA: Diagnosis not present

## 2020-09-17 DIAGNOSIS — C50411 Malignant neoplasm of upper-outer quadrant of right female breast: Secondary | ICD-10-CM | POA: Diagnosis not present

## 2020-09-18 ENCOUNTER — Other Ambulatory Visit: Payer: Self-pay | Admitting: *Deleted

## 2020-09-18 ENCOUNTER — Ambulatory Visit
Admission: RE | Admit: 2020-09-18 | Discharge: 2020-09-18 | Disposition: A | Discharge status: Home / Self Care | Payer: BC Managed Care – PPO | Source: Ambulatory Visit | Attending: Radiation Oncology | Admitting: Radiation Oncology

## 2020-09-18 ENCOUNTER — Ambulatory Visit: Payer: BC Managed Care – PPO

## 2020-09-18 DIAGNOSIS — C50411 Malignant neoplasm of upper-outer quadrant of right female breast: Secondary | ICD-10-CM

## 2020-09-18 DIAGNOSIS — Z170 Estrogen receptor positive status [ER+]: Secondary | ICD-10-CM | POA: Diagnosis not present

## 2020-09-19 ENCOUNTER — Other Ambulatory Visit: Payer: Self-pay

## 2020-09-19 ENCOUNTER — Ambulatory Visit
Admission: RE | Admit: 2020-09-19 | Discharge: 2020-09-19 | Disposition: A | Discharge status: Home / Self Care | Payer: BC Managed Care – PPO | Source: Ambulatory Visit | Attending: Radiation Oncology | Admitting: Radiation Oncology

## 2020-09-19 ENCOUNTER — Ambulatory Visit: Payer: BC Managed Care – PPO

## 2020-09-19 DIAGNOSIS — C50411 Malignant neoplasm of upper-outer quadrant of right female breast: Secondary | ICD-10-CM | POA: Diagnosis not present

## 2020-09-19 DIAGNOSIS — Z170 Estrogen receptor positive status [ER+]: Secondary | ICD-10-CM | POA: Diagnosis not present

## 2020-09-22 ENCOUNTER — Other Ambulatory Visit: Payer: Self-pay

## 2020-09-22 ENCOUNTER — Ambulatory Visit: Payer: BC Managed Care – PPO

## 2020-09-22 ENCOUNTER — Ambulatory Visit
Admission: RE | Admit: 2020-09-22 | Discharge: 2020-09-22 | Disposition: A | Discharge status: Home / Self Care | Payer: BC Managed Care – PPO | Source: Ambulatory Visit | Attending: Radiation Oncology | Admitting: Radiation Oncology

## 2020-09-22 DIAGNOSIS — C50411 Malignant neoplasm of upper-outer quadrant of right female breast: Secondary | ICD-10-CM | POA: Diagnosis not present

## 2020-09-22 DIAGNOSIS — Z170 Estrogen receptor positive status [ER+]: Secondary | ICD-10-CM | POA: Diagnosis not present

## 2020-09-23 ENCOUNTER — Ambulatory Visit: Payer: BC Managed Care – PPO

## 2020-09-23 ENCOUNTER — Ambulatory Visit
Admission: RE | Admit: 2020-09-23 | Discharge: 2020-09-23 | Disposition: A | Discharge status: Home / Self Care | Payer: BC Managed Care – PPO | Source: Ambulatory Visit | Attending: Radiation Oncology | Admitting: Radiation Oncology

## 2020-09-23 ENCOUNTER — Encounter: Payer: Self-pay | Admitting: Radiation Oncology

## 2020-09-23 DIAGNOSIS — C50411 Malignant neoplasm of upper-outer quadrant of right female breast: Secondary | ICD-10-CM | POA: Diagnosis not present

## 2020-09-23 DIAGNOSIS — Z170 Estrogen receptor positive status [ER+]: Secondary | ICD-10-CM | POA: Diagnosis not present

## 2020-09-24 ENCOUNTER — Encounter: Payer: Self-pay | Admitting: Hematology and Oncology

## 2020-09-24 NOTE — Progress Notes (Signed)
                                                                                                                                                             Patient Name: Cathy Parrish MRN: [id number] DOB: 10/23/1970 Referring Physician: Delia Chimes (Profile Not Attached) Date of Service: 09/23/2020 [facility], [employer]                                                        End Of Treatment Note  Diagnoses: C50.411-Malignant neoplasm of upper-outer quadrant of right female breast  Cancer Staging: Stage IIA-IIIA, cT2N0-1M0 grade 3, ER positive invasive ductal carcinoma of the right breast.  Intent: Curative  Radiation Treatment Dates: 08/04/2020 through 09/23/2020 Site Technique Total Dose (Gy) Dose per Fx (Gy) Completed Fx Beam Energies  Breast, Right: Breast_Rt 3D 50.4/50.4 1.8 28/28 10X  Breast, Right: Breast_Rt_SCLV 3D 50.4/50.4 1.8 28/28 6X, 10X  Breast, Right: Breast_Rt_Bst 3D 10/10 2 5/5 6X, 10X   Narrative: The patient tolerated radiation therapy relatively well. She did develop desquamation along the right inframammary fold as well as fatigue.  Plan: The patient will receive a call in about one month from the radiation oncology department. She will continue follow up with Dr. Lindi Adie as well.   ________________________________________________    Carola Rhine, [facility]

## 2020-09-29 NOTE — Assessment & Plan Note (Signed)
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%, HER-2 negative 12 o'clock position: 1.9 cm: Biopsy: Grade 3 IDC ER 40%(weakly positive), PR 0%, HER-2 negative, Ki-67 40%, right axillary lymph node biopsied benign but discordant T2N?M0 stage IIa versus stage IIIa (based on the lymph node)  Treatment plan: 1.Neoadjuvant chemotherapy with dose dense Adriamycin and Cytoxan followed by Taxol(stopped after 7 cycles)and Carbo 2.Bilateral Lumpectomies: Complete response 2 SLN Neg 3.Adjuvant radiation 08/08/2020-09/22/2020  4.Followed by adjuvant antiestrogen therapy with or without abemaciclib -------------------------------------------------------------------------------------------------------------------------------------------------- Antiestrogen therapy counseling: Prior to all of this she had ablation and therefore never had cycles and therefore it is not clear whether she is truly in menopause.  estradiol and FSH levels 41.3, estradiol 20.2. (still perimenoausal)  I will call her August 2 for a telephone visit to discuss results and to initiate the appropriate antiestrogen treatment plan.

## 2020-09-29 NOTE — Progress Notes (Signed)
HEMATOLOGY-ONCOLOGY TELEPHONE VISIT PROGRESS NOTE  I connected with Cathy Parrish on 09/30/2020 at  2:45 PM EDT by telephone and verified that I am speaking with the correct person using two identifiers.  I discussed the limitations, risks, security and privacy concerns of performing an evaluation and management service by telephone and the availability of in person appointments.  I also discussed with the patient that there may be a patient responsible charge related to this service. The patient expressed understanding and agreed to proceed.   History of Present Illness: Cathy Parrish is a 50 y.o. female with above-mentioned history of right breast cancer who completed neoadjuvant chemotherapy, underwent bilateral lumpectomies, and radiation therapy. She reports to the clinic today for follow-up.  She is complaining of breast pain that is making it difficult for her to function.  Denies any discharge or fevers or chills.  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  Oncology History  Malignant neoplasm of upper-outer quadrant of right breast in female, estrogen receptor positive ([place])  12/13/2019 Initial Diagnosis   Screening detected right breast masses 11:30 position: 4.3 cm: Biopsy grade 3 IDC ER 90% PR 0%, KI 40%, HER-2 negative 12 o'clock position: 1.9 cm: Biopsy: Grade 3 IDC ER 40%, PR 0%, HER-2 negative, Ki-67 40%, right axillary lymph node biopsied benign but discordant   12/19/2019 Cancer Staging   Staging form: Breast, AJCC 8th Edition - Clinical stage from 12/19/2019: Stage IIIA (cT2, cN1, cM0, G3, ER+, PR-, HER2-) - Signed by Nicholas Lose, MD on 12/19/2019    01/03/2020 -  Chemotherapy    Patient is on Treatment Plan: BREAST DOSE DENSE AC Q14D / CARBOPLATIN D1 + PACLITAXEL D1,8,15 Q21D       02/28/2020 Genetic Testing   Negative genetic testing:  No pathogenic variants detected on the [employer] CustomNext-Cancer + RNAinsight panel.  The report date is 02/28/2020.   The CustomNext-Cancer+RNAinsight panel offered by [facility] includes sequencing and rearrangement analysis for the following 47 genes:  APC, ATM, AXIN2, BARD1, BMPR1A, BRCA1, BRCA2, BRIP1, CDH1, CDK4, CDKN2A, CHEK2, DICER1, EPCAM, GREM1, HOXB13, MEN1, MLH1, MSH2, MSH3, MSH6, MUTYH, NBN, NF1, NF2, NTHL1, PALB2, PMS2, POLD1, POLE, PTEN, RAD51C, RAD51D, RECQL, RET, SDHA, SDHAF2, SDHB, SDHC, SDHD, SMAD4, SMARCA4, STK11, TP53, TSC1, TSC2, and VHL.  RNA data is routinely analyzed for use in variant interpretation for all genes.   06/24/2020 Surgery   Bilateral lumpectomies Barry Dienes):  Left breast: fibrocystic changes with usual ductal hyperplasia Right breast: no evidence of malignancy, with 2 right axillary lymph nodes negative for carcinoma   08/08/2020 - 09/23/2020 Radiation Therapy   Adjuvant radiation therapy     Observations/Objective:     Assessment Plan:  Malignant neoplasm of upper-outer quadrant of right breast in female, estrogen receptor positive ([place]) 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%, right axillary lymph node biopsied benign but discordant T2N?M0 stage IIa versus stage IIIa (based on the lymph node)   Treatment plan: 1.  Neoadjuvant chemotherapy with dose dense Adriamycin and Cytoxan followed by Taxol (stopped after 7 cycles) and Carbo 2. Bilateral Lumpectomies: Complete response 2 SLN Neg 3.  Adjuvant radiation 08/08/2020-09/22/2020  4.  Followed by adjuvant antiestrogen therapy with or without abemaciclib -------------------------------------------------------------------------------------------------------------------------------------------------- Antiestrogen therapy counseling: Prior to all of this she had  ablation and therefore never had  cycles and therefore it is not clear whether she is truly in menopause.  estradiol and FSH levels 41.3, estradiol 20.2. (still perimenoausal) I recommended starting her on tamoxifen 20 mg daily. We can recheck her [place] and estradiol levels in a year and see if we can switch her to anastrozole therapy.  Severe breast pain: I sent another prescription for oxycodone.  I instructed her to taper this off and start alternating with Tylenol and Advil instead.  We do not intend to give her any further prescriptions for oxycodone in the future.  Return to clinic in a month with survivorship care plan visit.  I discussed the assessment and treatment plan with the patient. The patient was provided an opportunity to ask questions and all were answered. The patient agreed with the plan and demonstrated an understanding of the instructions. The patient was advised to call back or seek an in-person evaluation if the symptoms worsen or if the condition fails to improve as anticipated.   Total time spent: 15 mins including non-face to face time and time spent for planning, charting and coordination of care  Rulon Eisenmenger, MD 09/30/2020    I, Thana Ates, am acting as scribe for Nicholas Lose, MD.  I have reviewed the above documentation for accuracy and completeness, and I agree with the above.

## 2020-09-30 ENCOUNTER — Inpatient Hospital Stay: Payer: BC Managed Care – PPO | Attending: Hematology and Oncology | Admitting: Hematology and Oncology

## 2020-09-30 DIAGNOSIS — Z170 Estrogen receptor positive status [ER+]: Secondary | ICD-10-CM | POA: Diagnosis not present

## 2020-09-30 DIAGNOSIS — C50411 Malignant neoplasm of upper-outer quadrant of right female breast: Secondary | ICD-10-CM

## 2020-09-30 MED ORDER — TAMOXIFEN CITRATE 20 MG PO TABS
20.0000 mg | ORAL_TABLET | Freq: Every day | ORAL | 3 refills | Status: DC
Start: 2020-09-30 — End: 2021-04-30

## 2020-09-30 MED ORDER — OXYCODONE HCL 5 MG PO TABS: 5.0000 mg | ORAL_TABLET | Freq: Four times a day (QID) | ORAL | 0 refills | Status: DC | PRN

## 2020-10-07 ENCOUNTER — Ambulatory Visit: Payer: BC Managed Care – PPO

## 2020-10-10 ENCOUNTER — Telehealth: Payer: Self-pay | Admitting: Adult Health

## 2020-10-10 NOTE — Telephone Encounter (Signed)
R/s appt time per 8/12 sch msg. Pt aware.

## 2020-10-27 ENCOUNTER — Ambulatory Visit
Admission: RE | Admit: 2020-10-27 | Discharge: 2020-10-27 | Disposition: A | Discharge status: Home / Self Care | Payer: BC Managed Care – PPO | Source: Ambulatory Visit | Attending: Radiation Oncology | Admitting: Radiation Oncology

## 2020-10-27 DIAGNOSIS — C50411 Malignant neoplasm of upper-outer quadrant of right female breast: Secondary | ICD-10-CM | POA: Insufficient documentation

## 2020-10-27 DIAGNOSIS — Z170 Estrogen receptor positive status [ER+]: Secondary | ICD-10-CM

## 2020-10-27 NOTE — Progress Notes (Signed)
  Radiation Oncology         [phone] ________________________________  Name: Cathy Parrish MRN: [id number]  Date of Service: 10/27/2020  DOB: 04-17-1970  Post Treatment Telephone Note  Diagnosis:   Stage IIA-IIIA, cT2N0-1M0 grade 3, ER positive invasive ductal carcinoma of the right breast.  Interval Since Last Radiation:  5 weeks   08/04/2020 through 09/23/2020 Site Technique Total Dose (Gy) Dose per Fx (Gy) Completed Fx Beam Energies  Breast, Right: Breast_Rt 3D 50.4/50.4 1.8 28/28 10X  Breast, Right: Breast_Rt_SCLV 3D 50.4/50.4 1.8 28/28 6X, 10X  Breast, Right: Breast_Rt_Bst 3D 10/10 2 5/5 6X, 10X   Narrative:  The patient was contacted today for routine follow-up. During treatment she did very well with radiotherapy and did not have significant desquamation. She reports she is doing really well physically and that her skin has healed up and almost all of the discoloration has faded. She has been having hot flashes and feelings of depression, even crying at times since starting this medication. She has a good outlook on life and feels very positive about her course to date and expectations of survivorship in the future.  Impression/Plan: 1. Stage IIA-IIIA, cT2N0-1M0 grade 3, ER positive invasive ductal carcinoma of the right breast.. The patient has been doing well since completion of radiotherapy. We discussed that we would be happy to continue to follow her as needed, but she will also continue to follow up with Dr. Lindi Adie in medical oncology. She was counseled on skin care as well as measures to avoid sun exposure to this area. I encouraged her to discuss her side effects with Dr. Lindi Adie and I'll send him a message as well.  2. Survivorship. We discussed the importance of survivorship evaluation and encouraged her to attend her upcoming visit with that clinic.     Carola Rhine, PAC

## 2020-10-31 ENCOUNTER — Inpatient Hospital Stay: Payer: BC Managed Care – PPO | Attending: Hematology and Oncology | Admitting: Adult Health

## 2020-10-31 ENCOUNTER — Other Ambulatory Visit: Payer: Self-pay

## 2020-10-31 ENCOUNTER — Encounter: Payer: BC Managed Care – PPO | Admitting: Adult Health

## 2020-10-31 ENCOUNTER — Inpatient Hospital Stay: Payer: BC Managed Care – PPO

## 2020-10-31 ENCOUNTER — Ambulatory Visit (HOSPITAL_COMMUNITY)
Admission: RE | Admit: 2020-10-31 | Discharge: 2020-10-31 | Disposition: A | Discharge status: Home / Self Care | Payer: BC Managed Care – PPO | Source: Ambulatory Visit | Attending: Adult Health | Admitting: Adult Health

## 2020-10-31 ENCOUNTER — Encounter: Payer: Self-pay | Admitting: Adult Health

## 2020-10-31 VITALS — BP 129/73 | HR 86 | Temp 97.7°F | Resp 16 | Ht 66.0 in | Wt 263.7 lb

## 2020-10-31 DIAGNOSIS — C50411 Malignant neoplasm of upper-outer quadrant of right female breast: Secondary | ICD-10-CM

## 2020-10-31 DIAGNOSIS — I10 Essential (primary) hypertension: Secondary | ICD-10-CM | POA: Diagnosis not present

## 2020-10-31 DIAGNOSIS — Z1211 Encounter for screening for malignant neoplasm of colon: Secondary | ICD-10-CM

## 2020-10-31 DIAGNOSIS — Z170 Estrogen receptor positive status [ER+]: Secondary | ICD-10-CM | POA: Insufficient documentation

## 2020-10-31 DIAGNOSIS — Z8673 Personal history of transient ischemic attack (TIA), and cerebral infarction without residual deficits: Secondary | ICD-10-CM | POA: Diagnosis not present

## 2020-10-31 DIAGNOSIS — Z9221 Personal history of antineoplastic chemotherapy: Secondary | ICD-10-CM | POA: Diagnosis not present

## 2020-10-31 DIAGNOSIS — Z23 Encounter for immunization: Secondary | ICD-10-CM | POA: Insufficient documentation

## 2020-10-31 DIAGNOSIS — Z923 Personal history of irradiation: Secondary | ICD-10-CM | POA: Insufficient documentation

## 2020-10-31 DIAGNOSIS — Z79810 Long term (current) use of selective estrogen receptor modulators (SERMs): Secondary | ICD-10-CM | POA: Diagnosis not present

## 2020-10-31 DIAGNOSIS — T451X5A Adverse effect of antineoplastic and immunosuppressive drugs, initial encounter: Secondary | ICD-10-CM

## 2020-10-31 DIAGNOSIS — Z79899 Other long term (current) drug therapy: Secondary | ICD-10-CM | POA: Insufficient documentation

## 2020-10-31 DIAGNOSIS — R5383 Other fatigue: Secondary | ICD-10-CM | POA: Diagnosis not present

## 2020-10-31 DIAGNOSIS — M25511 Pain in right shoulder: Secondary | ICD-10-CM | POA: Diagnosis not present

## 2020-10-31 DIAGNOSIS — E1142 Type 2 diabetes mellitus with diabetic polyneuropathy: Secondary | ICD-10-CM | POA: Diagnosis not present

## 2020-10-31 DIAGNOSIS — F419 Anxiety disorder, unspecified: Secondary | ICD-10-CM | POA: Diagnosis not present

## 2020-10-31 DIAGNOSIS — Z7185 Encounter for immunization safety counseling: Secondary | ICD-10-CM

## 2020-10-31 DIAGNOSIS — G620 Drug-induced polyneuropathy: Secondary | ICD-10-CM | POA: Diagnosis not present

## 2020-10-31 DIAGNOSIS — N951 Menopausal and female climacteric states: Secondary | ICD-10-CM | POA: Diagnosis not present

## 2020-10-31 MED ORDER — PNEUMOCOCCAL 20-VAL CONJ VACC 0.5 ML IM SUSY
0.5000 mL | PREFILLED_SYRINGE | Freq: Once | INTRAMUSCULAR | Status: AC
  Administered 2020-10-31: 0.5 mL via INTRAMUSCULAR
  Filled 2020-10-31: qty 0.5

## 2020-10-31 NOTE — Progress Notes (Signed)
SURVIVORSHIP VISIT:     BRIEF ONCOLOGIC HISTORY:  Oncology History  Malignant neoplasm of upper-outer quadrant of right breast in female, estrogen receptor positive ([place])  12/13/2019 Initial Diagnosis   Screening detected right breast masses 11:30 position: 4.3 cm: Biopsy grade 3 IDC ER 90% PR 0%, KI 40%, HER-2 negative 12 o'clock position: 1.9 cm: Biopsy: Grade 3 IDC ER 40%, PR 0%, HER-2 negative, Ki-67 40%, right axillary lymph node biopsied benign but discordant   12/19/2019 Cancer Staging   Staging form: Breast, AJCC 8th Edition - Clinical stage from 12/19/2019: Stage IIIA (cT2, cN1, cM0, G3, ER+, PR-, HER2-) - Signed by Nicholas Lose, MD on 12/19/2019   01/03/2020 - 05/01/2020 Neo-Adjuvant Chemotherapy   Adriamycin and Cytoxan x 4 01/03/2020-02/14/2020 Taxol/Carbo x 6 02/29/2020-05/01/2020   02/28/2020 Genetic Testing   Negative genetic testing:  No pathogenic variants detected on the [facility] CustomNext-Cancer + RNAinsight panel. The report date is 02/28/2020.   The CustomNext-Cancer+RNAinsight panel offered by [facility] includes sequencing and rearrangement analysis for the following 47 genes:  APC, ATM, AXIN2, BARD1, BMPR1A, BRCA1, BRCA2, BRIP1, CDH1, CDK4, CDKN2A, CHEK2, DICER1, EPCAM, GREM1, HOXB13, MEN1, MLH1, MSH2, MSH3, MSH6, MUTYH, NBN, NF1, NF2, NTHL1, PALB2, PMS2, POLD1, POLE, PTEN, RAD51C, RAD51D, RECQL, RET, SDHA, SDHAF2, SDHB, SDHC, SDHD, SMAD4, SMARCA4, STK11, TP53, TSC1, TSC2, and VHL.  RNA data is routinely analyzed for use in variant interpretation for all genes.   06/24/2020 Surgery   Bilateral lumpectomies Barry Dienes):  Left breast: fibrocystic changes with usual ductal hyperplasia Right breast: no evidence of malignancy, with 2 right axillary lymph nodes negative for carcinoma   08/04/2020 - 09/23/2020 Radiation Therapy   Adjuvant radiation therapy 08/04/2020 through 09/23/2020 Site Technique Total Dose (Gy) Dose per Fx (Gy) Completed Fx Beam Energies  Breast, Right:  Breast_Rt 3D 50.4/50.4 1.8 28/28 10X  Breast, Right: Breast_Rt_SCLV 3D 50.4/50.4 1.8 28/28 6X, 10X  Breast, Right: Breast_Rt_Bst 3D 10/10 2 5/5 6X, 10X      09/2020 -  Anti-estrogen oral therapy   Tamoxifen daily     INTERVAL HISTORY:  Cathy Parrish to review her survivorship care plan detailing her treatment course for breast cancer, as well as monitoring long-term side effects of that treatment, education regarding health maintenance, screening, and overall wellness and health promotion.     Overall, Cathy Parrish reports feeling quite well.  She is taking Tamoxifen daily.  She notes that she is having some difficulty with the hot flashes.   They are occurring at night and contributing to fatigue.   She also has some residual peripheral neuropathy and is taking gabapentin for them.    Cathy Parrish notes some right shoulder pain.  She is working with PT and doing her exercises.  She notes the pain is present, and she has been faithful in doing her exercises.    Cathy Parrish also notes some anxiety and distress.  She is managing this with Xanax that she takes once a day about 4 days a week.    Cathy Parrish is overdue for pneumovax and colonoscopy.  She is willing to receive pneumovax today and referral for colonoscopy.    REVIEW OF SYSTEMS:  Review of Systems  Constitutional:  Positive for fatigue. Negative for appetite change, chills, fever and unexpected weight change.  HENT:   Negative for hearing loss, lump/mass and trouble swallowing.   Eyes:  Negative for eye problems and icterus.  Respiratory:  Negative for chest tightness, cough and shortness of breath.   Cardiovascular:  Negative for chest pain,  leg swelling and palpitations.  Gastrointestinal:  Negative for abdominal distention, abdominal pain, constipation, diarrhea, nausea and vomiting.  Endocrine: Positive for hot flashes.  Genitourinary:  Negative for difficulty urinating.   Musculoskeletal:  Negative for arthralgias.  Skin:  Negative for itching  and rash.  Neurological:  Positive for numbness. Negative for dizziness, extremity weakness and headaches.  Hematological:  Negative for adenopathy. Does not bruise/bleed easily.  Psychiatric/Behavioral:  Negative for depression. The patient is not nervous/anxious.   Breast: Denies any new nodularity, masses, tenderness, nipple changes, or nipple discharge.      ONCOLOGY TREATMENT TEAM:  1. Surgeon:  Dr. Barry Dienes at [facility] 2. Medical Oncologist: Dr. Lindi Adie  3. Radiation Oncologist: Dr. Lisbeth Renshaw    PAST MEDICAL/SURGICAL HISTORY:  Past Medical History:  Diagnosis Date   Anxiety    Arthritis    shoulders   Asthma    Breast cancer ([place]) 11/2019   Depression    Diabetes mellitus without complication ([place])    diet controlled   Family history of breast cancer    Family history of colon cancer    Family history of liver cancer    Headache    Hypertension    Stroke [facility])    TIA no deficits   Past Surgical History:  Procedure Laterality Date   AXILLARY SENTINEL NODE BIOPSY Right 06/24/2020   Procedure: RIGHT AXILLARY SENTINEL LYMPH NODE BIOPSY;  Surgeon: Stark Klein, MD;  Location: [place];  Service: General;  Laterality: Right;   BREAST BIOPSY Right    BREAST LUMPECTOMY WITH RADIOACTIVE SEED AND SENTINEL LYMPH NODE BIOPSY Right 06/24/2020   Procedure: RIGHT BREAST LUMPECTOMY WITH RADIOACTIVE SEED X 2;  Surgeon: Stark Klein, MD;  Location: [place];  Service: General;  Laterality: Right;   COLONOSCOPY  07/13/2011   Procedure: COLONOSCOPY;  Surgeon: Gatha Mayer, MD;  Location: [place];  Service: Endoscopy;  Laterality: N/A;   DILATION AND CURETTAGE OF UTERUS  abortion   DILITATION & CURRETTAGE/HYSTROSCOPY WITH NOVASURE ABLATION N/A 09/28/2012   Procedure: DILATATION & CURETTAGE/HYSTEROSCOPY WITH NOVASURE ABLATION; And Resectoscope;  Surgeon: Marvene Staff, MD;  Location: [facility];  Service: Gynecology;  Laterality: N/A;   ESOPHAGOGASTRODUODENOSCOPY  07/13/2011    Procedure: ESOPHAGOGASTRODUODENOSCOPY (EGD);  Surgeon: Gatha Mayer, MD;  Location: [facility] ENDOSCOPY;  Service: Endoscopy;  Laterality: N/A;   PORT-A-CATH REMOVAL Left 06/24/2020   Procedure: REMOVAL PORT-A-CATH;  Surgeon: Stark Klein, MD;  Location: [place];  Service: General;  Laterality: Left;   PORTACATH PLACEMENT N/A 01/02/2020   Procedure: INSERTION PORT-A-CATH;  Surgeon: Stark Klein, MD;  Location: [place];  Service: General;  Laterality: N/A;   RADIOACTIVE SEED GUIDED EXCISIONAL BREAST BIOPSY Left 06/24/2020   Procedure: RADIOACTIVE SEED GUIDED EXCISIONAL LEFT BREAST BIOPSY;  Surgeon: Stark Klein, MD;  Location: [place];  Service: General;  Laterality: Left;   SENTINEL NODE BIOPSY Right 06/24/2020   Procedure: RADIOACTIVE SEED LOCALIZED RIGHT LYMPH NODE BIOPSY;  Surgeon: Stark Klein, MD;  Location: [place];  Service: General;  Laterality: Right;   TONSILLECTOMY       ALLERGIES:  Allergies  Allergen Reactions   Latex Rash     CURRENT MEDICATIONS:  Outpatient Encounter Medications as of 10/31/2020  Medication Sig   acetaminophen (TYLENOL) 650 MG CR tablet Take 1,300 mg by mouth every 8 (eight) hours as needed for pain.   ALPRAZolam (XANAX) 0.5 MG tablet TAKE 1 TABLET(0.5 MG) BY MOUTH AT BEDTIME AS NEEDED FOR ANXIETY   atorvastatin (  LIPITOR) 20 MG tablet Take 1 tablet (20 mg total) by mouth daily.   Cholecalciferol (VITAMIN D) 125 MCG (5000 UT) CAPS Take 5,000 Units by mouth daily.   dicyclomine (BENTYL) 10 MG capsule Take 1 capsule (10 mg total) by mouth 3 (three) times daily as needed for spasms.   gabapentin (NEURONTIN) 300 MG capsule Take 1 capsule (300 mg total) by mouth at bedtime.   ibuprofen (ADVIL) 800 MG tablet Take 800 mg by mouth every 8 (eight) hours as needed for moderate pain.   irbesartan-hydrochlorothiazide (AVALIDE) 150-12.5 MG tablet Take 1 tablet by mouth daily.   loratadine (CLARITIN) 10 MG tablet Take 10 mg by mouth daily.   oxyCODONE (OXY  IR/ROXICODONE) 5 MG immediate release tablet Take 1 tablet (5 mg total) by mouth every 6 (six) hours as needed for severe pain.   SUMAtriptan (IMITREX) 50 MG tablet Take one tablet by mouth at the first sign of headache. May repeat in 2 hours if headache persists or recurs. (Patient taking differently: Take 50 mg by mouth every 2 (two) hours as needed. May repeat in 2 hours if headache persists or recurs.)   tamoxifen (NOLVADEX) 20 MG tablet Take 1 tablet (20 mg total) by mouth daily.   tetrahydrozoline 0.05 % ophthalmic solution Place 1 drop into both eyes daily as needed (dry/irritated eyes).   [DISCONTINUED] prochlorperazine (COMPAZINE) 10 MG tablet TAKE 1 TABLET(10 MG) BY MOUTH EVERY 6 HOURS AS NEEDED FOR NAUSEA (Patient not taking: Reported on 07/23/2020)   No facility-administered encounter medications on file as of 10/31/2020.     ONCOLOGIC FAMILY HISTORY:  Family History  Problem Relation Age of Onset   Breast cancer Half-Sister 64   Liver cancer Father        dx early 42s   Breast cancer Maternal Aunt        dx 70s   Colon cancer Maternal Aunt        dx 92s   Cancer Paternal Uncle        unknown type, dx >50     SOCIAL HISTORY:  Social History   Socioeconomic History   Marital status: Single    Spouse name: Not on file   Number of children: Not on file   Years of education: Not on file   Highest education level: Not on file  Occupational History   Not on file  Tobacco Use   Smoking status: Never   Smokeless tobacco: Never  Vaping Use   Vaping Use: Never used  Substance and Sexual Activity   Alcohol use: Not Currently   Drug use: No   Sexual activity: Yes    Birth control/protection: Condom  Other Topics Concern   Not on file  Social History Narrative   Not on file   Social Determinants of Health   Financial Resource Strain: Not on file  Food Insecurity: Not on file  Transportation Needs: Not on file  Physical Activity: Not on file  Stress: Not on file   Social Connections: Not on file  Intimate Partner Violence: Not on file     OBSERVATIONS/OBJECTIVE:  BP 129/73 (BP Location: Left Arm, Patient Position: Sitting)   Pulse 86   Temp 97.7 F (36.5 C) (Temporal)   Resp 16   Ht 5' 6"  (1.676 m)   Wt 263 lb 11.2 oz (119.6 kg)   SpO2 100%   BMI 42.56 kg/m  GENERAL: Patient is a well appearing female in no acute distress HEENT:  Sclerae anicteric.  Oropharynx clear and moist. No ulcerations or evidence of oropharyngeal candidiasis. Neck is supple.  NODES:  No cervical, supraclavicular, or axillary lymphadenopathy palpated.  BREAST EXAM:  s/p bilateral lumpectomies and right breast radiation, no sign of local recurrence LUNGS:  Clear to auscultation bilaterally.  No wheezes or rhonchi. HEART:  Regular rate and rhythm. No murmur appreciated. ABDOMEN:  Soft, nontender.  Positive, normoactive bowel sounds. No organomegaly palpated. MSK:  No focal spinal tenderness to palpation. Full range of motion bilaterally in the upper extremities. EXTREMITIES:  No peripheral edema.   SKIN:  Clear with no obvious rashes or skin changes. No nail dyscrasia. NEURO:  Nonfocal. Well oriented.  Appropriate affect.   LABORATORY DATA:  None for this visit.  DIAGNOSTIC IMAGING:  None for this visit.      ASSESSMENT AND PLAN:  Ms.. Cathy Parrish is a pleasant 50 y.o. 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  She presents to the Survivorship Clinic for our initial meeting and routine follow-up post-completion of treatment for breast cancer.    1. Stage IIIA right breast cancer:  Ms. Cathy Parrish is continuing to recover from definitive treatment for breast cancer. She will follow-up with her medical oncologist, Dr. Lindi Adie in 6 months with history and physical exam per  surveillance protocol.  She will continue her anti-estrogen therapy with Tamoxifen. Thus far, she is tolerating the tamoxifen moderately well, with minimal side effects (see #2). She was instructed to make Dr. Lindi Adie or myself aware if she begins to experience any worsening side effects of the medication and I could see her back in clinic to help manage those side effects, as needed. I also ordered her additional labs to evaluate her menopausal status in 6 months at her f/u with Dr. Lindi Adie.  Her mammogram is due 03/2021; orders placed today.   Today, a comprehensive survivorship care plan and treatment summary was reviewed with the patient today detailing her breast cancer diagnosis, treatment course, potential late/long-term effects of treatment, appropriate follow-up care with recommendations for the future, and patient education resources.  A copy of this summary, along with a letter will be sent to the patient's primary care provider via mail/fax/In Basket message after today's visit.    2. Hot flashes: These are related to her Tamoxifen.  She has only been taking this for a few weeks and I reviewed with her that it will improve with time.  We discussed taking a holiday from the medication for a week or two and starting back at half doses for a week then up to 66m a day.  She plans on staying on full dose and pushing through the hot flashes in hopes that they will improve.    3. Chemotherapy induced peripheral neuropathy: This is improving and she continues on Gabapentin for this.  We talked about possibly discontinuing this medication as her neuropathy improves.  I let her know we will see how her hot flashes go as well, since the Gabapentin often helps with these too.    4. Fatigue: This is multifactorial related to her previous treatments and sleep disturbance from the tamoxifen.  She understands this.  She is exercising which is great for tiredness.  This is also contributing to her mild cognitive  changes she noted in her PRO survey, which should also slowly improve with time.    5. Distress: She will continue  on treatment with Xanax PRN which she takes about four times a week.  This is appropriate use, however I reinforced that if she finds herself needing it more frequently, we may need to discuss a different medication such as SNRI with Effexor.    6. Bone health:   She was given education on specific activities to promote bone health.  7. Cancer screening:  Due to Ms. Cathy Parrish's history and her age, she should receive screening for skin cancers, colon cancer, and gynecologic cancers. I placed a referral to GI today for colon cancer screening. The information and recommendations are listed on the patient's comprehensive care plan/treatment summary and were reviewed in detail with the patient.    8. Health maintenance and wellness promotion: Ms. Cathy Parrish was encouraged to consume 5-7 servings of fruits and vegetables per day.  She was also encouraged to engage in moderate to vigorous exercise for 30 minutes per day most days of the week. We discussed the [employer] fitness program, which is designed for cancer survivors to help them become more physically fit after cancer treatments.  She was instructed to limit her alcohol consumption and continue to abstain from tobacco use.  I placed orders for her to receive Pneumovax today.   9. Support services/counseling: It is not uncommon for this period of the patient's cancer care trajectory to be one of many emotions and stressors.   She was given information regarding our available services and encouraged to contact me with any questions or for help enrolling in any of our support group/programs.   10. Shoulder pain: I placed orders for her to undergo xray today.     Follow up instructions:    -Return to cancer center in 6 months for f/u with Dr. Lindi Adie  -Mammogram due in 03/2021 -Referral for colonoscopy -Follow up with surgery 1 year -She  is welcome to return back to the Survivorship Clinic at any time; no additional follow-up needed at this time.  -Consider referral back to survivorship as a long-term survivor for continued surveillance  The patient was provided an opportunity to ask questions and all were answered. The patient agreed with the plan and demonstrated an understanding of the instructions.   Total encounter time: 60 minutes in face to face visit time, chart review, order entry, care coordination, and documentation of the encounter.    Wilber Bihari, NP 10/31/20 2:44 PM Medical Oncology and Hematology [facility] [place], [place] [address] Tel. [phone]    Fax. [phone]  *Total Encounter Time as defined by the Centers for Medicare and Medicaid Services includes, in addition to the face-to-face time of a patient visit (documented in the note above) non-face-to-face time: obtaining and reviewing outside history, ordering and reviewing medications, tests or procedures, care coordination (communications with other health care professionals or caregivers) and documentation in the medical record.

## 2020-11-02 DIAGNOSIS — T451X5A Adverse effect of antineoplastic and immunosuppressive drugs, initial encounter: Secondary | ICD-10-CM | POA: Insufficient documentation

## 2020-11-02 DIAGNOSIS — G620 Drug-induced polyneuropathy: Secondary | ICD-10-CM | POA: Insufficient documentation

## 2020-11-04 ENCOUNTER — Telehealth: Payer: Self-pay | Admitting: Hematology and Oncology

## 2020-11-04 NOTE — Telephone Encounter (Signed)
Scheduled per 9/2 los. Called and spoke with pt confirmed next appt

## 2020-11-05 ENCOUNTER — Telehealth: Payer: Self-pay | Admitting: *Deleted

## 2020-11-05 NOTE — Telephone Encounter (Signed)
Per request of Wilber Bihari, NP RN attempt x1 to contact pt regarding recent shoulder xray being negative for fracture.  No answer, LVM to return call to the office.

## 2020-11-25 ENCOUNTER — Encounter: Payer: Self-pay | Admitting: Internal Medicine

## 2020-11-25 DIAGNOSIS — G459 Transient cerebral ischemic attack, unspecified: Secondary | ICD-10-CM | POA: Diagnosis not present

## 2020-11-25 DIAGNOSIS — C50911 Malignant neoplasm of unspecified site of right female breast: Secondary | ICD-10-CM | POA: Diagnosis not present

## 2020-11-25 DIAGNOSIS — Z01419 Encounter for gynecological examination (general) (routine) without abnormal findings: Secondary | ICD-10-CM | POA: Diagnosis not present

## 2020-11-25 DIAGNOSIS — I10 Essential (primary) hypertension: Secondary | ICD-10-CM | POA: Diagnosis not present

## 2020-11-26 ENCOUNTER — Ambulatory Visit
Admission: RE | Admit: 2020-11-26 | Discharge: 2020-11-26 | Disposition: A | Discharge status: Home / Self Care | Payer: BC Managed Care – PPO | Source: Ambulatory Visit | Attending: Adult Health | Admitting: Adult Health

## 2020-11-26 ENCOUNTER — Other Ambulatory Visit: Payer: Self-pay

## 2020-11-26 DIAGNOSIS — R922 Inconclusive mammogram: Secondary | ICD-10-CM | POA: Diagnosis not present

## 2020-11-26 DIAGNOSIS — C50411 Malignant neoplasm of upper-outer quadrant of right female breast: Secondary | ICD-10-CM

## 2020-11-26 DIAGNOSIS — Z170 Estrogen receptor positive status [ER+]: Secondary | ICD-10-CM

## 2020-11-26 HISTORY — DX: Personal history of antineoplastic chemotherapy: Z92.21

## 2020-11-26 HISTORY — DX: Personal history of irradiation: Z92.3

## 2020-12-04 ENCOUNTER — Other Ambulatory Visit: Payer: Self-pay | Admitting: Hematology and Oncology

## 2020-12-07 ENCOUNTER — Other Ambulatory Visit: Payer: Self-pay | Admitting: Hematology and Oncology

## 2021-01-19 ENCOUNTER — Other Ambulatory Visit: Payer: Self-pay | Admitting: *Deleted

## 2021-01-19 DIAGNOSIS — C50411 Malignant neoplasm of upper-outer quadrant of right female breast: Secondary | ICD-10-CM

## 2021-01-19 NOTE — Progress Notes (Signed)
Patient Care Team: Forrest Moron, MD as PCP - General (Internal Medicine) Nicholas Lose, MD as Consulting Physician (Hematology and Oncology) Kyung Rudd, MD as Consulting Physician (Radiation Oncology) Stark Klein, MD as Consulting Physician (General Surgery) Servando Salina, MD as Consulting Physician (Obstetrics and Gynecology)  DIAGNOSIS:    ICD-10-CM   1. Malignant neoplasm of upper-outer quadrant of right breast in female, estrogen receptor positive ([place])  C50.411    Z17.0       SUMMARY OF ONCOLOGIC HISTORY: Oncology History  Malignant neoplasm of upper-outer quadrant of right breast in female, estrogen receptor positive ([place])  12/13/2019 Initial Diagnosis   Screening detected right breast masses 11:30 position: 4.3 cm: Biopsy grade 3 IDC ER 90% PR 0%, KI 40%, HER-2 negative 12 o'clock position: 1.9 cm: Biopsy: Grade 3 IDC ER 40%, PR 0%, HER-2 negative, Ki-67 40%, right axillary lymph node biopsied benign but discordant   12/19/2019 Cancer Staging   Staging form: Breast, AJCC 8th Edition - Clinical stage from 12/19/2019: Stage IIIA (cT2, cN1, cM0, G3, ER+, PR-, HER2-) - Signed by Nicholas Lose, MD on 12/19/2019    01/03/2020 - 05/01/2020 Neo-Adjuvant Chemotherapy   Adriamycin and Cytoxan x 4 01/03/2020-02/14/2020 Taxol/Carbo x 6 02/29/2020-05/01/2020   02/28/2020 Genetic Testing   Negative genetic testing:  No pathogenic variants detected on the [facility] CustomNext-Cancer + RNAinsight panel. The report date is 02/28/2020.   The CustomNext-Cancer+RNAinsight panel offered by [facility] includes sequencing and rearrangement analysis for the following 47 genes:  APC, ATM, AXIN2, BARD1, BMPR1A, BRCA1, BRCA2, BRIP1, CDH1, CDK4, CDKN2A, CHEK2, DICER1, EPCAM, GREM1, HOXB13, MEN1, MLH1, MSH2, MSH3, MSH6, MUTYH, NBN, NF1, NF2, NTHL1, PALB2, PMS2, POLD1, POLE, PTEN, RAD51C, RAD51D, RECQL, RET, SDHA, SDHAF2, SDHB, SDHC, SDHD, SMAD4, SMARCA4, STK11, TP53, TSC1, TSC2, and VHL.  RNA  data is routinely analyzed for use in variant interpretation for all genes.   06/24/2020 Surgery   Bilateral lumpectomies [facility] Dienes):  Left breast: fibrocystic changes with usual ductal hyperplasia Right breast: no evidence of malignancy, with 2 right axillary lymph nodes negative for carcinoma   08/04/2020 - 09/23/2020 Radiation Therapy   Adjuvant radiation therapy 08/04/2020 through 09/23/2020 Site Technique Total Dose (Gy) Dose per Fx (Gy) Completed Fx Beam Energies  Breast, Right: Breast_Rt 3D 50.4/50.4 1.8 28/28 10X  Breast, Right: Breast_Rt_SCLV 3D 50.4/50.4 1.8 28/28 6X, 10X  Breast, Right: Breast_Rt_Bst 3D 10/10 2 5/5 6X, 10X      09/2020 -  Anti-estrogen oral therapy   Tamoxifen daily     CHIEF COMPLIANT: Follow-up of right breast cancer  INTERVAL HISTORY: Cathy Parrish is a 50 y.o. with above-mentioned history of right breast cancer who completed neoadjuvant chemotherapy, underwent bilateral lumpectomies, and radiation therapy. Mammogram on 11/26/2020 showed no evidence of malignancy. She reports to the clinic today for follow-up.  For the past 2 weeks she has noticed leg swelling as well and has worsening fatigue.  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  She continues to report right-sided breast pain.  She does not feel well overall.  She still takes Bentyl for abdominal cramps.  ALLERGIES:  is allergic to latex.  MEDICATIONS:  Current Outpatient Medications  Medication Sig Dispense Refill   acetaminophen (TYLENOL) 650 MG CR tablet Take 1,300 mg by mouth every 8 (eight) hours as needed for pain.     ALPRAZolam (XANAX) 0.5 MG tablet TAKE 1 TABLET(0.5 MG) BY MOUTH AT BEDTIME AS NEEDED FOR ANXIETY 30 tablet 3   anastrozole (ARIMIDEX)  1 MG tablet Take 1 mg by mouth daily.     atorvastatin (LIPITOR) 20 MG tablet Take 1 tablet (20 mg total) by mouth daily. 90 tablet 3   Cholecalciferol (VITAMIN D) 125 MCG (5000 UT) CAPS Take 5,000  Units by mouth daily.     dicyclomine (BENTYL) 10 MG capsule TAKE 1 CAPSULE(10 MG) BY MOUTH THREE TIMES DAILY AS NEEDED FOR SPASMS 30 capsule 0   gabapentin (NEURONTIN) 300 MG capsule Take 1 capsule (300 mg total) by mouth at bedtime. 90 capsule 3   ibuprofen (ADVIL) 800 MG tablet Take 800 mg by mouth every 8 (eight) hours as needed for moderate pain.     irbesartan-hydrochlorothiazide (AVALIDE) 150-12.5 MG tablet Take 1 tablet by mouth daily. 90 tablet 2   loratadine (CLARITIN) 10 MG tablet Take 10 mg by mouth daily.     oxyCODONE (OXY IR/ROXICODONE) 5 MG immediate release tablet Take 1 tablet (5 mg total) by mouth every 6 (six) hours as needed for severe pain. 20 tablet 0   SUMAtriptan (IMITREX) 50 MG tablet Take one tablet by mouth at the first sign of headache. May repeat in 2 hours if headache persists or recurs. (Patient taking differently: Take 50 mg by mouth every 2 (two) hours as needed. May repeat in 2 hours if headache persists or recurs.) 10 tablet 0   tamoxifen (NOLVADEX) 20 MG tablet Take 1 tablet (20 mg total) by mouth daily. 90 tablet 3   tetrahydrozoline 0.05 % ophthalmic solution Place 1 drop into both eyes daily as needed (dry/irritated eyes).     No current facility-administered medications for this visit.    PHYSICAL EXAMINATION: ECOG PERFORMANCE STATUS: 1 - Symptomatic but completely ambulatory  Vitals:   01/20/21 0820  BP: 122/81  Pulse: 76  Resp: 17  Temp: 98.1 F (36.7 C)  SpO2: 99%   Filed Weights   01/20/21 0820  Weight: 264 lb 4.8 oz (119.9 kg)      LABORATORY DATA:  I have reviewed the data as listed CMP Latest Ref Rng & Units 09/09/2020 06/17/2020 05/01/2020  Glucose 70 - 99 mg/dL 109(H) 105(H) 122(H)  BUN 6 - 20 mg/dL _0 Creatinine 0.44 - 1.00 mg/dL 0.76 0.66 0.69  Sodium 135 - 145 mmol/L 140 137 138  Potassium 3.5 - 5.1 mmol/L 3.5 3.3(L) 3.7  Chloride 98 - 111 mmol/L 102 101 103  CO2 22 - 32 mmol/L _1 Calcium 8.9 - 10.3 mg/dL 9.5 9.3  8.4(L)  Total Protein 6.5 - 8.1 g/dL 7.4 - 6.5  Total Bilirubin 0.3 - 1.2 mg/dL 0.4 - 0.6  Alkaline Phos 38 - 126 U/L 115 - 78  AST 15 - 41 U/L 17 - 14(L)  ALT 0 - 44 U/L 16 - 18    Lab Results  Component Value Date   WBC 5.4 01/20/2021   HGB 12.5 01/20/2021   HCT 36.8 01/20/2021   MCV 85.8 01/20/2021   PLT 237 01/20/2021   NEUTROABS 2.2 01/20/2021    ASSESSMENT & PLAN:  Malignant neoplasm of upper-outer quadrant of right breast in female, estrogen receptor positive ([place]) 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%, right axillary lymph node biopsied benign but discordant T2N?M0 stage IIa versus stage IIIa (based on the lymph node)   Treatment  plan: 1.  Neoadjuvant chemotherapy with dose dense Adriamycin and Cytoxan followed by Taxol (stopped after 7 cycles) and Carbo 2. Bilateral Lumpectomies: Complete response 2 SLN Neg 3.  Adjuvant radiation 08/08/2020-09/22/2020  4.  Followed by adjuvant antiestrogen therapy with tamoxifen  -------------------------------------------------------------------------------------------------------------------------------------------------- estradiol and FSH levels 41.3, estradiol 20.2. (still perimenoausal) I recommended starting her on tamoxifen 20 mg daily. We can recheck her [place] and estradiol levels in a year and see if we can switch her to anastrozole therapy.  Tamoxifen toxicities: 1.  Hot flashes: Extremely severe, recommended holding tamoxifen for 2 weeks and then resuming at half a tablet daily 2. Fatigue: Labs have been performed, CBC looks normal.  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  If there is no blood clot then we will send a prescription for Lasix. Return to clinic 1 month for follow-up    No orders of the defined types were placed in this encounter.  The patient has a good understanding of the overall plan. she agrees with it. she will call with any problems that may develop before the next visit here.  Total time spent: 30 mins including face to face time and time spent for planning, charting and coordination of care  Rulon Eisenmenger, MD, MPH 01/20/2021  I, Thana Ates, am acting as scribe for Dr. Nicholas Lose.  I have reviewed the above documentation for accuracy and completeness, and I agree with the above.

## 2021-01-19 NOTE — Progress Notes (Signed)
Received call from pt very emotional and crying with complaint of bilateral lower extremity swelling as well as right breast pain x several weeks.  Pt requesting lab work to be drawn and to f/u with MD.  Appts scheduled and pt verbalized understanding of date and time.

## 2021-01-20 ENCOUNTER — Other Ambulatory Visit: Payer: Self-pay | Admitting: Hematology and Oncology

## 2021-01-20 ENCOUNTER — Other Ambulatory Visit: Payer: Self-pay

## 2021-01-20 ENCOUNTER — Telehealth: Payer: Self-pay | Admitting: Hematology and Oncology

## 2021-01-20 ENCOUNTER — Other Ambulatory Visit: Payer: Self-pay | Admitting: *Deleted

## 2021-01-20 ENCOUNTER — Ambulatory Visit (HOSPITAL_COMMUNITY)
Admission: RE | Admit: 2021-01-20 | Discharge: 2021-01-20 | Disposition: A | Discharge status: Home / Self Care | Payer: BC Managed Care – PPO | Source: Ambulatory Visit | Attending: Hematology and Oncology | Admitting: Hematology and Oncology

## 2021-01-20 ENCOUNTER — Inpatient Hospital Stay: Payer: BC Managed Care – PPO | Admitting: Hematology and Oncology

## 2021-01-20 ENCOUNTER — Inpatient Hospital Stay: Payer: BC Managed Care – PPO | Attending: Hematology and Oncology

## 2021-01-20 DIAGNOSIS — C50411 Malignant neoplasm of upper-outer quadrant of right female breast: Secondary | ICD-10-CM | POA: Insufficient documentation

## 2021-01-20 DIAGNOSIS — Z170 Estrogen receptor positive status [ER+]: Secondary | ICD-10-CM | POA: Insufficient documentation

## 2021-01-20 LAB — CMP (CANCER CENTER ONLY)
ALT: 13 U/L (ref 0–44)
AST: 13 U/L — ABNORMAL LOW (ref 15–41)
Albumin: 3.5 g/dL (ref 3.5–5.0)
Alkaline Phosphatase: 70 U/L (ref 38–126)
Anion gap: 9 (ref 5–15)
BUN: 17 mg/dL (ref 6–20)
CO2: 26 mmol/L (ref 22–32)
Calcium: 8.8 mg/dL — ABNORMAL LOW (ref 8.9–10.3)
Chloride: 106 mmol/L (ref 98–111)
Creatinine: 0.71 mg/dL (ref 0.44–1.00)
GFR, Estimated: >60 mL/min (ref >60)
Glucose, Bld: 126 mg/dL — ABNORMAL HIGH (ref 70–99)
Potassium: 3.8 mmol/L (ref 3.5–5.1)
Sodium: 141 mmol/L (ref 135–145)
Total Bilirubin: 0.4 mg/dL (ref 0.3–1.2)
Total Protein: 7 g/dL (ref 6.5–8.1)

## 2021-01-20 LAB — CBC WITH DIFFERENTIAL (CANCER CENTER ONLY)
Abs Immature Granulocytes: 0.01 10*3/uL (ref 0.00–0.07)
Basophils Absolute: 0 10*3/uL (ref 0.0–0.1)
Basophils Relative: 0 %
Eosinophils Absolute: 0.4 10*3/uL (ref 0.0–0.5)
Eosinophils Relative: 7 %
HCT: 36.8 % (ref 36.0–46.0)
Hemoglobin: 12.5 g/dL (ref 12.0–15.0)
Immature Granulocytes: 0 %
Lymphocytes Relative: 38 %
Lymphs Abs: 2.1 10*3/uL (ref 0.7–4.0)
MCH: 29.1 pg (ref 26.0–34.0)
MCHC: 34 g/dL (ref 30.0–36.0)
MCV: 85.8 fL (ref 80.0–100.0)
Monocytes Absolute: 0.7 10*3/uL (ref 0.1–1.0)
Monocytes Relative: 14 %
Neutro Abs: 2.2 10*3/uL (ref 1.7–7.7)
Neutrophils Relative %: 41 %
Platelet Count: 237 10*3/uL (ref 150–400)
RBC: 4.29 MIL/uL (ref 3.87–5.11)
RDW: 15 % (ref 11.5–15.5)
WBC Count: 5.4 10*3/uL (ref 4.0–10.5)
nRBC: 0 % (ref 0.0–0.2)

## 2021-01-20 LAB — MAGNESIUM: Magnesium: 1.4 mg/dL — ABNORMAL LOW (ref 1.7–2.4)

## 2021-01-20 MED ORDER — FUROSEMIDE 20 MG PO TABS: 20.0000 mg | ORAL_TABLET | Freq: Every day | ORAL | 0 refills | Status: DC

## 2021-01-20 NOTE — Telephone Encounter (Signed)
Scheduled appointment per 11/22 los. Patient is aware. 

## 2021-01-20 NOTE — Assessment & Plan Note (Signed)
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%, HER-2 negative 12 o'clock position: 1.9 cm: Biopsy: Grade 3 IDC ER 40%(weakly positive), PR 0%, HER-2 negative, Ki-67 40%, right axillary lymph node biopsied benign but discordant T2N?M0 stage IIa versus stage IIIa (based on the lymph node)  Treatment plan: 1.Neoadjuvant chemotherapy with dose dense Adriamycin and Cytoxan followed by Taxol(stopped after 7 cycles)and Carbo 2.Bilateral Lumpectomies: Complete response 2 SLN Neg 3.Adjuvant radiation6/11/2020-09/22/2020 4.Followed by adjuvant antiestrogen therapy with tamoxifen -------------------------------------------------------------------------------------------------------------------------------------------------- estradiol and FSH levels 41.3, estradiol 20.2. (still perimenoausal) I recommended starting her on tamoxifen 20 mg daily. We can recheck her [place] and estradiol levels in a year and see if we can switch her to anastrozole therapy.  Tamoxifen toxicities: 1.  Hot flashes 2. 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 Breast cancer surveillance: Mammogram 11/26/2020: Benign breast density category C  Return to clinic 1 year for follow-up

## 2021-01-20 NOTE — Progress Notes (Signed)
Per MD pt bilateral Vas [place] negative for DVT.  Verbal orders received for pt to be prescribed Lasix 20 mg p.o daily x30 days.  Prescription sent to pharmacy on file, pt educated and verbalized understanding.

## 2021-01-21 LAB — FOLLICLE STIMULATING HORMONE: FSH: 31.6 m[IU]/mL

## 2021-01-21 LAB — LUTEINIZING HORMONE: LH: 26.3 m[IU]/mL

## 2021-01-28 LAB — ESTRADIOL, ULTRA SENS: Estradiol, Sensitive: 13.7 pg/mL

## 2021-01-29 ENCOUNTER — Telehealth: Payer: Self-pay

## 2021-01-29 NOTE — Telephone Encounter (Signed)
Called pt and informed her that lab work shows post menopausal per [place].  This will be further discussed at her upcoming appointment in January.  Pt verbalized understanding.

## 2021-01-30 ENCOUNTER — Ambulatory Visit (AMBULATORY_SURGERY_CENTER): Payer: Self-pay | Admitting: *Deleted

## 2021-01-30 ENCOUNTER — Encounter: Payer: Self-pay | Admitting: Internal Medicine

## 2021-01-30 ENCOUNTER — Telehealth: Payer: Self-pay | Admitting: *Deleted

## 2021-01-30 ENCOUNTER — Other Ambulatory Visit: Payer: Self-pay

## 2021-01-30 VITALS — Ht 65.5 in | Wt 268.0 lb

## 2021-01-30 DIAGNOSIS — Z1211 Encounter for screening for malignant neoplasm of colon: Secondary | ICD-10-CM

## 2021-01-30 MED ORDER — NA SULFATE-K SULFATE-MG SULF 17.5-3.13-1.6 GM/177ML PO SOLN: 1.0000 | ORAL | 0 refills | Status: DC

## 2021-01-30 NOTE — Telephone Encounter (Signed)
Patient notified. She wants to proceed as scheduled.

## 2021-01-30 NOTE — Telephone Encounter (Signed)
I think she is close enough to 10 years to proceed if that is what she wants to do.  It is reasonable to defer until May 2023 if she wants.

## 2021-01-30 NOTE — Telephone Encounter (Signed)
Dr.Gessner,  This patient had colon 07/13/2011, normal recall 10 years. FH Aunt colon cancer 70's. She is having constipation but no other GI concerns. The patient was referred for screening colon by her oncologist. She has been treated for breast cancer in 11/2019. PV completed using 2 day prep.  Ok to proceed with [place] colon at this time?  Please advise. Thank you, Jaysha Lasure pv

## 2021-01-30 NOTE — Progress Notes (Signed)
Patient is here in-person for PV. Patient denies any allergies to eggs or soy. Patient denies any problems with anesthesia/sedation. Patient is not on any oxygen at home. Patient is not taking any diet/weight loss medications or blood thinners. Patient is aware of our care-partner policy and Covid-19 safety protocol.   EMMI education assigned to the patient for the procedure, sent to MyChart.   Patient is COVID-19 vaccinated.  

## 2021-02-13 ENCOUNTER — Ambulatory Visit (AMBULATORY_SURGERY_CENTER): Payer: BC Managed Care – PPO | Admitting: Internal Medicine

## 2021-02-13 ENCOUNTER — Encounter: Payer: Self-pay | Admitting: Internal Medicine

## 2021-02-13 ENCOUNTER — Other Ambulatory Visit: Payer: Self-pay

## 2021-02-13 VITALS — BP 123/86 | HR 72 | Temp 97.4°F | Resp 11 | Ht 65.5 in | Wt 268.0 lb

## 2021-02-13 DIAGNOSIS — Z1211 Encounter for screening for malignant neoplasm of colon: Secondary | ICD-10-CM

## 2021-02-13 MED ORDER — SODIUM CHLORIDE 0.9 % IV SOLN: 500.0000 mL | Freq: Once | INTRAVENOUS | Status: DC

## 2021-02-13 NOTE — Patient Instructions (Addendum)
No polyps or cancer were seen.  You do have diverticulosis - thickened muscle rings and pouches in the colon wall. Please read the handout about this condition.  Next routine colonoscopy or other screening test in 10 years - 2032.  I appreciate the opportunity to care for you. Gatha Mayer, MD, FACG  YOU HAD AN ENDOSCOPIC PROCEDURE TODAY: Refer to the procedure report and other information in the discharge instructions given to you for any specific questions about what was found during the examination. If this information does not answer your questions, please call [facility] office at [phone] to clarify.   YOU SHOULD EXPECT: Some feelings of bloating in the abdomen. Passage of more gas than usual. Walking can help get rid of the air that was put into your GI tract during the procedure and reduce the bloating. If you had a lower endoscopy (such as a colonoscopy or flexible sigmoidoscopy) you may notice spotting of blood in your stool or on the toilet paper. Some abdominal soreness may be present for a day or two, also.  DIET: Your first meal following the procedure should be a light meal and then it is ok to progress to your normal diet. A half-sandwich or bowl of soup is an example of a good first meal. Heavy or fried foods are harder to digest and may make you feel nauseous or bloated. Drink plenty of fluids but you should avoid alcoholic beverages for 24 hours. If you had a esophageal dilation, please see attached instructions for diet.    ACTIVITY: Your care partner should take you home directly after the procedure. You should plan to take it easy, moving slowly for the rest of the day. You can resume normal activity the day after the procedure however YOU SHOULD NOT DRIVE, use power tools, machinery or perform tasks that involve climbing or major physical exertion for 24 hours (because of the sedation medicines used during the test).   SYMPTOMS TO REPORT IMMEDIATELY: A gastroenterologist  can be reached at any hour. Please call [phone]  for any of the following symptoms:  Following lower endoscopy (colonoscopy, flexible sigmoidoscopy) Excessive amounts of blood in the stool  Significant tenderness, worsening of abdominal pains  Swelling of the abdomen that is new, acute  Fever of 100 or higher   stools  FOLLOW UP:  If any biopsies were taken you will be contacted by phone or by letter within the next 1-3 weeks. Call [phone]  if you have not heard about the biopsies in 3 weeks.  Please also call with any specific questions about appointments or follow up tests.

## 2021-02-13 NOTE — Progress Notes (Signed)
Report given to PACU, vss 

## 2021-02-13 NOTE — Op Note (Signed)
[facility] Patient Name: Cathy Parrish Procedure Date: 02/13/2021 7:31 AM MRN: [id number] Endoscopist: Gatha Mayer , MD Age: 50 Referring MD:  Date of Birth: 01-Oct-1970 Gender: Female Account #: [id number] Procedure:                Colonoscopy Indications:              Screening for colorectal malignant neoplasm Medicines:                Propofol per Anesthesia, Monitored Anesthesia Care Procedure:                Pre-Anesthesia Assessment:                           - Prior to the procedure, a History and Physical                            was performed, and patient medications and                            allergies were reviewed. The patient's tolerance of                            previous anesthesia was also reviewed. The risks                            and benefits of the procedure and the sedation                            options and risks were discussed with the patient.                            All questions were answered, and informed consent                            was obtained. Prior Anticoagulants: The patient has                            taken no previous anticoagulant or antiplatelet                            agents. ASA Grade Assessment: III - A patient with                            severe systemic disease. After reviewing the risks                            and benefits, the patient was deemed in                            satisfactory condition to undergo the procedure.                           After obtaining informed consent, the colonoscope  was passed under direct vision. Throughout the                            procedure, the patient's blood pressure, pulse, and                            oxygen saturations were monitored continuously. The                            Olympus CF-HQ190L [id number]) Colonoscope was                            introduced through the anus and advanced to the the                             cecum, identified by appendiceal orifice and                            ileocecal valve. The colonoscopy was performed                            without difficulty. The patient tolerated the                            procedure well. The quality of the bowel                            preparation was excellent. The ileocecal valve,                            appendiceal orifice, and rectum were photographed.                            The bowel preparation used was Miralax via split                            dose instruction. The bowel preparation used was                            SUPREP via split dose instruction. Scope In: 8:09:59 AM Scope Out: 8:25:51 AM Scope Withdrawal Time: 0 hours 13 minutes 4 seconds  Total Procedure Duration: 0 hours 15 minutes 52 seconds  Findings:                 The perianal and digital rectal examinations were                            normal.                           Scattered diverticula were found in the entire                            colon.  The exam was otherwise without abnormality on                            direct and retroflexion views. Complications:            No immediate complications. Estimated Blood Loss:     Estimated blood loss: none. Impression:               - Diverticulosis in the entire examined colon.                           - The examination was otherwise normal on direct                            and retroflexion views.                           - No specimens collected. Recommendation:           - Patient has a contact number available for                            emergencies. The signs and symptoms of potential                            delayed complications were discussed with the                            patient. Return to normal activities tomorrow.                            Written discharge instructions were provided to the                            patient.                            - Resume previous diet.                           - Continue present medications.                           - Repeat colonoscopy in 10 years for screening                            purposes. Gatha Mayer, MD 02/13/2021 8:30:29 AM This report has been signed electronically.

## 2021-02-13 NOTE — Progress Notes (Signed)
[facility] History and Physical   Primary Care Physician:  Forrest Moron, MD   Reason for Procedure:   Colon cancer screening  Plan:    colonoscopy     HPI: Cathy Parrish is a 50 y.o. female here for screening colonoscopy   Past Medical History:  Diagnosis Date   Anxiety    Arthritis    shoulders   Asthma    Breast cancer ([place]) 11/2019   Depression    Diabetes mellitus without complication ([place])    diet controlled   Family history of breast cancer    Family history of colon cancer    Family history of liver cancer    Headache    Hypertension    Personal history of chemotherapy    Personal history of radiation therapy    Stroke ([place])    TIA no deficits, 6 years ago per pt    Past Surgical History:  Procedure Laterality Date   AXILLARY SENTINEL NODE BIOPSY Right 06/24/2020   Procedure: RIGHT AXILLARY SENTINEL LYMPH NODE BIOPSY;  Surgeon: Stark Klein, MD;  Location: [place];  Service: General;  Laterality: Right;   BREAST BIOPSY Right    BREAST LUMPECTOMY WITH RADIOACTIVE SEED AND SENTINEL LYMPH NODE BIOPSY Right 06/24/2020   Procedure: RIGHT BREAST LUMPECTOMY WITH RADIOACTIVE SEED X 2;  Surgeon: Stark Klein, MD;  Location: [place];  Service: General;  Laterality: Right;   COLONOSCOPY  07/13/2011   Procedure: COLONOSCOPY;  Surgeon: Gatha Mayer, MD;  Location: [place];  Service: Endoscopy;  Laterality: N/A;   DILATION AND CURETTAGE OF UTERUS  abortion   DILITATION & CURRETTAGE/HYSTROSCOPY WITH NOVASURE ABLATION N/A 09/28/2012   Procedure: DILATATION & CURETTAGE/HYSTEROSCOPY WITH NOVASURE ABLATION; And Resectoscope;  Surgeon: Marvene Staff, MD;  Location: [facility];  Service: Gynecology;  Laterality: N/A;   ESOPHAGOGASTRODUODENOSCOPY  07/13/2011   Procedure: ESOPHAGOGASTRODUODENOSCOPY (EGD);  Surgeon: Gatha Mayer, MD;  Location: [facility] ENDOSCOPY;  Service: Endoscopy;  Laterality: N/A;   PORT-A-CATH REMOVAL Left 06/24/2020   Procedure: REMOVAL  PORT-A-CATH;  Surgeon: Stark Klein, MD;  Location: [place];  Service: General;  Laterality: Left;   PORTACATH PLACEMENT N/A 01/02/2020   Procedure: INSERTION PORT-A-CATH;  Surgeon: Stark Klein, MD;  Location: [place];  Service: General;  Laterality: N/A;   RADIOACTIVE SEED GUIDED EXCISIONAL BREAST BIOPSY Left 06/24/2020   Procedure: RADIOACTIVE SEED GUIDED EXCISIONAL LEFT BREAST BIOPSY;  Surgeon: Stark Klein, MD;  Location: [place];  Service: General;  Laterality: Left;   SENTINEL NODE BIOPSY Right 06/24/2020   Procedure: RADIOACTIVE SEED LOCALIZED RIGHT LYMPH NODE BIOPSY;  Surgeon: Stark Klein, MD;  Location: [place];  Service: General;  Laterality: Right;   TONSILLECTOMY      Prior to Admission medications   Medication Sig Start Date End Date Taking? Authorizing Provider  acetaminophen (TYLENOL) 650 MG CR tablet Take 1,300 mg by mouth every 8 (eight) hours as needed for pain.   Yes [provider]  ALPRAZolam (XANAX) 0.5 MG tablet TAKE 1 TABLET(0.5 MG) BY MOUTH AT BEDTIME AS NEEDED FOR ANXIETY 12/08/20  Yes Nicholas Lose, MD  atorvastatin (LIPITOR) 20 MG tablet Take 1 tablet (20 mg total) by mouth daily. 12/10/18  Yes Stallings, Arlie Solomons, MD  Cholecalciferol (VITAMIN D) 125 MCG (5000 UT) CAPS Take 5,000 Units by mouth daily.   Yes [provider]  furosemide (LASIX) 20 MG tablet Take 1 tablet (20 mg total) by mouth daily. 01/20/21  Yes Nicholas Lose, MD  gabapentin (NEURONTIN) 300 MG capsule Take 1 capsule (300 mg total) by mouth at bedtime. 08/20/20  Yes Nicholas Lose, MD  ibuprofen (ADVIL) 800 MG tablet Take 800 mg by mouth every 8 (eight) hours as needed for moderate pain. 03/20/20  Yes [provider]  irbesartan-hydrochlorothiazide (AVALIDE) 150-12.5 MG tablet Take 1 tablet by mouth daily. 07/20/19  Yes Forrest Moron, MD  tamoxifen (NOLVADEX) 20 MG tablet Take 1 tablet (20 mg total) by mouth daily. 09/30/20  Yes Nicholas Lose, MD  dicyclomine  (BENTYL) 10 MG capsule TAKE 1 CAPSULE(10 MG) BY MOUTH THREE TIMES DAILY AS NEEDED FOR SPASMS 12/04/20   Nicholas Lose, MD  loratadine (CLARITIN) 10 MG tablet Take 10 mg by mouth daily.    [provider]  SUMAtriptan (IMITREX) 50 MG tablet Take one tablet by mouth at the first sign of headache. May repeat in 2 hours if headache persists or recurs. Patient taking differently: Take 50 mg by mouth every 2 (two) hours as needed. May repeat in 2 hours if headache persists or recurs. 09/04/18   Forrest Moron, MD  tetrahydrozoline 0.05 % ophthalmic solution Place 1 drop into both eyes daily as needed (dry/irritated eyes).    [provider]  prochlorperazine (COMPAZINE) 10 MG tablet TAKE 1 TABLET(10 MG) BY MOUTH EVERY 6 HOURS AS NEEDED FOR NAUSEA Patient not taking: Reported on 07/23/2020 05/13/20 09/30/20  Nicholas Lose, MD    Current Outpatient Medications  Medication Sig Dispense Refill   acetaminophen (TYLENOL) 650 MG CR tablet Take 1,300 mg by mouth every 8 (eight) hours as needed for pain.     ALPRAZolam (XANAX) 0.5 MG tablet TAKE 1 TABLET(0.5 MG) BY MOUTH AT BEDTIME AS NEEDED FOR ANXIETY 30 tablet 3   atorvastatin (LIPITOR) 20 MG tablet Take 1 tablet (20 mg total) by mouth daily. 90 tablet 3   Cholecalciferol (VITAMIN D) 125 MCG (5000 UT) CAPS Take 5,000 Units by mouth daily.     furosemide (LASIX) 20 MG tablet Take 1 tablet (20 mg total) by mouth daily. 30 tablet 0   gabapentin (NEURONTIN) 300 MG capsule Take 1 capsule (300 mg total) by mouth at bedtime. 90 capsule 3   ibuprofen (ADVIL) 800 MG tablet Take 800 mg by mouth every 8 (eight) hours as needed for moderate pain.     irbesartan-hydrochlorothiazide (AVALIDE) 150-12.5 MG tablet Take 1 tablet by mouth daily. 90 tablet 2   tamoxifen (NOLVADEX) 20 MG tablet Take 1 tablet (20 mg total) by mouth daily. 90 tablet 3   dicyclomine (BENTYL) 10 MG capsule TAKE 1 CAPSULE(10 MG) BY MOUTH THREE TIMES DAILY AS NEEDED FOR SPASMS 30 capsule  0   loratadine (CLARITIN) 10 MG tablet Take 10 mg by mouth daily.     SUMAtriptan (IMITREX) 50 MG tablet Take one tablet by mouth at the first sign of headache. May repeat in 2 hours if headache persists or recurs. (Patient taking differently: Take 50 mg by mouth every 2 (two) hours as needed. May repeat in 2 hours if headache persists or recurs.) 10 tablet 0   tetrahydrozoline 0.05 % ophthalmic solution Place 1 drop into both eyes daily as needed (dry/irritated eyes).     Current Facility-Administered Medications  Medication Dose Route Frequency Provider Last Rate Last Admin   0.9 %  sodium chloride infusion  500 mL Intravenous Once Gatha Mayer, MD        Allergies as of 02/13/2021 - Review Complete 02/13/2021  Allergen Reaction Noted  Latex Rash 06/13/2020    Family History  Problem Relation Age of Onset   Liver cancer Father        dx early 5s   Breast cancer Maternal Aunt        dx 48s   Colon cancer Maternal Aunt        dx 63s   Cancer Paternal Uncle        unknown type, dx >50   Breast cancer Half-Sister 73   Esophageal cancer Neg Hx    Stomach cancer Neg Hx     Social History   Socioeconomic History   Marital status: Single    Spouse name: Not on file   Number of children: Not on file   Years of education: Not on file   Highest education level: Not on file  Occupational History   Not on file  Tobacco Use   Smoking status: Never   Smokeless tobacco: Never  Vaping Use   Vaping Use: Never used  Substance and Sexual Activity   Alcohol use: Not Currently   Drug use: No   Sexual activity: Yes    Birth control/protection: Condom  Other Topics Concern     Review of Systems:  other review of systems negative except as mentioned in the HPI.  Physical Exam: Vital signs BP 131/72    Pulse 80    Temp (!) 97.4 F (36.3 C)    Ht 5' 5.5" (1.664 m)    Wt 268 lb (121.6 kg)    SpO2 98%    BMI 43.92 kg/m   General:   Alert,  Well-developed, well-nourished,  pleasant and cooperative in NAD Lungs:  Clear throughout to auscultation.   Heart:  Regular rate and rhythm; no murmurs, clicks, rubs,  or gallops. Abdomen:  Soft, nontender and nondistended. Normal bowel sounds.   Neuro/Psych:  Alert and cooperative. Normal mood and affect. A and O x 3   @Coltin Casher  Simonne Maffucci, MD, [facility] [phone] (pager) 02/13/2021 8:04 AM@

## 2021-02-13 NOTE — Progress Notes (Signed)
Pt's states no medical or surgical changes since previsit or office visit.   VS taken by CW 

## 2021-02-17 ENCOUNTER — Telehealth: Payer: Self-pay

## 2021-02-17 ENCOUNTER — Other Ambulatory Visit: Payer: Self-pay | Admitting: Hematology and Oncology

## 2021-02-17 NOTE — Telephone Encounter (Signed)
°  Follow up Call-  Call back number 02/13/2021  Post procedure Call Back phone  # [phone]  Permission to leave phone message Yes  Some recent data might be hidden     Patient questions:  Do you have a fever, pain , or abdominal swelling? No. Pain Score  0 *  Have you tolerated food without any problems? Yes.    Have you been able to return to your normal activities? Yes.    Do you have any questions about your discharge instructions: Diet   No. Medications  No. Follow up visit  No.  Do you have questions or concerns about your Care? No.  Actions: * If pain score is 4 or above: No action needed, pain <4.  Have you developed a fever since your procedure? no  2.   Have you had an respiratory symptoms (SOB or cough) since your procedure? no  3.   Have you tested positive for COVID 19 since your procedure no  4.   Have you had any family members/close contacts diagnosed with the COVID 19 since your procedure?  no   If yes to any of these questions please route to Joylene John, RN and Joella Prince, RN

## 2021-02-17 NOTE — Telephone Encounter (Signed)
Left message on follow up call. 

## 2021-03-03 NOTE — Progress Notes (Signed)
Patient Care Team: Forrest Moron, MD as PCP - General (Internal Medicine) Nicholas Lose, MD as Consulting Physician (Hematology and Oncology) Kyung Rudd, MD as Consulting Physician (Radiation Oncology) Stark Klein, MD as Consulting Physician (General Surgery) Servando Salina, MD as Consulting Physician (Obstetrics and Gynecology)  DIAGNOSIS:    ICD-10-CM   1. Malignant neoplasm of upper-outer quadrant of right breast in female, estrogen receptor positive ([place])  C50.411    Z17.0       SUMMARY OF ONCOLOGIC HISTORY: Oncology History  Malignant neoplasm of upper-outer quadrant of right breast in female, estrogen receptor positive ([place])  12/13/2019 Initial Diagnosis   Screening detected right breast masses 11:30 position: 4.3 cm: Biopsy grade 3 IDC ER 90% PR 0%, KI 40%, HER-2 negative 12 o'clock position: 1.9 cm: Biopsy: Grade 3 IDC ER 40%, PR 0%, HER-2 negative, Ki-67 40%, right axillary lymph node biopsied benign but discordant   12/19/2019 Cancer Staging   Staging form: Breast, AJCC 8th Edition - Clinical stage from 12/19/2019: Stage IIIA (cT2, cN1, cM0, G3, ER+, PR-, HER2-) - Signed by Nicholas Lose, MD on 12/19/2019    01/03/2020 - 05/01/2020 Neo-Adjuvant Chemotherapy   Adriamycin and Cytoxan x 4 01/03/2020-02/14/2020 Taxol/Carbo x 6 02/29/2020-05/01/2020   02/28/2020 Genetic Testing   Negative genetic testing:  No pathogenic variants detected on the [employer] CustomNext-Cancer + RNAinsight panel. The report date is 02/28/2020.   The CustomNext-Cancer+RNAinsight panel offered by [facility] includes sequencing and rearrangement analysis for the following 47 genes:  APC, ATM, AXIN2, BARD1, BMPR1A, BRCA1, BRCA2, BRIP1, CDH1, CDK4, CDKN2A, CHEK2, DICER1, EPCAM, GREM1, HOXB13, MEN1, MLH1, MSH2, MSH3, MSH6, MUTYH, NBN, NF1, NF2, NTHL1, PALB2, PMS2, POLD1, POLE, PTEN, RAD51C, RAD51D, RECQL, RET, SDHA, SDHAF2, SDHB, SDHC, SDHD, SMAD4, SMARCA4, STK11, TP53, TSC1, TSC2, and VHL.  RNA  data is routinely analyzed for use in variant interpretation for all genes.   06/24/2020 Surgery   Bilateral lumpectomies [facility]):  Left breast: fibrocystic changes with usual ductal hyperplasia Right breast: no evidence of malignancy, with 2 right axillary lymph nodes negative for carcinoma   08/04/2020 - 09/23/2020 Radiation Therapy   Adjuvant radiation therapy 08/04/2020 through 09/23/2020 Site Technique Total Dose (Gy) Dose per Fx (Gy) Completed Fx Beam Energies  Breast, Right: Breast_Rt 3D 50.4/50.4 1.8 28/28 10X  Breast, Right: Breast_Rt_SCLV 3D 50.4/50.4 1.8 28/28 6X, 10X  Breast, Right: Breast_Rt_Bst 3D 10/10 2 5/5 6X, 10X      09/2020 -  Anti-estrogen oral therapy   Tamoxifen daily     CHIEF COMPLIANT: Follow-up of right breast cancer  INTERVAL HISTORY: Cathy Parrish is a 51 y.o. with above-mentioned history of right breast cancer who completed neoadjuvant chemotherapy, underwent bilateral lumpectomies, and radiation therapy. Mammogram on 01/20/2021 showed no evidence of malignancy. She reports to the clinic today for follow-up.  Leg swelling has improved with Lasix.  Ultrasound of the lower extremity ruled out DVT.  She is not able to use compression stockings because they are too tight.  ALLERGIES:  is allergic to latex.  MEDICATIONS:  Current Outpatient Medications  Medication Sig Dispense Refill   acetaminophen (TYLENOL) 650 MG CR tablet Take 1,300 mg by mouth every 8 (eight) hours as needed for pain.     ALPRAZolam (XANAX) 0.5 MG tablet TAKE 1 TABLET(0.5 MG) BY MOUTH AT BEDTIME AS NEEDED FOR ANXIETY 30 tablet 3   atorvastatin (LIPITOR) 20 MG tablet Take 1 tablet (20 mg total) by mouth daily. 90 tablet 3   Cholecalciferol (VITAMIN D) 125 MCG (5000  UT) CAPS Take 5,000 Units by mouth daily.     dicyclomine (BENTYL) 10 MG capsule TAKE 1 CAPSULE(10 MG) BY MOUTH THREE TIMES DAILY AS NEEDED FOR SPASMS 30 capsule 0   furosemide (LASIX) 20 MG tablet TAKE 1 TABLET(20 MG) BY MOUTH  DAILY 90 tablet 0   gabapentin (NEURONTIN) 300 MG capsule Take 1 capsule (300 mg total) by mouth at bedtime. 90 capsule 3   ibuprofen (ADVIL) 800 MG tablet Take 800 mg by mouth every 8 (eight) hours as needed for moderate pain.     irbesartan-hydrochlorothiazide (AVALIDE) 150-12.5 MG tablet Take 1 tablet by mouth daily. 90 tablet 2   loratadine (CLARITIN) 10 MG tablet Take 10 mg by mouth daily.     SUMAtriptan (IMITREX) 50 MG tablet Take one tablet by mouth at the first sign of headache. May repeat in 2 hours if headache persists or recurs. (Patient taking differently: Take 50 mg by mouth every 2 (two) hours as needed. May repeat in 2 hours if headache persists or recurs.) 10 tablet 0   tamoxifen (NOLVADEX) 20 MG tablet Take 1 tablet (20 mg total) by mouth daily. 90 tablet 3   tetrahydrozoline 0.05 % ophthalmic solution Place 1 drop into both eyes daily as needed (dry/irritated eyes).     No current facility-administered medications for this visit.    PHYSICAL EXAMINATION: ECOG PERFORMANCE STATUS: 1 - Symptomatic but completely ambulatory  Vitals:   03/04/21 1114  BP: 92/66  Pulse: (!) 109  Resp: 18  Temp: (!) 97.5 F (36.4 C)  SpO2: 100%   Filed Weights   03/04/21 1114  Weight: 263 lb 8 oz (119.5 kg)    BREAST: No palpable masses or nodules in either right or left breasts. No palpable axillary supraclavicular or infraclavicular adenopathy no breast tenderness or nipple discharge. (exam performed in the presence of a chaperone)  LABORATORY DATA:  I have reviewed the data as listed CMP Latest Ref Rng & Units 01/20/2021 09/09/2020 06/17/2020  Glucose 70 - 99 mg/dL 126(H) 109(H) 105(H)  BUN 6 - 20 mg/dL 17 17 11   Creatinine 0.44 - 1.00 mg/dL 0.71 0.76 0.66  Sodium 135 - 145 mmol/L 141 140 137  Potassium 3.5 - 5.1 mmol/L 3.8 3.5 3.3(L)  Chloride 98 - 111 mmol/L 106 102 101  CO2 22 - 32 mmol/L 26 27 27   Calcium 8.9 - 10.3 mg/dL 8.8(L) 9.5 9.3  Total Protein 6.5 - 8.1 g/dL 7.0 7.4 -   Total Bilirubin 0.3 - 1.2 mg/dL 0.4 0.4 -  Alkaline Phos 38 - 126 U/L 70 115 -  AST 15 - 41 U/L 13(L) 17 -  ALT 0 - 44 U/L 13 16 -    Lab Results  Component Value Date   WBC 5.7 03/04/2021   HGB 12.7 03/04/2021   HCT 36.4 03/04/2021   MCV 83.7 03/04/2021   PLT 268 03/04/2021   NEUTROABS 2.4 03/04/2021    ASSESSMENT & PLAN:  Malignant neoplasm of upper-outer quadrant of right breast in female, estrogen receptor positive ([place]) 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%, right axillary lymph node biopsied benign but discordant T2N?M0 stage IIa versus stage IIIa (based on the lymph node)   Treatment plan: 1.  Neoadjuvant chemotherapy with dose dense Adriamycin and Cytoxan followed by Taxol (stopped after 7  cycles) and Carbo 2. Bilateral Lumpectomies: Complete response 2 SLN Neg 3.  Adjuvant radiation 08/08/2020-09/22/2020  4.  Followed by adjuvant antiestrogen therapy with tamoxifen  --------------------------------------------------------------------------------------------------------------------------------------------------   Tamoxifen toxicities: 1.  Hot flashes: Extremely severe, recommended holding tamoxifen for 2 weeks and then resuming at half a tablet daily 2. Fatigue: Labs have been performed, CBC looks normal.   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  The patient has a good understanding of the overall plan. she agrees with it. she will call with any problems that may develop before the next visit here.  Total time spent: 20 mins including face to face time and time spent for planning, charting and coordination of care  Rulon Eisenmenger, MD, MPH 03/04/2021  I, Thana Ates, am acting as scribe for Dr. Nicholas Lose.  I have reviewed the above documentation for accuracy and completeness, and I agree with the above.

## 2021-03-04 ENCOUNTER — Other Ambulatory Visit: Payer: Self-pay

## 2021-03-04 ENCOUNTER — Encounter: Payer: Self-pay | Admitting: Licensed Clinical Social Worker

## 2021-03-04 ENCOUNTER — Inpatient Hospital Stay: Payer: BC Managed Care – PPO

## 2021-03-04 ENCOUNTER — Inpatient Hospital Stay: Payer: BC Managed Care – PPO | Attending: Hematology and Oncology | Admitting: Hematology and Oncology

## 2021-03-04 DIAGNOSIS — Z170 Estrogen receptor positive status [ER+]: Secondary | ICD-10-CM | POA: Diagnosis not present

## 2021-03-04 DIAGNOSIS — Z79810 Long term (current) use of selective estrogen receptor modulators (SERMs): Secondary | ICD-10-CM | POA: Insufficient documentation

## 2021-03-04 DIAGNOSIS — Z9221 Personal history of antineoplastic chemotherapy: Secondary | ICD-10-CM | POA: Insufficient documentation

## 2021-03-04 DIAGNOSIS — C50411 Malignant neoplasm of upper-outer quadrant of right female breast: Secondary | ICD-10-CM | POA: Insufficient documentation

## 2021-03-04 DIAGNOSIS — Z923 Personal history of irradiation: Secondary | ICD-10-CM | POA: Insufficient documentation

## 2021-03-04 DIAGNOSIS — Z79899 Other long term (current) drug therapy: Secondary | ICD-10-CM | POA: Insufficient documentation

## 2021-03-04 LAB — CBC WITH DIFFERENTIAL (CANCER CENTER ONLY)
Abs Immature Granulocytes: 0.01 10*3/uL (ref 0.00–0.07)
Basophils Absolute: 0 10*3/uL (ref 0.0–0.1)
Basophils Relative: 1 %
Eosinophils Absolute: 0.3 10*3/uL (ref 0.0–0.5)
Eosinophils Relative: 5 %
HCT: 36.4 % (ref 36.0–46.0)
Hemoglobin: 12.7 g/dL (ref 12.0–15.0)
Immature Granulocytes: 0 %
Lymphocytes Relative: 37 %
Lymphs Abs: 2.1 10*3/uL (ref 0.7–4.0)
MCH: 29.2 pg (ref 26.0–34.0)
MCHC: 34.9 g/dL (ref 30.0–36.0)
MCV: 83.7 fL (ref 80.0–100.0)
Monocytes Absolute: 0.9 10*3/uL (ref 0.1–1.0)
Monocytes Relative: 15 %
Neutro Abs: 2.4 10*3/uL (ref 1.7–7.7)
Neutrophils Relative %: 42 %
Platelet Count: 268 10*3/uL (ref 150–400)
RBC: 4.35 MIL/uL (ref 3.87–5.11)
RDW: 14.9 % (ref 11.5–15.5)
WBC Count: 5.7 10*3/uL (ref 4.0–10.5)
nRBC: 0 % (ref 0.0–0.2)

## 2021-03-04 LAB — CMP (CANCER CENTER ONLY)
ALT: 12 U/L (ref 0–44)
AST: 13 U/L — ABNORMAL LOW (ref 15–41)
Albumin: 4 g/dL (ref 3.5–5.0)
Alkaline Phosphatase: 69 U/L (ref 38–126)
Anion gap: 11 (ref 5–15)
BUN: 17 mg/dL (ref 6–20)
CO2: 28 mmol/L (ref 22–32)
Calcium: 9.2 mg/dL (ref 8.9–10.3)
Chloride: 99 mmol/L (ref 98–111)
Creatinine: 0.74 mg/dL (ref 0.44–1.00)
GFR, Estimated: >60 mL/min (ref >60)
Glucose, Bld: 129 mg/dL — ABNORMAL HIGH (ref 70–99)
Potassium: 3.1 mmol/L — ABNORMAL LOW (ref 3.5–5.1)
Sodium: 138 mmol/L (ref 135–145)
Total Bilirubin: 0.4 mg/dL (ref 0.3–1.2)
Total Protein: 7.5 g/dL (ref 6.5–8.1)

## 2021-03-04 LAB — MAGNESIUM: Magnesium: 1.4 mg/dL — ABNORMAL LOW (ref 1.7–2.4)

## 2021-03-04 MED ORDER — POTASSIUM CHLORIDE CRYS ER 20 MEQ PO TBCR: 20.0000 meq | EXTENDED_RELEASE_TABLET | Freq: Two times a day (BID) | ORAL | 1 refills | Status: DC

## 2021-03-04 NOTE — Assessment & Plan Note (Signed)
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%, HER-2 negative 12 o'clock position: 1.9 cm: Biopsy: Grade 3 IDC ER 40%(weakly positive), PR 0%, HER-2 negative, Ki-67 40%, right axillary lymph node biopsied benign but discordant T2N?M0 stage IIa versus stage IIIa (based on the lymph node)  Treatment plan: 1.Neoadjuvant chemotherapy with dose dense Adriamycin and Cytoxan followed by Taxol(stopped after 7 cycles)and Carbo 2.Bilateral Lumpectomies: Complete response 2 SLN Neg 3.Adjuvant radiation6/11/2020-09/22/2020 4.Followed by adjuvant antiestrogen therapy with tamoxifen -------------------------------------------------------------------------------------------------------------------------------------------------- estradiol and FSH levels41.3, estradiol 20.2.(still perimenoausal) I recommended starting her on tamoxifen 20 mg daily. We can recheck her [facility] and estradiol levels in a year and see if we can switch her to anastrozole therapy.  Tamoxifen toxicities: 1.  Hot flashes: Extremely severe, recommended holding tamoxifen for 2 weeks and then resuming at half a tablet daily 2. Fatigue: Labs have been performed, CBC looks normal.  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

## 2021-03-04 NOTE — Progress Notes (Signed)
[facility] CSW Progress Note  Holiday representative met with patient to complete [employer] application.  Application finished and CSW submitted today. Also provided information on [facility] program through DSS for potential assistance with utility bill.  Pt otherwise reports that she is doing well overall. Still has moments of anxiety and depression but is taking her medication and planning to restart counseling through [facility].  She is also setting goals for healthier habits to help lose weight. Discussed noticing her energy levels, sleep, and mood changes as she adopts a healthier lifestyle in addition to the number on the scale to help stay motivated.    [name] , LCSW

## 2021-03-05 ENCOUNTER — Other Ambulatory Visit: Payer: BC Managed Care – PPO

## 2021-03-06 ENCOUNTER — Other Ambulatory Visit: Payer: Self-pay | Admitting: *Deleted

## 2021-03-06 MED ORDER — POTASSIUM CHLORIDE CRYS ER 20 MEQ PO TBCR: 20.0000 meq | EXTENDED_RELEASE_TABLET | Freq: Two times a day (BID) | ORAL | 1 refills | Status: DC

## 2021-04-03 DIAGNOSIS — F419 Anxiety disorder, unspecified: Secondary | ICD-10-CM | POA: Diagnosis not present

## 2021-04-03 DIAGNOSIS — G620 Drug-induced polyneuropathy: Secondary | ICD-10-CM | POA: Diagnosis not present

## 2021-04-03 DIAGNOSIS — Z0000 Encounter for general adult medical examination without abnormal findings: Secondary | ICD-10-CM | POA: Diagnosis not present

## 2021-04-03 DIAGNOSIS — I10 Essential (primary) hypertension: Secondary | ICD-10-CM | POA: Diagnosis not present

## 2021-04-03 DIAGNOSIS — E785 Hyperlipidemia, unspecified: Secondary | ICD-10-CM | POA: Diagnosis not present

## 2021-04-29 ENCOUNTER — Other Ambulatory Visit: Payer: Self-pay | Admitting: Hematology and Oncology

## 2021-04-29 NOTE — Progress Notes (Signed)
Patient Care Team: Forrest Moron, MD as PCP - General (Internal Medicine) Nicholas Lose, MD as Consulting Physician (Hematology and Oncology) Kyung Rudd, MD as Consulting Physician (Radiation Oncology) Stark Klein, MD as Consulting Physician (General Surgery) Servando Salina, MD as Consulting Physician (Obstetrics and Gynecology)  DIAGNOSIS:    ICD-10-CM   1. Malignant neoplasm of upper-outer quadrant of right breast in female, estrogen receptor positive ([name])  C50.411    Z17.0       SUMMARY OF ONCOLOGIC HISTORY: Oncology History  Malignant neoplasm of upper-outer quadrant of right breast in female, estrogen receptor positive ([name])  12/13/2019 Initial Diagnosis   Screening detected right breast masses 11:30 position: 4.3 cm: Biopsy grade 3 IDC ER 90% PR 0%, KI 40%, HER-2 negative 12 o'clock position: 1.9 cm: Biopsy: Grade 3 IDC ER 40%, PR 0%, HER-2 negative, Ki-67 40%, right axillary lymph node biopsied benign but discordant   12/19/2019 Cancer Staging   Staging form: Breast, AJCC 8th Edition - Clinical stage from 12/19/2019: Stage IIIA (cT2, cN1, cM0, G3, ER+, PR-, HER2-) - Signed by Nicholas Lose, MD on 12/19/2019    01/03/2020 - 05/01/2020 Neo-Adjuvant Chemotherapy   Adriamycin and Cytoxan x 4 01/03/2020-02/14/2020 Taxol/Carbo x 6 02/29/2020-05/01/2020   02/28/2020 Genetic Testing   Negative genetic testing:  No pathogenic variants detected on the [facility] CustomNext-Cancer + RNAinsight panel. The report date is 02/28/2020.   The CustomNext-Cancer+RNAinsight panel offered by [facility] includes sequencing and rearrangement analysis for the following 47 genes:  APC, ATM, AXIN2, BARD1, BMPR1A, BRCA1, BRCA2, BRIP1, CDH1, CDK4, CDKN2A, CHEK2, DICER1, EPCAM, GREM1, HOXB13, MEN1, MLH1, MSH2, MSH3, MSH6, MUTYH, NBN, NF1, NF2, NTHL1, PALB2, PMS2, POLD1, POLE, PTEN, RAD51C, RAD51D, RECQL, RET, SDHA, SDHAF2, SDHB, SDHC, SDHD, SMAD4, SMARCA4, STK11, TP53, TSC1, TSC2, and VHL.  RNA  data is routinely analyzed for use in variant interpretation for all genes.   06/24/2020 Surgery   Bilateral lumpectomies Barry Dienes):  Left breast: fibrocystic changes with usual ductal hyperplasia Right breast: no evidence of malignancy, with 2 right axillary lymph nodes negative for carcinoma   08/04/2020 - 09/23/2020 Radiation Therapy   Adjuvant radiation therapy 08/04/2020 through 09/23/2020 Site Technique Total Dose (Gy) Dose per Fx (Gy) Completed Fx Beam Energies  Breast, Right: Breast_Rt 3D 50.4/50.4 1.8 28/28 10X  Breast, Right: Breast_Rt_SCLV 3D 50.4/50.4 1.8 28/28 6X, 10X  Breast, Right: Breast_Rt_Bst 3D 10/10 2 5/5 6X, 10X      09/2020 -  Anti-estrogen oral therapy   Tamoxifen daily     CHIEF COMPLIANT: Follow-up of right breast cancer  INTERVAL HISTORY: Cathy Parrish is a 51 y.o. with above-mentioned history of right breast cancer who completed neoadjuvant chemotherapy, underwent bilateral lumpectomies, and radiation therapy. She reports to the clinic today for follow-up.  Continues to suffer from neuropathy which she rates it as 8 out of 10.  Continues to have hot flashes and fatigue but they are slightly better than before.  She is able to handle half a tablet of tamoxifen.  ALLERGIES:  is allergic to latex.  MEDICATIONS:  Current Outpatient Medications  Medication Sig Dispense Refill   acetaminophen (TYLENOL) 650 MG CR tablet Take 1,300 mg by mouth every 8 (eight) hours as needed for pain.     ALPRAZolam (XANAX) 0.5 MG tablet TAKE 1 TABLET(0.5 MG) BY MOUTH AT BEDTIME AS NEEDED FOR ANXIETY 30 tablet 3   atorvastatin (LIPITOR) 20 MG tablet Take 1 tablet (20 mg total) by mouth daily. 90 tablet 3   Cholecalciferol (VITAMIN D)  125 MCG (5000 UT) CAPS Take 5,000 Units by mouth daily.     dicyclomine (BENTYL) 10 MG capsule TAKE 1 CAPSULE(10 MG) BY MOUTH THREE TIMES DAILY AS NEEDED FOR SPASMS 30 capsule 0   furosemide (LASIX) 20 MG tablet TAKE 1 TABLET(20 MG) BY MOUTH DAILY 90  tablet 0   gabapentin (NEURONTIN) 300 MG capsule Take 1 capsule (300 mg total) by mouth at bedtime. 90 capsule 3   ibuprofen (ADVIL) 800 MG tablet Take 800 mg by mouth every 8 (eight) hours as needed for moderate pain.     irbesartan-hydrochlorothiazide (AVALIDE) 150-12.5 MG tablet Take 1 tablet by mouth daily. 90 tablet 2   loratadine (CLARITIN) 10 MG tablet Take 10 mg by mouth daily.     potassium chloride SA (KLOR-CON M) 20 MEQ tablet Take 1 tablet (20 mEq total) by mouth 2 (two) times daily. 30 tablet 1   SUMAtriptan (IMITREX) 50 MG tablet Take one tablet by mouth at the first sign of headache. May repeat in 2 hours if headache persists or recurs. (Patient taking differently: Take 50 mg by mouth every 2 (two) hours as needed. May repeat in 2 hours if headache persists or recurs.) 10 tablet 0   tamoxifen (NOLVADEX) 20 MG tablet Take 0.5 tablets (10 mg total) by mouth daily. 90 tablet 3   tetrahydrozoline 0.05 % ophthalmic solution Place 1 drop into both eyes daily as needed (dry/irritated eyes).     No current facility-administered medications for this visit.    PHYSICAL EXAMINATION: ECOG PERFORMANCE STATUS: 1 - Symptomatic but completely ambulatory  Vitals:   04/30/21 1443  BP: 133/67  Pulse: 80  Resp: 18  Temp: (!) 97.3 F (36.3 C)  SpO2: 98%   Filed Weights   04/30/21 1443  Weight: 260 lb 3.2 oz (118 kg)      LABORATORY DATA:  I have reviewed the data as listed CMP Latest Ref Rng & Units 03/04/2021 01/20/2021 09/09/2020  Glucose 70 - 99 mg/dL 129(H) 126(H) 109(H)  BUN 6 - 20 mg/dL 17 17 17   Creatinine 0.44 - 1.00 mg/dL 0.74 0.71 0.76  Sodium 135 - 145 mmol/L 138 141 140  Potassium 3.5 - 5.1 mmol/L 3.1(L) 3.8 3.5  Chloride 98 - 111 mmol/L 99 106 102  CO2 22 - 32 mmol/L 28 26 27   Calcium 8.9 - 10.3 mg/dL 9.2 8.8(L) 9.5  Total Protein 6.5 - 8.1 g/dL 7.5 7.0 7.4  Total Bilirubin 0.3 - 1.2 mg/dL 0.4 0.4 0.4  Alkaline Phos 38 - 126 U/L 69 70 115  AST 15 - 41 U/L 13(L) 13(L)  17  ALT 0 - 44 U/L 12 13 16     Lab Results  Component Value Date   WBC 7.0 04/30/2021   HGB 13.1 04/30/2021   HCT 38.7 04/30/2021   MCV 85.1 04/30/2021   PLT 266 04/30/2021   NEUTROABS 3.1 04/30/2021    ASSESSMENT & PLAN:  Malignant neoplasm of upper-outer quadrant of right breast in female, estrogen receptor positive ([place]) 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%, right axillary lymph node biopsied benign but discordant T2N?M0 stage IIa versus stage IIIa (based on the lymph node)   Treatment plan: 1.  Neoadjuvant chemotherapy with dose dense Adriamycin and Cytoxan followed by Taxol (stopped after 7 cycles) and Carbo 2. Bilateral  Lumpectomies: Complete response 2 SLN Neg 3.  Adjuvant radiation 08/08/2020-09/22/2020  4.  Followed by adjuvant antiestrogen therapy with tamoxifen  --------------------------------------------------------------------------------------------------------------------------------------------------   Tamoxifen toxicities: 1.  Hot flashes: Extremely severe, recommended holding tamoxifen for 2 weeks and then resuming at half a tablet daily 2. Fatigue: Labs have been performed, CBC looks normal.   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  Return to  clinic in March for follow-up      No orders of the defined types were placed in this encounter.  The patient has a good understanding of the overall plan. she agrees with it. she will call with any problems that may develop before the next visit here.  Total time spent: 20 mins including face to face time and time spent for planning, charting and coordination of care  Rulon Eisenmenger, MD, MPH 04/30/2021  I, Thana Ates, am acting as scribe for Dr. Nicholas Lose.  I have reviewed the above documentation for accuracy and completeness, and I agree with the above.

## 2021-04-29 NOTE — Assessment & Plan Note (Signed)
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%, HER-2 negative ?12 o'clock position: 1.9 cm: Biopsy: Grade 3 IDC ER 40%?(weakly positive), PR 0%, HER-2 negative, Ki-67 40%, right axillary lymph node biopsied benign but discordant ?T2N?M0 stage IIa versus stage IIIa (based on the lymph node) ?? ?Treatment plan: ?1.??Neoadjuvant chemotherapy with dose dense Adriamycin and Cytoxan followed by Taxol?(stopped after 7 cycles)?and Carbo ?2.?Bilateral Lumpectomies: Complete response 2 SLN Neg ?3.??Adjuvant radiation?08/08/2020-09/22/2020? ?4.??Followed by adjuvant antiestrogen therapy with?tamoxifen? ?-------------------------------------------------------------------------------------------------------------------------------------------------- ?  ?Tamoxifen toxicities: ?1.??Hot flashes: Extremely severe, recommended holding tamoxifen for 2 weeks and then resuming at half a tablet daily ?2.?Fatigue: Labs have been performed, CBC looks normal. ?? 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 ?Plan: Tamoxifen resumed at half dose, tolerating it extremely well ?? ?Ultrasound lower extremity: 01/20/2021: No evidence of DVT ?Leg swelling: Lasix, slowly improving, I discussed with her about using Ace bandage to wrap her legs to prevent the leg swelling ?Hypokalemia: Sent potassium oral pills ?Return to clinic in March for follow-up ?? ?

## 2021-04-30 ENCOUNTER — Inpatient Hospital Stay: Payer: BC Managed Care – PPO | Attending: Hematology and Oncology

## 2021-04-30 ENCOUNTER — Inpatient Hospital Stay: Payer: BC Managed Care – PPO | Admitting: Emergency Medicine

## 2021-04-30 ENCOUNTER — Other Ambulatory Visit: Payer: Self-pay

## 2021-04-30 ENCOUNTER — Inpatient Hospital Stay: Payer: BC Managed Care – PPO | Admitting: Hematology and Oncology

## 2021-04-30 DIAGNOSIS — Z923 Personal history of irradiation: Secondary | ICD-10-CM | POA: Insufficient documentation

## 2021-04-30 DIAGNOSIS — Z170 Estrogen receptor positive status [ER+]: Secondary | ICD-10-CM | POA: Insufficient documentation

## 2021-04-30 DIAGNOSIS — R5383 Other fatigue: Secondary | ICD-10-CM | POA: Insufficient documentation

## 2021-04-30 DIAGNOSIS — C50411 Malignant neoplasm of upper-outer quadrant of right female breast: Secondary | ICD-10-CM | POA: Diagnosis not present

## 2021-04-30 DIAGNOSIS — Z79810 Long term (current) use of selective estrogen receptor modulators (SERMs): Secondary | ICD-10-CM | POA: Diagnosis not present

## 2021-04-30 DIAGNOSIS — G620 Drug-induced polyneuropathy: Secondary | ICD-10-CM | POA: Insufficient documentation

## 2021-04-30 DIAGNOSIS — N951 Menopausal and female climacteric states: Secondary | ICD-10-CM | POA: Diagnosis not present

## 2021-04-30 DIAGNOSIS — Z9221 Personal history of antineoplastic chemotherapy: Secondary | ICD-10-CM | POA: Insufficient documentation

## 2021-04-30 LAB — CBC WITH DIFFERENTIAL (CANCER CENTER ONLY)
Abs Immature Granulocytes: 0.01 10*3/uL (ref 0.00–0.07)
Basophils Absolute: 0 10*3/uL (ref 0.0–0.1)
Basophils Relative: 0 %
Eosinophils Absolute: 0.3 10*3/uL (ref 0.0–0.5)
Eosinophils Relative: 4 %
HCT: 38.7 % (ref 36.0–46.0)
Hemoglobin: 13.1 g/dL (ref 12.0–15.0)
Immature Granulocytes: 0 %
Lymphocytes Relative: 39 %
Lymphs Abs: 2.8 10*3/uL (ref 0.7–4.0)
MCH: 28.8 pg (ref 26.0–34.0)
MCHC: 33.9 g/dL (ref 30.0–36.0)
MCV: 85.1 fL (ref 80.0–100.0)
Monocytes Absolute: 0.8 10*3/uL (ref 0.1–1.0)
Monocytes Relative: 11 %
Neutro Abs: 3.1 10*3/uL (ref 1.7–7.7)
Neutrophils Relative %: 46 %
Platelet Count: 266 10*3/uL (ref 150–400)
RBC: 4.55 MIL/uL (ref 3.87–5.11)
RDW: 14.9 % (ref 11.5–15.5)
WBC Count: 7 10*3/uL (ref 4.0–10.5)
nRBC: 0 % (ref 0.0–0.2)

## 2021-04-30 LAB — CMP (CANCER CENTER ONLY)
ALT: 14 U/L (ref 0–44)
AST: 14 U/L — ABNORMAL LOW (ref 15–41)
Albumin: 4 g/dL (ref 3.5–5.0)
Alkaline Phosphatase: 70 U/L (ref 38–126)
Anion gap: 7 (ref 5–15)
BUN: 14 mg/dL (ref 6–20)
CO2: 30 mmol/L (ref 22–32)
Calcium: 9.3 mg/dL (ref 8.9–10.3)
Chloride: 99 mmol/L (ref 98–111)
Creatinine: 0.63 mg/dL (ref 0.44–1.00)
GFR, Estimated: >60 mL/min (ref >60)
Glucose, Bld: 93 mg/dL (ref 70–99)
Potassium: 3.3 mmol/L — ABNORMAL LOW (ref 3.5–5.1)
Sodium: 136 mmol/L (ref 135–145)
Total Bilirubin: 0.5 mg/dL (ref 0.3–1.2)
Total Protein: 7.3 g/dL (ref 6.5–8.1)

## 2021-04-30 MED ORDER — TAMOXIFEN CITRATE 20 MG PO TABS: 10.0000 mg | ORAL_TABLET | Freq: Every day | ORAL | 3 refills | Status: DC

## 2021-04-30 NOTE — Research (Signed)
ACCRU-[place]-2102 - TREATMENT OF ESTABLISHED CHEMOTHERAPY-INDUCED NEUROPATHY WITH N-PALMITOYLETHANOLAMIDE, A CANNABIMIMETIC NUTRACEUTICAL: A RANDOMIZED DOUBLE-BLIND PHASE II PILOT TRIAL ? ?INTRO STUDY/CONSENTS ? ?Patient Cathy Parrish was identified by MD Lindi Adie as a potential candidate for the above listed study.  This Clinical Research Nurse met with Avanell Banwart, [id number], on 04/30/21 in a manner and location that ensures patient privacy to discuss participation in the above listed research study.  Patient is Unaccompanied.  A copy of the informed consent document with embedded HIPAA language was provided to the patient.  Patient reads, speaks, and understands [place].   ?Patient was provided with the business card of this Nurse and encouraged to contact the research team with any questions.  Approximately 20 minutes were spent with the patient reviewing the informed consent documents.  Patient was provided the option of taking informed consent documents home to review and was encouraged to review at their convenience with their support network, including other care providers. Patient took the consent documents home to review.  Will f/u with patient in a few days to determine interest. ? ?Wells Guiles 'LizaNeysa Bonito, RN, BSN ?Clinical Research Nurse I ?04/30/21 ?3:29 PM ? ? ?

## 2021-05-01 DIAGNOSIS — F419 Anxiety disorder, unspecified: Secondary | ICD-10-CM | POA: Diagnosis not present

## 2021-05-01 DIAGNOSIS — I10 Essential (primary) hypertension: Secondary | ICD-10-CM | POA: Diagnosis not present

## 2021-05-04 ENCOUNTER — Telehealth: Payer: Self-pay

## 2021-05-04 ENCOUNTER — Telehealth: Payer: Self-pay | Admitting: Emergency Medicine

## 2021-05-04 NOTE — Telephone Encounter (Signed)
ACCRU-[place]-2102 - TREATMENT OF ESTABLISHED CHEMOTHERAPY-INDUCED NEUROPATHY WITH N-PALMITOYLETHANOLAMIDE, A CANNABIMIMETIC NUTRACEUTICAL: A RANDOMIZED DOUBLE-BLIND PHASE II PILOT TRIAL ? ?Called pt to f/u on potential interest in ACCRU study.  Pt declined, wants to continue with current management of CIPN.  Pt denies any questions/concerns at this time. ? ?Wells Guiles 'Learta Codding' Annie Roseboom, RN, BSN ?Clinical Research Nurse I ?05/04/21 ?1:34 PM ? ?

## 2021-05-04 NOTE — Telephone Encounter (Signed)
-----   Message from Gardenia Phlegm, NP sent at 05/01/2021  8:47 AM EST ----- ?Please call patient and recommend increase potassium rich foods. ?----- Message ----- ?From: Interface, [facility] In [place] ?Sent: 04/30/2021   2:44 PM EST ?To: Gardenia Phlegm, NP ? ? ?

## 2021-05-04 NOTE — Telephone Encounter (Signed)
Called and spoke with pt, per [facility], pt potassium is low.  Advised pt to increase potassium in her diet, pt verbalized understanding and thanks. ?

## 2021-06-12 DIAGNOSIS — I10 Essential (primary) hypertension: Secondary | ICD-10-CM | POA: Diagnosis not present

## 2021-06-12 DIAGNOSIS — F419 Anxiety disorder, unspecified: Secondary | ICD-10-CM | POA: Diagnosis not present

## 2021-07-16 ENCOUNTER — Telehealth: Payer: Self-pay | Admitting: *Deleted

## 2021-07-16 NOTE — Telephone Encounter (Signed)
Received call from pt with complaint of right breast nodule and right axilla pain x2 weeks.  Pt denies recent injury, trauma, redness, swelling, or drainage at this time.  Pt requesting office visit for breast exam and recommendations.  Appt scheduled, pt verbalized understanding of appt date and time.

## 2021-07-21 ENCOUNTER — Other Ambulatory Visit: Payer: Self-pay

## 2021-07-21 ENCOUNTER — Inpatient Hospital Stay: Payer: BC Managed Care – PPO | Attending: Hematology and Oncology | Admitting: Adult Health

## 2021-07-21 ENCOUNTER — Encounter: Payer: Self-pay | Admitting: Adult Health

## 2021-07-21 VITALS — BP 128/67 | HR 65 | Temp 98.1°F | Resp 18 | Ht 65.5 in | Wt 266.0 lb

## 2021-07-21 DIAGNOSIS — Z79810 Long term (current) use of selective estrogen receptor modulators (SERMs): Secondary | ICD-10-CM | POA: Diagnosis not present

## 2021-07-21 DIAGNOSIS — Z923 Personal history of irradiation: Secondary | ICD-10-CM | POA: Insufficient documentation

## 2021-07-21 DIAGNOSIS — N6310 Unspecified lump in the right breast, unspecified quadrant: Secondary | ICD-10-CM | POA: Insufficient documentation

## 2021-07-21 DIAGNOSIS — Z170 Estrogen receptor positive status [ER+]: Secondary | ICD-10-CM | POA: Diagnosis not present

## 2021-07-21 DIAGNOSIS — Z9221 Personal history of antineoplastic chemotherapy: Secondary | ICD-10-CM | POA: Insufficient documentation

## 2021-07-21 DIAGNOSIS — C50411 Malignant neoplasm of upper-outer quadrant of right female breast: Secondary | ICD-10-CM | POA: Insufficient documentation

## 2021-07-21 NOTE — Progress Notes (Signed)
[facility] Follow up:    Cathy Moron, MD [phone] [place] [address]   DIAGNOSIS:  Cancer Staging  Malignant neoplasm of upper-outer quadrant of right breast in female, estrogen receptor positive ([place]) Staging form: Breast, AJCC 8th Edition - Clinical stage from 12/19/2019: Stage IIIA (cT2, cN1, cM0, G3, ER+, PR-, HER2-) - Signed by Nicholas Lose, MD on 12/19/2019 Stage prefix: Initial diagnosis Method of lymph node assessment: Clinical Histologic grading system: 3 grade system - Pathologic stage from 06/24/2020: No Stage Recommended (ypT0, pN0, cM0) - Unsigned Stage prefix: Post-therapy   SUMMARY OF ONCOLOGIC HISTORY: Oncology History  Malignant neoplasm of upper-outer quadrant of right breast in female, estrogen receptor positive ([place])  12/13/2019 Initial Diagnosis   Screening detected right breast masses 11:30 position: 4.3 cm: Biopsy grade 3 IDC ER 90% PR 0%, KI 40%, HER-2 negative 12 o'clock position: 1.9 cm: Biopsy: Grade 3 IDC ER 40%, PR 0%, HER-2 negative, Ki-67 40%, right axillary lymph node biopsied benign but discordant   12/19/2019 Cancer Staging   Staging form: Breast, AJCC 8th Edition - Clinical stage from 12/19/2019: Stage IIIA (cT2, cN1, cM0, G3, ER+, PR-, HER2-) - Signed by Nicholas Lose, MD on 12/19/2019    01/03/2020 - 05/01/2020 Neo-Adjuvant Chemotherapy   Adriamycin and Cytoxan x 4 01/03/2020-02/14/2020 Taxol/Carbo x 6 02/29/2020-05/01/2020   02/28/2020 Genetic Testing   Negative genetic testing:  No pathogenic variants detected on the [facility] CustomNext-Cancer + RNAinsight panel. The report date is 02/28/2020.   The CustomNext-Cancer+RNAinsight panel offered by [facility] includes sequencing and rearrangement analysis for the following 47 genes:  APC, ATM, AXIN2, BARD1, BMPR1A, BRCA1, BRCA2, BRIP1, CDH1, CDK4, CDKN2A, CHEK2, DICER1, EPCAM, GREM1, HOXB13, MEN1, MLH1, MSH2, MSH3, MSH6, MUTYH, NBN, NF1, NF2,  NTHL1, PALB2, PMS2, POLD1, POLE, PTEN, RAD51C, RAD51D, RECQL, RET, SDHA, SDHAF2, SDHB, SDHC, SDHD, SMAD4, SMARCA4, STK11, TP53, TSC1, TSC2, and VHL.  RNA data is routinely analyzed for use in variant interpretation for all genes.   06/24/2020 Surgery   Bilateral lumpectomies [facility] Dienes):  Left breast: fibrocystic changes with usual ductal hyperplasia Right breast: no evidence of malignancy, with 2 right axillary lymph nodes negative for carcinoma   08/04/2020 - 09/23/2020 Radiation Therapy   Adjuvant radiation therapy 08/04/2020 through 09/23/2020 Site Technique Total Dose (Gy) Dose per Fx (Gy) Completed Fx Beam Energies  Breast, Right: Breast_Rt 3D 50.4/50.4 1.8 28/28 10X  Breast, Right: Breast_Rt_SCLV 3D 50.4/50.4 1.8 28/28 6X, 10X  Breast, Right: Breast_Rt_Bst 3D 10/10 2 5/5 6X, 10X      09/2020 -  Anti-estrogen oral therapy   Tamoxifen daily     CURRENT THERAPY: Tamoxifen  INTERVAL HISTORY: Cathy Parrish 51 y.o. female returns for urgent evaluation of a new right breast mass.  She noticed this about a week to a week and a half ago.  She feels like it is growing quickly and is very concerned about this.  She also has some thickness noted up into her axilla with pain and tenderness when touched.  She has no erythema on her skin.  She has no recent injections.  Her most recent mammogram was completed on November 26, 2020 and showed no evidence of new or recurrent breast malignancy and breast density category C.   Patient Active Problem List   Diagnosis Date Noted   Chemotherapy-induced peripheral neuropathy ([place]) 11/02/2020   Genetic testing 02/28/2020   Port-A-Cath in place 01/17/2020   Family history of breast cancer  Family history of colon cancer    Family history of liver cancer    Malignant neoplasm of upper-outer quadrant of right breast in female, estrogen receptor positive ([place]) 12/19/2019   B12 deficiency 07/12/2011   NSAID long-term use-[place] 07/12/2011   Weakness  07/11/2011   Iron deficiency anemia 07/11/2011   Hypertension     is allergic to latex.  MEDICAL HISTORY: Past Medical History:  Diagnosis Date   Anxiety    Arthritis    shoulders   Asthma    Breast cancer ([place]) 11/2019   Depression    Diabetes mellitus without complication ([place])    diet controlled   Family history of breast cancer    Family history of colon cancer    Family history of liver cancer    Headache    Hypertension    Personal history of chemotherapy    Personal history of radiation therapy    Stroke ([place])    TIA no deficits, 6 years ago per pt    SURGICAL HISTORY: Past Surgical History:  Procedure Laterality Date   AXILLARY SENTINEL NODE BIOPSY Right 06/24/2020   Procedure: RIGHT AXILLARY SENTINEL LYMPH NODE BIOPSY;  Surgeon: Stark Klein, MD;  Location: [facility];  Service: General;  Laterality: Right;   BREAST BIOPSY Right    BREAST LUMPECTOMY WITH RADIOACTIVE SEED AND SENTINEL LYMPH NODE BIOPSY Right 06/24/2020   Procedure: RIGHT BREAST LUMPECTOMY WITH RADIOACTIVE SEED X 2;  Surgeon: Stark Klein, MD;  Location: [facility];  Service: General;  Laterality: Right;   COLONOSCOPY  07/13/2011   Procedure: COLONOSCOPY;  Surgeon: Gatha Mayer, MD;  Location: [facility];  Service: Endoscopy;  Laterality: N/A;   DILATION AND CURETTAGE OF UTERUS  abortion   DILITATION & CURRETTAGE/HYSTROSCOPY WITH NOVASURE ABLATION N/A 09/28/2012   Procedure: DILATATION & CURETTAGE/HYSTEROSCOPY WITH NOVASURE ABLATION; And Resectoscope;  Surgeon: Marvene Staff, MD;  Location: [facility];  Service: Gynecology;  Laterality: N/A;   ESOPHAGOGASTRODUODENOSCOPY  07/13/2011   Procedure: ESOPHAGOGASTRODUODENOSCOPY (EGD);  Surgeon: Gatha Mayer, MD;  [facility]: [facility] END[facility]COPY;  Service: Endoscopy;  Laterality: N/A;   PORT-A-CATH REMOVAL Left 06/24/2020   Procedure: REMOVAL PORT-A-CATH;  Surgeon: Stark Klein, MD;  Location: [facility];  Service: General;  Laterality: Left;   PORTACATH PLACEMENT N/A  01/02/2020   Procedure: INSERTION PORT-A-CATH;  Surgeon: Stark Klein, MD;  Location: [facility];  Service: General;  Laterality: N/A;   RADIOACTIVE SEED GUIDED EXCISIONAL BREAST BIOPSY Left 06/24/2020   Procedure: RADIOACTIVE SEED GUIDED EXCISIONAL LEFT BREAST BIOPSY;  Surgeon: Stark Klein, MD;  Location: [facility];  Service: General;  Laterality: Left;   SENTINEL NODE BIOPSY Right 06/24/2020   Procedure: RADIOACTIVE SEED LOCALIZED RIGHT LYMPH NODE BIOPSY;  Surgeon: Stark Klein, MD;  Location: [facility];  Service: General;  Laterality: Right;   TONSILLECTOMY      SOCIAL HISTORY: Social History   Socioeconomic History   Marital status: Single    Spouse name: Not on file   Number of children: Not on file   Years of education: Not on file   Highest education level: Not on file  Occupational History   Not on file  Tobacco Use   Smoking status: Never   Smokeless tobacco: Never  Vaping Use   Vaping Use: Never used  Substance and Sexual Activity   Alcohol use: Not Currently   Drug use: No   Sexual activity: Yes    Birth control/protection: Condom  Other Topics Concern   Not on file  Social History Narrative   Not on file   Social Determinants of Health   Financial Resource Strain: Not on file  Food Insecurity: Not on file  Transportation Needs: Not on file  Physical Activity: Not on file  Stress: Not on file  Social Connections: Not on file  Intimate Partner Violence: Not on file    FAMILY HISTORY: Family History  Problem Relation Age of Onset   Liver cancer Father        dx early 53s   Breast cancer Maternal Aunt        dx 56s   Colon cancer Maternal Aunt        dx 83s   Cancer Paternal Uncle        unknown type, dx >50   Breast cancer Half-Sister 63   Esophageal cancer Neg Hx    Stomach cancer Neg Hx     Review of Systems  Constitutional:  Negative for appetite change, chills, fatigue, fever and unexpected weight change.  HENT:   Negative for  hearing loss, lump/mass and trouble swallowing.   Eyes:  Negative for eye problems and icterus.  Respiratory:  Negative for chest tightness, cough and shortness of breath.   Cardiovascular:  Negative for chest pain, leg swelling and palpitations.  Gastrointestinal:  Negative for abdominal distention, abdominal pain, constipation, diarrhea, nausea and vomiting.  Endocrine: Negative for hot flashes.  Genitourinary:  Negative for difficulty urinating.   Musculoskeletal:  Negative for arthralgias.  Skin:  Negative for itching and rash.  Neurological:  Negative for dizziness, extremity weakness, headaches and numbness.  Hematological:  Negative for adenopathy. Does not bruise/bleed easily.  Psychiatric/Behavioral:  Negative for depression. The patient is not nervous/anxious.      PHYSICAL EXAMINATION  ECOG PERFORMANCE STATUS: 1 - Symptomatic but completely ambulatory  Vitals:   07/21/21 1321  BP: 128/67  Pulse: 65  Resp: 18  Temp: 98.1 F (36.7 C)  SpO2: 99%    Physical Exam Constitutional:      General: She is not in acute distress.    Appearance: Normal appearance. She is not toxic-appearing.  HENT:     Head: Normocephalic and atraumatic.  Eyes:     General: No scleral icterus. Cardiovascular:     Rate and Rhythm: Normal rate and regular rhythm.     Pulses: Normal pulses.     Heart sounds: Normal heart sounds.  Pulmonary:     Effort: Pulmonary effort is normal.     Breath sounds: Normal breath sounds.  Chest:     Comments: Left breast is benign, right breast has a large 2 cm mass in the upper medial breast.  There is some thickness in the axilla with tenderness to palpation.  There is no erythema, swelling, or warmth noted.  The remainder of the breast has no nodularity or masses however does have expected radiation changes. Abdominal:     General: Abdomen is flat. Bowel sounds are normal. There is no distension.     Palpations: Abdomen is soft.     Tenderness: There is  no abdominal tenderness.  Musculoskeletal:        General: No swelling.     Cervical back: Neck supple.  Lymphadenopathy:     Cervical: No cervical adenopathy.  Skin:    General: Skin is warm and dry.     Findings: No rash.  Neurological:     General: No focal deficit present.     Mental Status: She is alert.  Psychiatric:  Mood and Affect: Mood normal.        Behavior: Behavior normal.    LABORATORY DATA:  None for this today  ASSESSMENT and THERAPY PLAN:   Malignant neoplasm of upper-outer quadrant of right breast in female, estrogen receptor positive ([place]) 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%, right axillary lymph node biopsied benign but discordant T2N?M0 stage IIa versus stage IIIa (based on the lymph node)   Treatment plan: 1.  Neoadjuvant chemotherapy with dose dense Adriamycin and Cytoxan followed by Taxol (stopped after 7 cycles) and Carbo 2. Bilateral Lumpectomies: Complete response 2 SLN Neg 3.  Adjuvant radiation 08/08/2020-09/22/2020  4.  Followed by adjuvant antiestrogen therapy with tamoxifen  --------------------------------------------------------------------------------------------------------------------------------------------------   Cathy Parrish was recommended to continue on tamoxifen daily.  She does have a new right breast mass and axillary thickness.  I have placed an order for a diagnostic right breast mammogram and right breast ultrasound for further evaluation.  I sent a message to the breast navigators at the breast center to expedite scheduling of this imaging request so that this can be promptly scheduled within the week.  I am concerned about the rate at which she describes it is growing.  My nurse Mickel Baas is also following up on this to ensure get  scheduled within the week.   All questions were answered. The patient knows to call the clinic with any problems, questions or concerns. We can certainly see the patient much sooner if necessary.  Total encounter time:20 minutes*in face-to-face visit time, chart review, lab review, care coordination, order entry, and documentation of the encounter time.  Wilber Bihari, NP 07/21/21 2:03 PM Medical Oncology and Hematology [facility] [place], [place] [address] Tel. [phone]    Fax. [phone]  *Total Encounter Time as defined by the Centers for Medicare and Medicaid Services includes, in addition to the face-to-face time of a patient visit (documented in the note above) non-face-to-face time: obtaining and reviewing outside history, ordering and reviewing medications, tests or procedures, care coordination (communications with other health care professionals or caregivers) and documentation in the medical record.

## 2021-07-21 NOTE — Assessment & Plan Note (Signed)
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%, HER-2 negative 12 o'clock position: 1.9 cm: Biopsy: Grade 3 IDC ER 40%(weakly positive), PR 0%, HER-2 negative, Ki-67 40%, right axillary lymph node biopsied benign but discordant T2N?M0 stage IIa versus stage IIIa (based on the lymph node)  Treatment plan: 1.Neoadjuvant chemotherapy with dose dense Adriamycin and Cytoxan followed by Taxol(stopped after 7 cycles)and Carbo 2.Bilateral Lumpectomies: Complete response 2 SLN Neg 3.Adjuvant radiation6/11/2020-09/22/2020 4.Followed by adjuvant antiestrogen therapy withtamoxifen --------------------------------------------------------------------------------------------------------------------------------------------------   Cathy Parrish was recommended to continue on tamoxifen daily.  She does have a new right breast mass and axillary thickness.  I have placed an order for a diagnostic right breast mammogram and right breast ultrasound for further evaluation.  I sent a message to the breast navigators at the breast center to expedite scheduling of this imaging request so that this can be promptly scheduled within the week.  I am concerned about the rate at which she describes it is growing.  My nurse Mickel Baas is also following up on this to ensure get scheduled within the week.

## 2021-07-22 ENCOUNTER — Other Ambulatory Visit: Payer: Self-pay | Admitting: *Deleted

## 2021-07-22 ENCOUNTER — Telehealth: Payer: Self-pay

## 2021-07-22 DIAGNOSIS — C50411 Malignant neoplasm of upper-outer quadrant of right female breast: Secondary | ICD-10-CM

## 2021-07-22 NOTE — Telephone Encounter (Signed)
Contacted pt to let her know we are working diligently to get her scheduled for MM/US per NP order. Message sent to Bary Castilla, RN, Norris Cross, RN and Jasper Riling at [facility] to get pt in ASAP. Pt is aware someone will be in contact with her to schedule

## 2021-07-28 ENCOUNTER — Ambulatory Visit
Admission: RE | Admit: 2021-07-28 | Discharge: 2021-07-28 | Disposition: A | Discharge status: Home / Self Care | Payer: BC Managed Care – PPO | Source: Ambulatory Visit | Attending: Adult Health | Admitting: Adult Health

## 2021-07-28 ENCOUNTER — Telehealth: Payer: Self-pay

## 2021-07-28 ENCOUNTER — Other Ambulatory Visit: Payer: Self-pay | Admitting: Adult Health

## 2021-07-28 ENCOUNTER — Other Ambulatory Visit: Payer: Self-pay | Admitting: Diagnostic Radiology

## 2021-07-28 ENCOUNTER — Other Ambulatory Visit (HOSPITAL_COMMUNITY)
Admission: RE | Admit: 2021-07-28 | Discharge: 2021-07-28 | Disposition: A | Discharge status: Home / Self Care | Payer: BC Managed Care – PPO | Source: Ambulatory Visit | Attending: Adult Health | Admitting: Adult Health

## 2021-07-28 ENCOUNTER — Other Ambulatory Visit: Payer: Self-pay | Admitting: *Deleted

## 2021-07-28 DIAGNOSIS — Z853 Personal history of malignant neoplasm of breast: Secondary | ICD-10-CM | POA: Diagnosis not present

## 2021-07-28 DIAGNOSIS — N6489 Other specified disorders of breast: Secondary | ICD-10-CM

## 2021-07-28 DIAGNOSIS — N6312 Unspecified lump in the right breast, upper inner quadrant: Secondary | ICD-10-CM | POA: Diagnosis not present

## 2021-07-28 DIAGNOSIS — R928 Other abnormal and inconclusive findings on diagnostic imaging of breast: Secondary | ICD-10-CM | POA: Diagnosis not present

## 2021-07-28 DIAGNOSIS — N6011 Diffuse cystic mastopathy of right breast: Secondary | ICD-10-CM | POA: Diagnosis not present

## 2021-07-28 DIAGNOSIS — N6311 Unspecified lump in the right breast, upper outer quadrant: Secondary | ICD-10-CM | POA: Diagnosis not present

## 2021-07-28 DIAGNOSIS — C50411 Malignant neoplasm of upper-outer quadrant of right female breast: Secondary | ICD-10-CM

## 2021-07-28 DIAGNOSIS — L7682 Other postprocedural complications of skin and subcutaneous tissue: Secondary | ICD-10-CM | POA: Diagnosis not present

## 2021-07-28 MED ORDER — AMOXICILLIN-POT CLAVULANATE 875-125 MG PO TABS: 1.0000 | ORAL_TABLET | Freq: Two times a day (BID) | ORAL | 0 refills | Status: DC

## 2021-07-28 MED ORDER — FLUCONAZOLE 150 MG PO TABS: 150.0000 mg | ORAL_TABLET | Freq: Every day | ORAL | 0 refills | Status: DC

## 2021-07-28 NOTE — Progress Notes (Signed)
Received call from pt requesting prescription for Diflucan related to hx of yeast infection while on antibiotics.  Verbal orders received from MD for pt to be prescribed Diflucan 150 mg tablet x2.  Pt educated to take one tablet today and repeat in 48 hours if symptoms of yeast infection are still present. Prescription sent to pharmacy on file and pt verbalized understanding.

## 2021-07-28 NOTE — Telephone Encounter (Signed)
Spoke with pt regarding breast US results. Per MD, pt needs abx for seroma. Augmentin BID sent in for pt to preferred phx. Pt states she will have seroma aspirated today at 2:45. Knows to call with any concerns.

## 2021-07-28 NOTE — Progress Notes (Signed)
Augmentin for likely breast cellulitis/infected seroma.  Patient received communication and education around antibiotics from nursing staff.

## 2021-07-30 ENCOUNTER — Other Ambulatory Visit: Payer: Self-pay | Admitting: Adult Health

## 2021-07-30 DIAGNOSIS — Z9889 Other specified postprocedural states: Secondary | ICD-10-CM

## 2021-07-30 LAB — CYTOLOGY - NON PAP

## 2021-08-11 ENCOUNTER — Other Ambulatory Visit: Payer: Self-pay | Admitting: Hematology and Oncology

## 2021-09-08 ENCOUNTER — Encounter (HOSPITAL_COMMUNITY): Payer: Self-pay

## 2021-09-16 ENCOUNTER — Telehealth: Payer: Self-pay | Admitting: Hematology and Oncology

## 2021-09-16 ENCOUNTER — Other Ambulatory Visit: Payer: Self-pay | Admitting: Hematology and Oncology

## 2021-09-16 NOTE — Telephone Encounter (Signed)
Scheduled appointment per 7/19 staff message. Patient is aware.

## 2021-09-18 DIAGNOSIS — I10 Essential (primary) hypertension: Secondary | ICD-10-CM | POA: Diagnosis not present

## 2021-09-18 DIAGNOSIS — H6983 Other specified disorders of Eustachian tube, bilateral: Secondary | ICD-10-CM | POA: Diagnosis not present

## 2021-09-18 DIAGNOSIS — H6693 Otitis media, unspecified, bilateral: Secondary | ICD-10-CM | POA: Diagnosis not present

## 2021-09-28 ENCOUNTER — Emergency Department (HOSPITAL_COMMUNITY)
Admission: EM | Admit: 2021-09-28 | Discharge: 2021-09-29 | Disposition: A | Discharge status: Home / Self Care | Payer: BC Managed Care – PPO | Attending: Emergency Medicine | Admitting: Emergency Medicine

## 2021-09-28 DIAGNOSIS — Z91040 Latex allergy status: Secondary | ICD-10-CM | POA: Insufficient documentation

## 2021-09-28 DIAGNOSIS — J181 Lobar pneumonia, unspecified organism: Secondary | ICD-10-CM | POA: Diagnosis not present

## 2021-09-28 DIAGNOSIS — I10 Essential (primary) hypertension: Secondary | ICD-10-CM | POA: Insufficient documentation

## 2021-09-28 DIAGNOSIS — R0789 Other chest pain: Secondary | ICD-10-CM | POA: Diagnosis not present

## 2021-09-28 DIAGNOSIS — Z79899 Other long term (current) drug therapy: Secondary | ICD-10-CM | POA: Insufficient documentation

## 2021-09-28 DIAGNOSIS — J189 Pneumonia, unspecified organism: Secondary | ICD-10-CM

## 2021-09-28 DIAGNOSIS — M940 Chondrocostal junction syndrome [Tietze]: Secondary | ICD-10-CM | POA: Diagnosis not present

## 2021-09-28 DIAGNOSIS — R0602 Shortness of breath: Secondary | ICD-10-CM | POA: Diagnosis not present

## 2021-09-28 DIAGNOSIS — R079 Chest pain, unspecified: Secondary | ICD-10-CM | POA: Diagnosis not present

## 2021-09-28 DIAGNOSIS — E876 Hypokalemia: Secondary | ICD-10-CM | POA: Insufficient documentation

## 2021-09-28 DIAGNOSIS — Z853 Personal history of malignant neoplasm of breast: Secondary | ICD-10-CM | POA: Diagnosis not present

## 2021-09-28 NOTE — ED Triage Notes (Signed)
Pt w centralized CP, radiation to R shoulder SHOB x1wk, worse w movement. Hx breast ca & radiation, asthma 20g L hand, BC powder PTA  98% RA  112/68 HR 100

## 2021-09-29 ENCOUNTER — Emergency Department (HOSPITAL_COMMUNITY): Payer: BC Managed Care – PPO

## 2021-09-29 DIAGNOSIS — R0602 Shortness of breath: Secondary | ICD-10-CM | POA: Diagnosis not present

## 2021-09-29 DIAGNOSIS — R079 Chest pain, unspecified: Secondary | ICD-10-CM | POA: Diagnosis not present

## 2021-09-29 LAB — BASIC METABOLIC PANEL
Anion gap: 11 (ref 5–15)
BUN: 10 mg/dL (ref 6–20)
CO2: 23 mmol/L (ref 22–32)
Calcium: 8.7 mg/dL — ABNORMAL LOW (ref 8.9–10.3)
Chloride: 104 mmol/L (ref 98–111)
Creatinine, Ser: 0.69 mg/dL (ref 0.44–1.00)
GFR, Estimated: >60 mL/min (ref >60)
Glucose, Bld: 196 mg/dL — ABNORMAL HIGH (ref 70–99)
Potassium: 2.9 mmol/L — ABNORMAL LOW (ref 3.5–5.1)
Sodium: 138 mmol/L (ref 135–145)

## 2021-09-29 LAB — CBC WITH DIFFERENTIAL/PLATELET
Abs Immature Granulocytes: 0.02 10*3/uL (ref 0.00–0.07)
Basophils Absolute: 0 10*3/uL (ref 0.0–0.1)
Basophils Relative: 0 %
Eosinophils Absolute: 0.3 10*3/uL (ref 0.0–0.5)
Eosinophils Relative: 5 %
HCT: 37.3 % (ref 36.0–46.0)
Hemoglobin: 12.3 g/dL (ref 12.0–15.0)
Immature Granulocytes: 0 %
Lymphocytes Relative: 36 %
Lymphs Abs: 2.4 10*3/uL (ref 0.7–4.0)
MCH: 29.4 pg (ref 26.0–34.0)
MCHC: 33 g/dL (ref 30.0–36.0)
MCV: 89.2 fL (ref 80.0–100.0)
Monocytes Absolute: 0.8 10*3/uL (ref 0.1–1.0)
Monocytes Relative: 13 %
Neutro Abs: 3.1 10*3/uL (ref 1.7–7.7)
Neutrophils Relative %: 46 %
Platelets: 241 10*3/uL (ref 150–400)
RBC: 4.18 MIL/uL (ref 3.87–5.11)
RDW: 15.8 % — ABNORMAL HIGH (ref 11.5–15.5)
WBC: 6.6 10*3/uL (ref 4.0–10.5)
nRBC: 0 % (ref 0.0–0.2)

## 2021-09-29 LAB — TROPONIN I (HIGH SENSITIVITY)
Troponin I (High Sensitivity): 6 ng/L (ref <18)
Troponin I (High Sensitivity): 5 ng/L (ref <18)

## 2021-09-29 MED ORDER — IOHEXOL 350 MG/ML SOLN
50.0000 mL | Freq: Once | INTRAVENOUS | Status: AC | PRN
  Administered 2021-09-29: 50 mL via INTRAVENOUS

## 2021-09-29 MED ORDER — ACETAMINOPHEN 325 MG PO TABS
650.0000 mg | ORAL_TABLET | Freq: Once | ORAL | Status: AC
  Administered 2021-09-29: 650 mg via ORAL
  Filled 2021-09-29: qty 2

## 2021-09-29 MED ORDER — IBUPROFEN 400 MG PO TABS
600.0000 mg | ORAL_TABLET | Freq: Once | ORAL | Status: AC
Start: 2021-09-29 — End: 2021-09-29
  Administered 2021-09-29: 600 mg via ORAL
  Filled 2021-09-29: qty 1

## 2021-09-29 MED ORDER — POTASSIUM CHLORIDE CRYS ER 20 MEQ PO TBCR
40.0000 meq | EXTENDED_RELEASE_TABLET | Freq: Once | ORAL | Status: AC
  Administered 2021-09-29: 40 meq via ORAL
  Filled 2021-09-29: qty 2

## 2021-09-29 MED ORDER — AMOXICILLIN 500 MG PO CAPS: 1000.0000 mg | ORAL_CAPSULE | Freq: Three times a day (TID) | ORAL | 0 refills | Status: AC

## 2021-09-29 MED ORDER — AMOXICILLIN 500 MG PO CAPS
1000.0000 mg | ORAL_CAPSULE | Freq: Once | ORAL | Status: AC
  Administered 2021-09-29: 1000 mg via ORAL
  Filled 2021-09-29: qty 2

## 2021-09-29 NOTE — ED Provider Triage Note (Signed)
  Emergency Medicine Provider Triage Evaluation Note  MRN:  [id number]  Arrival date & time: 09/29/21    Medically screening exam initiated at 12:05 AM.   CC:   Chest Pain and Shortness of Breath   HPI:  Cathy Parrish is a 51 y.o. year-old female presents to the ED with chief complaint of chest pain and shortness of breath x1 week.  History of breast cancer.  She has had prior upper extremity DVT and was taking Xarelto, but is no longer anticoagulated.  She denies cough or fever.  History provided by patient. ROS:  -As included in HPI PE:   Vitals:   09/29/21 0003  BP: 115/72  Pulse: 80  Resp: 20  Temp: 98.4 F (36.9 C)  SpO2: 95%    Non-toxic appearing No respiratory distress  MDM:  Based on signs and symptoms, PE is highest on my differential, followed by CAP or ACS. I've ordered labs in triage to expedite lab/diagnostic workup.  Patient was informed that the remainder of the evaluation will be completed by another provider, this initial triage assessment does not replace that evaluation, and the importance of remaining in the ED until their evaluation is complete.    Montine Circle, PA-C 09/29/21 0006

## 2021-09-29 NOTE — ED Provider Notes (Signed)
[facility] EMERGENCY DEPARTMENT Provider Note   CSN: [id number] Arrival date & time: 09/28/21  2347     History  Chief Complaint  Patient presents with   Chest Pain   Shortness of Breath    Cathy Parrish is a 51 y.o. female.  Patient is a 51 year old female with past medical history of prior breast cancer status postlumpectomy chemo and radiation completed about 1 year ago, hypertension presenting to the emergency department with chest pain and shortness of breath.  Patient states that she has had intermittent chest pain for the last week mostly occurring at night.  She states that she woke up last night with coughing and worsening of her chest pain and decided to come to the emergency department.  She states that she has had an associated nonproductive cough.  She denies any fevers or chills.  She states she mostly feels short of breath when she has the chest pain or on exertion.  She states that she has chronic lower extremity swelling that is no worse than usual.  She states she had decreased appetite but denies any nausea, vomiting or current diarrhea.  She denies any overlying skin changes chest.  The history is provided by the patient.  Chest Pain Associated symptoms: shortness of breath   Shortness of Breath Associated symptoms: chest pain        Home Medications Prior to Admission medications   Medication Sig Start Date End Date Taking? Authorizing Provider  amoxicillin (AMOXIL) 500 MG capsule Take 2 capsules (1,000 mg total) by mouth 3 (three) times daily for 5 days. 09/29/21 10/04/21 Yes Sky Primo, Norman Herrlich, DO  acetaminophen (TYLENOL) 650 MG CR tablet Take 1,300 mg by mouth every 8 (eight) hours as needed for pain.    [provider]  ALPRAZolam (XANAX) 0.5 MG tablet TAKE 1 TABLET(0.5 MG) BY MOUTH AT BEDTIME AS NEEDED FOR ANXIETY 04/29/21   Nicholas Lose, MD  amoxicillin-clavulanate (AUGMENTIN) 875-125 MG tablet Take 1 tablet by mouth 2 (two)  times daily. 07/28/21   Gardenia Phlegm, NP  atorvastatin (LIPITOR) 20 MG tablet Take 1 tablet (20 mg total) by mouth daily. 12/10/18   Forrest Moron, MD  Cholecalciferol (VITAMIN D) 125 MCG (5000 UT) CAPS Take 5,000 Units by mouth daily.    [provider]  dicyclomine (BENTYL) 10 MG capsule TAKE 1 CAPSULE(10 MG) BY MOUTH THREE TIMES DAILY AS NEEDED FOR SPASMS 12/04/20   Nicholas Lose, MD  fluconazole (DIFLUCAN) 150 MG tablet Take 1 tablet (150 mg total) by mouth daily. 07/28/21   Nicholas Lose, MD  furosemide (LASIX) 20 MG tablet TAKE 1 TABLET(20 MG) BY MOUTH DAILY 02/17/21   Nicholas Lose, MD  gabapentin (NEURONTIN) 300 MG capsule TAKE 1 CAPSULE(300 MG) BY MOUTH AT BEDTIME 08/11/21   Nicholas Lose, MD  ibuprofen (ADVIL) 800 MG tablet Take 800 mg by mouth every 8 (eight) hours as needed for moderate pain. 03/20/20   [provider]  irbesartan-hydrochlorothiazide (AVALIDE) 150-12.5 MG tablet Take 1 tablet by mouth daily. 07/20/19   Forrest Moron, MD  loratadine (CLARITIN) 10 MG tablet Take 10 mg by mouth daily.    [provider]  potassium chloride SA (KLOR-CON M) 20 MEQ tablet Take 1 tablet (20 mEq total) by mouth 2 (two) times daily. 03/06/21   Nicholas Lose, MD  SUMAtriptan (IMITREX) 50 MG tablet Take one tablet by mouth at the first sign of headache. May repeat in 2 hours if headache persists or  recurs. Patient taking differently: Take 50 mg by mouth every 2 (two) hours as needed. May repeat in 2 hours if headache persists or recurs. 09/04/18   Forrest Moron, MD  tamoxifen (NOLVADEX) 20 MG tablet TAKE 1 TABLET(20 MG) BY MOUTH DAILY 09/16/21   Nicholas Lose, MD  tetrahydrozoline 0.05 % ophthalmic solution Place 1 drop into both eyes daily as needed (dry/irritated eyes).    [provider]  prochlorperazine (COMPAZINE) 10 MG tablet TAKE 1 TABLET(10 MG) BY MOUTH EVERY 6 HOURS AS NEEDED FOR NAUSEA Patient not taking: Reported on 07/23/2020 05/13/20 09/30/20   Nicholas Lose, MD      Allergies    Latex    Review of Systems   Review of Systems  Respiratory:  Positive for shortness of breath.   Cardiovascular:  Positive for chest pain.    Physical Exam Updated Vital Signs BP 111/77   Pulse 62   Temp 98.7 F (37.1 C) (Oral)   Resp 15   SpO2 100%  Physical Exam Vitals and nursing note reviewed.  Constitutional:      General: She is not in acute distress.    Appearance: She is well-developed.  HENT:     Head: Normocephalic and atraumatic.  Eyes:     Extraocular Movements: Extraocular movements intact.  Cardiovascular:     Rate and Rhythm: Normal rate and regular rhythm.     Heart sounds: Normal heart sounds.  Pulmonary:     Effort: Pulmonary effort is normal.     Breath sounds: Normal breath sounds.  Chest:     Chest wall: Tenderness (Right sided chest wall tenderness, no overlying skin changes) present.  Abdominal:     Palpations: Abdomen is soft.  Musculoskeletal:        General: Normal range of motion.     Cervical back: Normal range of motion and neck supple.  Skin:    General: Skin is warm and dry.  Neurological:     General: No focal deficit present.     Mental Status: She is alert and oriented to person, place, and time.  Psychiatric:        Mood and Affect: Mood normal.        Behavior: Behavior normal.     ED Results / Procedures / Treatments   Labs (all labs ordered are listed, but only abnormal results are displayed) Labs Reviewed  CBC WITH DIFFERENTIAL/PLATELET - Abnormal; Notable for the following components:      Result Value   RDW 15.8 (*)    All other components within normal limits  BASIC METABOLIC PANEL - Abnormal; Notable for the following components:   Potassium 2.9 (*)    Glucose, Bld 196 (*)    Calcium 8.7 (*)    All other components within normal limits  TROPONIN I (HIGH SENSITIVITY)  TROPONIN I (HIGH SENSITIVITY)    EKG EKG Interpretation  Date/Time:  Tuesday September 29 2021 00:08:22  EDT Ventricular Rate:  74 PR Interval:  178 QRS Duration: 66 QT Interval:  388 QTC Calculation: 430 R Axis:   27 Text Interpretation: Normal sinus rhythm T wave inversions in V2-V3 new from prior Abnormal ECG When compared with ECG of 28-Dec-2019 13:43, PREVIOUS ECG IS PRESENT New T wave inversions, otherwise unchanged from prior EKG Confirmed by Oneal Deputy [phone]) on 09/29/2021 10:45:35 AM  Radiology CT Angio Chest PE W and/or Wo Contrast  Result Date: 09/29/2021 CLINICAL DATA:  Chest pain, shortness of breath. Pulmonary embolism suspected. [facility] body  [facility] treatment proper large guided 1 bulk is sciatica subtle wound basically has symptoms treatment banana bag drug overdose local battery navicular about Charissa Bash EXAM: CT ANGIOGRAPHY CHEST WITH CONTRAST TECHNIQUE: Multidetector CT imaging of the chest was performed using the standard protocol during bolus administration of intravenous contrast. Multiplanar CT image reconstructions and MIPs were obtained to evaluate the vascular anatomy. RADIATION DOSE REDUCTION: This exam was performed according to the departmental dose-optimization program which includes automated exposure control, adjustment of the mA and/or kV according to patient size and/or use of iterative reconstruction technique. CONTRAST:  20m OMNIPAQUE IOHEXOL 350 MG/ML SOLN COMPARISON:  None Available. FINDINGS: Cardiovascular: Satisfactory opacification of the pulmonary arteries to the segmental level. No evidence of pulmonary embolism. Normal heart size. No pericardial effusion. Mediastinum/Nodes: No enlarged mediastinal, hilar, or axillary lymph nodes. Thyroid gland, trachea, and esophagus demonstrate no significant findings. Lungs/Pleura: Reticulonodular opacities in the superolateral aspect of the right upper lobe. There also atelectasis and ground-glass opacities in the medial aspect of the right middle lobe. No pleural effusion or pneumothorax.  Upper Abdomen: No acute abnormality. 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 No acute osseous abnormality. No suspicious osseous lesion. Soft tissue density mass with multiple surgical clips in the right breast. Review of the MIP images confirms the above findings. IMPRESSION: 1.  No evidence of pulmonary embolism. 2. Reticulonodular opacities in the superolateral aspect of the right upper lobe and in the medial aspect of the right middle lobe concerning for infectious/inflammatory process including pneumonia. No focal consolidation. No pleural effusion or pneumothorax. 3. Complex fluid collection in the right breast with multiple surrounding surgical clips suggesting postsurgical changes. Further evaluation with sonogram is recommended. Electronically Signed   By: IKeane PoliceD.O.   On: 09/29/2021 09:49   DG Chest 2 View  Result Date: 09/29/2021 CLINICAL DATA:  Chest pain. EXAM: CHEST - 2 VIEW COMPARISON:  Chest radiograph dated 01/02/2020. FINDINGS: No focal consolidation, pleural effusion, or pneumothorax. The cardiac silhouette is within normal limits. No acute osseous pathology. Multiple surgical clips in the right anterior chest wall/breast. IMPRESSION: No active cardiopulmonary disease. Electronically Signed   By: AAnner CreteM.D.   On: 09/29/2021 00:36    Procedures Procedures    Medications Ordered in ED Medications  amoxicillin (AMOXIL) capsule 1,000 mg (has no administration in time range)  ibuprofen (ADVIL) tablet 600 mg (has no administration in time range)  acetaminophen (TYLENOL) tablet 650 mg (has no administration in time range)  iohexol (OMNIPAQUE) 350 MG/ML injection 50 mL (50 mLs Intravenous Contrast Given 09/29/21 0914)  potassium chloride SA (KLOR-CON M) CR tablet 40 mEq (40 mEq Oral Given 09/29/21 1029)    ED Course/ Medical Decision Making/ A&P Clinical Course as of 09/29/21 1049  Tue Sep 29, 2021  1009 CTPE negative for PE but does show  concern for possible pneumonia as well as complex fluid collection in the breast. Labs interpreted by myself significant for hypokalemia and will receive repletion. [VK]  1045 Patient CT PE showed fluid collection in the left breast.  The patient states that this is known and has been drained a few months ago and is not infected at that time.  She denies any redness or warmth to her right breast. [VK]    Clinical Course User Index [VK] KOttie Glazier DO  Medical Decision Making Patient is a 51 year old female with a past medical history of previous breast cancer, treatment completed 1 year ago, and hypertension presenting to the emergency department with chest pain, cough and shortness of breath.  Patient was initially evaluated by triage provider and had labs, EKG, chest x-ray and CT PE performed to evaluate for ACS, arrhythmia, anemia, pneumonia, PE, pulmonary effusion as possible causes of her chest pain and shortness of breath.  EKG and labs interpreted and reviewed by myself showed no acute ischemic changes, labs show hypokalemia to 2.9 and otherwise no acute findings.  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  She states this is regularly followed by her oncologist.  She will be given additional potassium supplement here today.  CT PE scan was negative for PE but showed concern for early pneumonia and she will be treated with amoxicillin.  She does have chest wall tenderness to palpation, concerning for additional costochondritis and will be given Tylenol and Motrin for pain.  The patient is stable for discharge home with outpatient primary care follow-up and is amenable and agreeable to plan.  She was given strict return precautions.  Amount and/or Complexity of Data Reviewed External Data Reviewed: labs.    Details: Prior labs show potassium around 2.9-3.1 Labs:  Decision-making details documented in ED  Course. Radiology:  Decision-making details documented in ED Course. ECG/medicine tests:  Decision-making details documented in ED Course.  Risk OTC drugs. Prescription drug management.          Final Clinical Impression(s) / ED Diagnoses Final diagnoses:  Costochondritis, acute  Community acquired pneumonia of right lung, unspecified part of lung  Hypokalemia    Rx / DC Orders ED Discharge Orders          Ordered    amoxicillin (AMOXIL) 500 MG capsule  3 times daily        09/29/21 [place], Preston K, DO 09/29/21 1049

## 2021-09-29 NOTE — ED Notes (Signed)
Patient transported to CT 

## 2021-09-29 NOTE — Discharge Instructions (Addendum)
You were seen in the emergency department for your chest pain and shortness of breath.  Your CT scan showed pneumonia and this is treated with antibiotics.  Please complete your antibiotics as prescribed.  Your chest wall was also significantly tender so you may have inflammation of the muscles of your chest wall.  You can take Tylenol or Motrin as needed for pain.  Your labs showed that your potassium level today was low at 2.9.  We gave you extra repletion yesterday and you should have your potassium level rechecked by your primary doctor to determine if you need additional supplementation.  You should follow-up with your primary doctor in about 1 week to also have your chest pain and shortness of breath reassessed.  You should return to the emergency department if your pain gets significantly worse, you are vomiting and cannot keep down your medication, you have worsening shortness of breath, you have fevers, or if you have any other new or concerning symptoms.

## 2021-09-29 NOTE — ED Notes (Signed)
Discharge instructions reviewed with patient. Patient denies any questions or concerns. Pt ambulatory out of ED.

## 2021-10-09 ENCOUNTER — Telehealth: Payer: Self-pay | Admitting: *Deleted

## 2021-10-09 DIAGNOSIS — N6489 Other specified disorders of breast: Secondary | ICD-10-CM | POA: Diagnosis not present

## 2021-10-09 DIAGNOSIS — J189 Pneumonia, unspecified organism: Secondary | ICD-10-CM | POA: Diagnosis not present

## 2021-10-09 DIAGNOSIS — I10 Essential (primary) hypertension: Secondary | ICD-10-CM | POA: Diagnosis not present

## 2021-10-09 DIAGNOSIS — E876 Hypokalemia: Secondary | ICD-10-CM | POA: Diagnosis not present

## 2021-10-09 NOTE — Telephone Encounter (Signed)
Received call from pt with complaint of ongoing right breast pain and swelling. Pt states her PCP has set her up at the breast center for an aspiration and pt is requesting a referral be placed for a repeat aspiration of the right breast.  Per MD pt needing to contact breast surgeon for further evaluation and treatment to identify underlying cause of ongoing breast swelling.  Pt verbalized understanding and states she will reach out to that office.

## 2021-10-16 DIAGNOSIS — Z170 Estrogen receptor positive status [ER+]: Secondary | ICD-10-CM | POA: Diagnosis not present

## 2021-10-16 DIAGNOSIS — N6489 Other specified disorders of breast: Secondary | ICD-10-CM | POA: Diagnosis not present

## 2021-10-16 DIAGNOSIS — C50411 Malignant neoplasm of upper-outer quadrant of right female breast: Secondary | ICD-10-CM | POA: Diagnosis not present

## 2021-10-30 DIAGNOSIS — J069 Acute upper respiratory infection, unspecified: Secondary | ICD-10-CM | POA: Diagnosis not present

## 2021-10-30 DIAGNOSIS — R059 Cough, unspecified: Secondary | ICD-10-CM | POA: Diagnosis not present

## 2021-11-01 ENCOUNTER — Other Ambulatory Visit: Payer: Self-pay | Admitting: Hematology and Oncology

## 2021-11-06 IMAGING — DX DG SHOULDER 2+V*R*
3 series · 3 of 3 positions shown · non-contrast
Comparison: None

CLINICAL DATA: RIGHT shoulder pain, history breast cancer

EXAM:
RIGHT SHOULDER - 2+ VIEW

[shoulder grashey]
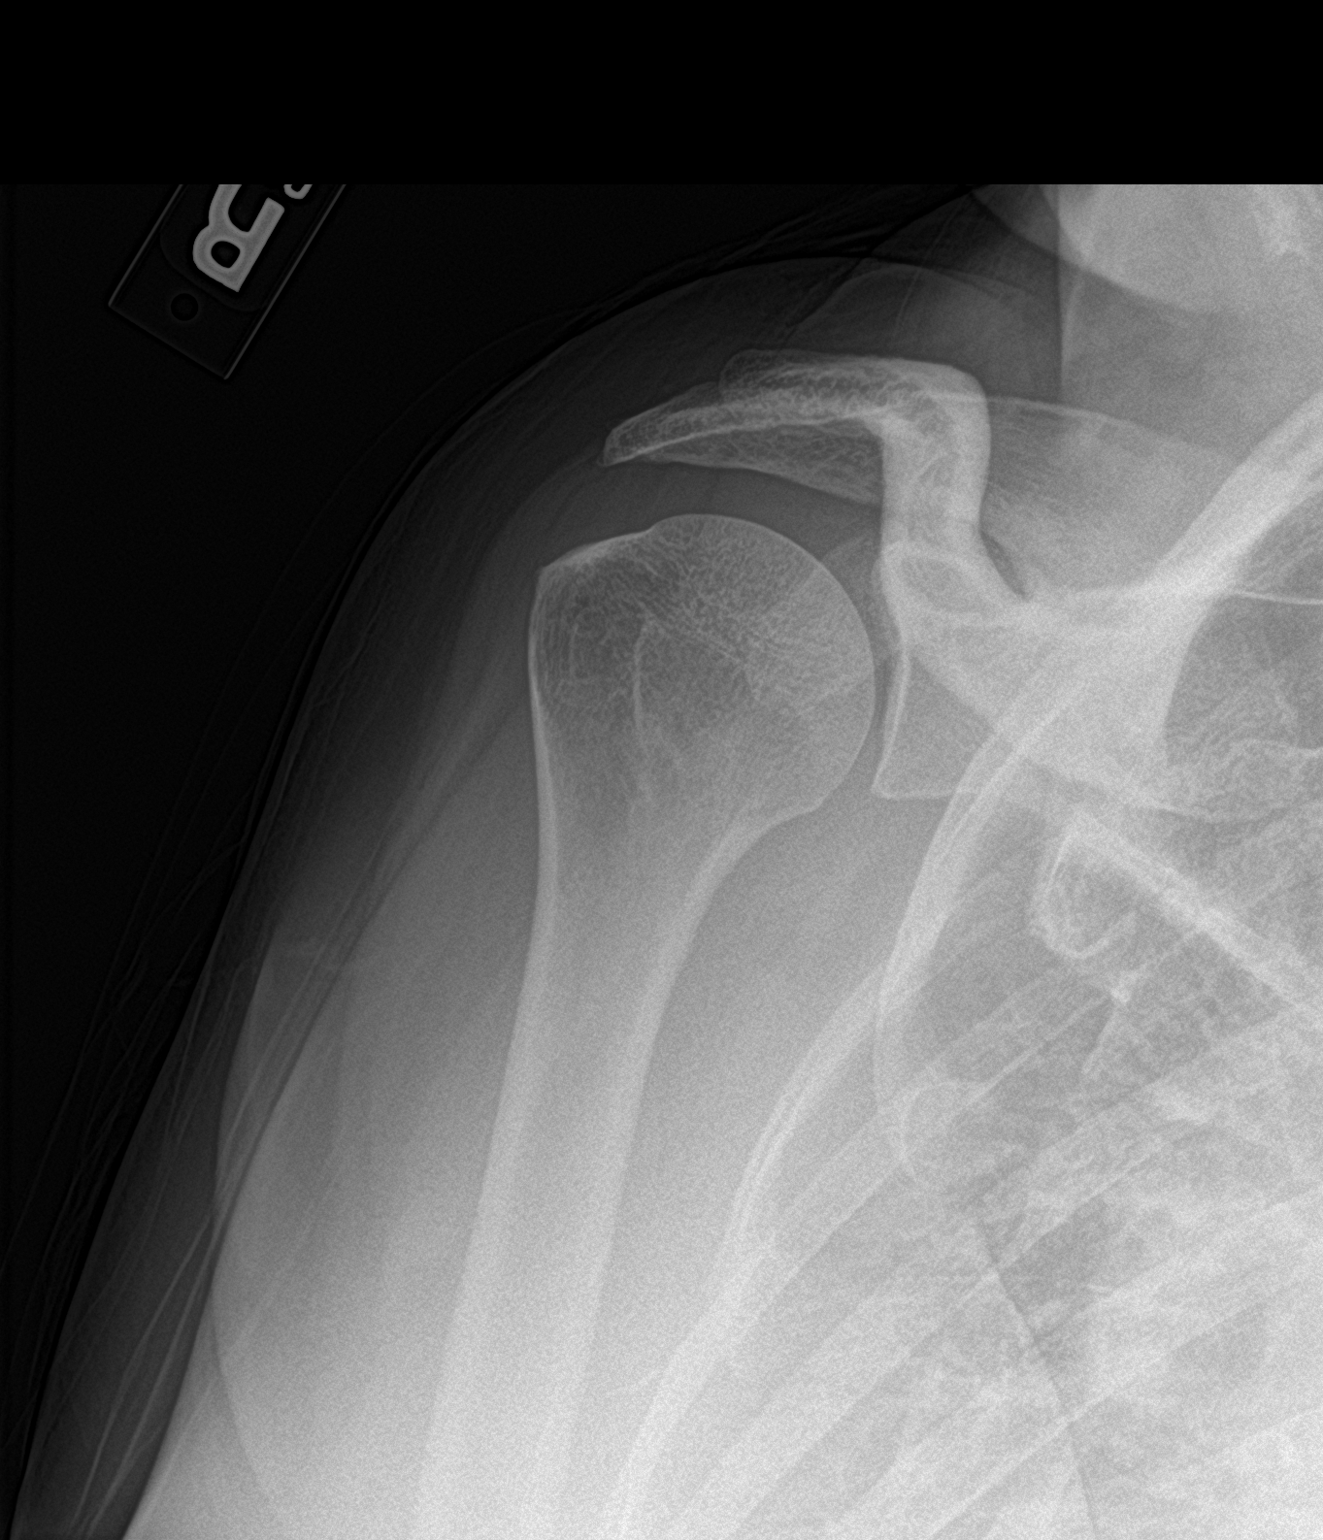

[shoulder y view]
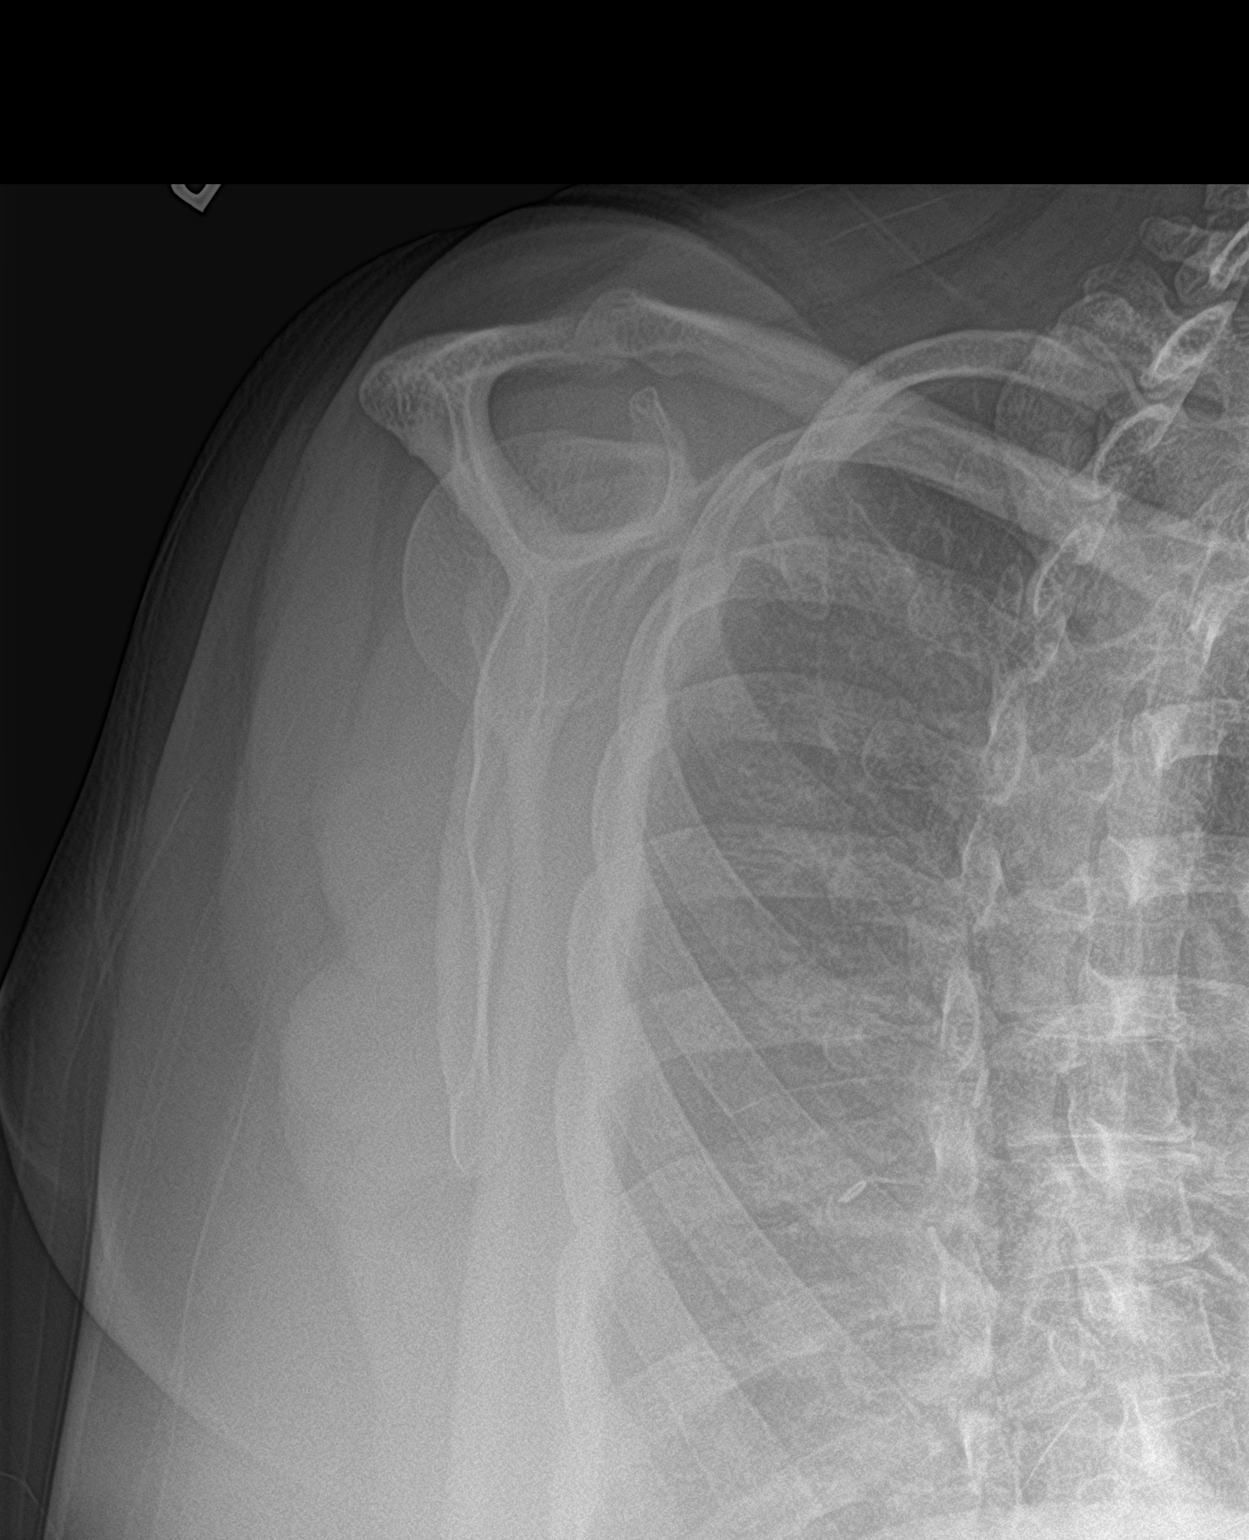

[shoulder axillary]
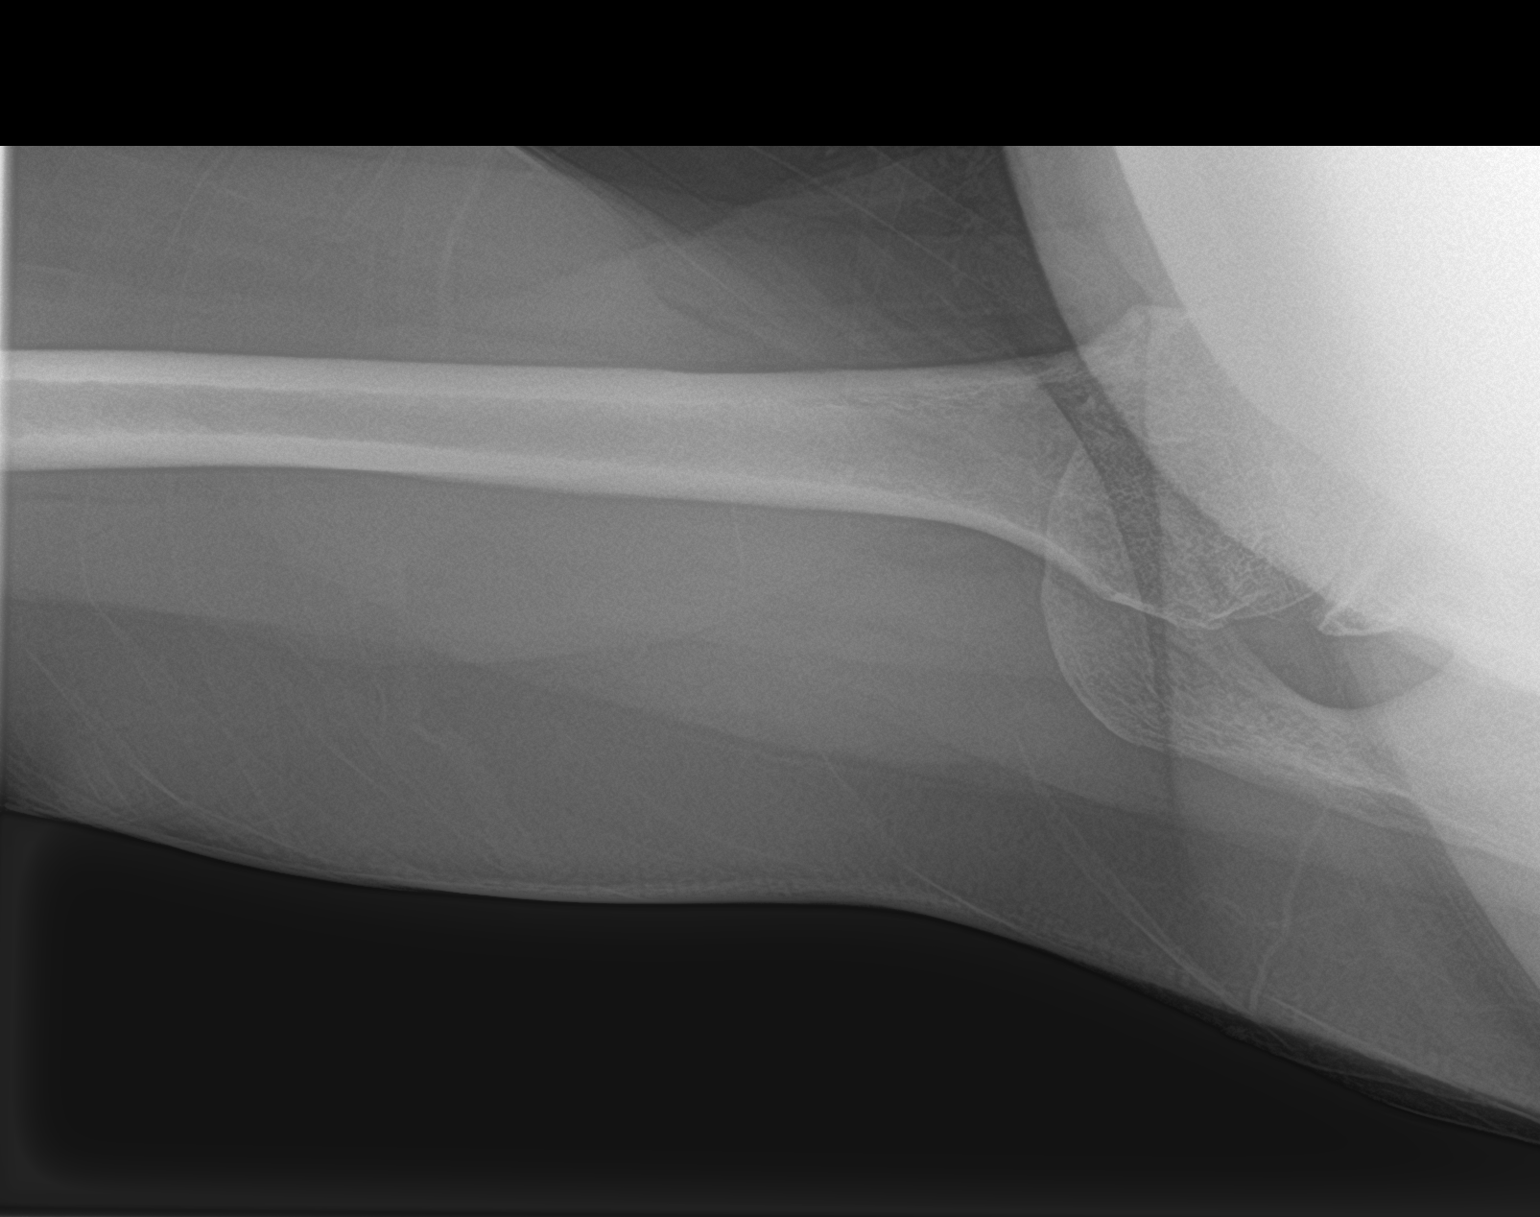

[3 of 3 positions shown; findings below may reference images not displayed]

FINDINGS: Osseous mineralization normal.

AC joint alignment normal.

No acute fracture, dislocation, or bone destruction.
IMPRESSION: No acute abnormalities.

## 2021-11-13 DIAGNOSIS — I10 Essential (primary) hypertension: Secondary | ICD-10-CM | POA: Diagnosis not present

## 2021-11-13 DIAGNOSIS — E785 Hyperlipidemia, unspecified: Secondary | ICD-10-CM | POA: Diagnosis not present

## 2021-11-23 NOTE — Therapy (Signed)
OUTPATIENT PHYSICAL THERAPY ONCOLOGY EVALUATION  Patient Name: Cathy Parrish MRN: [id number] DOB:1970-06-27, 51 y.o., female Today's Date: 11/24/2021   PT End of Session - 11/24/21 0915     Visit Number 1    Number of Visits 9    Date for PT Re-Evaluation 12/22/21    PT Start Time 0805    PT Stop Time 0908    PT Time Calculation (min) 63 min    Activity Tolerance Patient tolerated treatment well    Behavior During Therapy [facility] for tasks assessed/performed             Past Medical History:  Diagnosis Date   Anxiety    Arthritis    shoulders   Asthma    Breast cancer ([place]) 11/2019   Depression    Diabetes mellitus without complication ([place])    diet controlled   Family history of breast cancer    Family history of colon cancer    Family history of liver cancer    Headache    Hypertension    Personal history of chemotherapy    Personal history of radiation therapy    Stroke ([place])    TIA no deficits, 6 years ago per pt   Past Surgical History:  Procedure Laterality Date   AXILLARY SENTINEL NODE BIOPSY Right 06/24/2020   Procedure: RIGHT AXILLARY SENTINEL LYMPH NODE BIOPSY;  Surgeon: Stark Klein, MD;  Location: [place];  Service: General;  Laterality: Right;   BREAST BIOPSY Right    BREAST LUMPECTOMY WITH RADIOACTIVE SEED AND SENTINEL LYMPH NODE BIOPSY Right 06/24/2020   Procedure: RIGHT BREAST LUMPECTOMY WITH RADIOACTIVE SEED X 2;  Surgeon: Stark Klein, MD;  Location: [place];  Service: General;  Laterality: Right;   COLONOSCOPY  07/13/2011   Procedure: COLONOSCOPY;  Surgeon: Gatha Mayer, MD;  Location: [place];  Service: Endoscopy;  Laterality: N/A;   DILATION AND CURETTAGE OF UTERUS  abortion   DILITATION & CURRETTAGE/HYSTROSCOPY WITH NOVASURE ABLATION N/A 09/28/2012   Procedure: DILATATION & CURETTAGE/HYSTEROSCOPY WITH NOVASURE ABLATION; And Resectoscope;  Surgeon: Marvene Staff, MD;  Location: [facility];  Service: Gynecology;  Laterality: N/A;    ESOPHAGOGASTRODUODENOSCOPY  07/13/2011   Procedure: ESOPHAGOGASTRODUODENOSCOPY (EGD);  Surgeon: Gatha Mayer, MD;  Location: [facility] ENDOSCOPY;  Service: Endoscopy;  Laterality: N/A;   PORT-A-CATH REMOVAL Left 06/24/2020   Procedure: REMOVAL PORT-A-CATH;  Surgeon: Stark Klein, MD;  Location: [place];  Service: General;  Laterality: Left;   PORTACATH PLACEMENT N/A 01/02/2020   Procedure: INSERTION PORT-A-CATH;  Surgeon: Stark Klein, MD;  Location: [place];  Service: General;  Laterality: N/A;   RADIOACTIVE SEED GUIDED EXCISIONAL BREAST BIOPSY Left 06/24/2020   Procedure: RADIOACTIVE SEED GUIDED EXCISIONAL LEFT BREAST BIOPSY;  Surgeon: Stark Klein, MD;  Location: [place];  Service: General;  Laterality: Left;   SENTINEL NODE BIOPSY Right 06/24/2020   Procedure: RADIOACTIVE SEED LOCALIZED RIGHT LYMPH NODE BIOPSY;  Surgeon: Stark Klein, MD;  Location: [place];  Service: General;  Laterality: Right;   TONSILLECTOMY     Patient Active Problem List   Diagnosis Date Noted   Chemotherapy-induced peripheral neuropathy ([place]) 11/02/2020   Genetic testing 02/28/2020   Port-A-Cath in place 01/17/2020   Family history of breast cancer    Family history of colon cancer    Family history of liver cancer    Malignant neoplasm of upper-outer quadrant of right breast in female, estrogen receptor positive ([place]) 12/19/2019   B12 deficiency 07/12/2011   NSAID long-term  use-[place] 07/12/2011   Weakness 07/11/2011   Iron deficiency anemia 07/11/2011   Hypertension     PCP: Tereasa Coop, PA-C  REFERRING PROVIDER: Stark Klein, MD   REFERRING DIAG: [phone] (ICD-10-CM) - Other specified disorders of breast C50.411 (ICD-10-CM) - Malignant neoplasm of upper-outer quadrant of right female breast Z17.0 (ICD-10-CM) - Estrogen receptor positive status (ER+)   THERAPY DIAG:  Lymphedema, not elsewhere classified  Stiffness of right shoulder, not elsewhere classified  Abnormal posture  Malignant  neoplasm of upper-outer quadrant of right female breast, unspecified estrogen receptor status ([place])  ONSET DATE: April 2023  Rationale for Evaluation and Treatment Rehabilitation  SUBJECTIVE                                                                                                                                                                                           SUBJECTIVE STATEMENT: I have been having quite a bit of swelling in my R breast and it becomes painful. I have aspirated twice. My doctor referred me to try some therapy. I walk and stretch and go to the gym.  PERTINENT HISTORY:  Diagnosed with right breast cancer October 202 it was weakly ER positive, PR negative and HER2 negative.  Patient underwent neoadjuvant chemotherapy. S/P R breast lumpectomy and sentinel lymph node (0/2) June 24, 2020. The left side had a seed localized excisional biopsy. There was additional area on the right that also was excised for biopsy. She had no residual cancer and the excisional biopsies were benign. Following this she had adjuvant radiation and started on tamoxifen. Had an episode that was significant for infected seroma with cellulitis on the right in May 2023. She had imaging and aspiration as well as oral antibiotics. Recently right breast was ultrasounded and aspirated by the PA. Only 22 cc were aspirated.  PAIN:  Are you having pain? Yes NPRS scale: 5/10 Pain location: R breast Pain orientation: Right  PAIN TYPE: sharp and constant Pain description: constant and sharp Aggravating factors: nothing Relieving factors: hot shower, warm rag on breast  PRECAUTIONS: Other: breast lymphedema  WEIGHT BEARING RESTRICTIONS No  FALLS:  Has patient fallen in last 6 months? No  LIVING ENVIRONMENT: Lives with: lives with their son Lives in: House/apartment Stairs: No;  Has following equipment at home: Single point cane  OCCUPATION: full time - billing specialist for [employer]: exercise 5 days/wk, 3 days/wk goes to gym and does treadmill and elliptical; does stretching  HAND DOMINANCE : right   PRIOR LEVEL OF FUNCTION: Independent  PATIENT GOALS avoid fluid from coming back, decrease swelling   OBJECTIVE  COGNITION:  Overall cognitive status: Within functional limits for tasks assessed   PALPATION: Area of fibrosis in area of seroma  OBSERVATIONS / OTHER ASSESSMENTS: R breast larger than left with increased pore size noted and seroma palpable superior to areola  POSTURE: forward head and rounded  UPPER EXTREMITY AROM/PROM:  A/PROM RIGHT   eval   Shoulder extension 75  Shoulder flexion 130  Shoulder abduction 120  Shoulder internal rotation 68  Shoulder external rotation 72    (Blank rows = not tested)  A/PROM LEFT   eval  Shoulder extension 80  Shoulder flexion 170  Shoulder abduction 179  Shoulder internal rotation 75  Shoulder external rotation 87    (Blank rows = not tested)    LYMPHEDEMA ASSESSMENTS:   SURGERY TYPE/DATE: 06/24/20 R breast lumpectomy and SLNB  NUMBER OF LYMPH NODES REMOVED: 0/2  CHEMOTHERAPY: neoadjuvant completed  RADIATION:completed  HORMONE TREATMENT: on tamoxifen  INFECTIONS: none  LYMPHEDEMA ASSESSMENTS:   LANDMARK RIGHT  eval  10 cm proximal to olecranon process 39  Olecranon process 30  10 cm proximal to ulnar styloid process 22  Just proximal to ulnar styloid process 17  Across hand at thumb web space 20.3  At base of 2nd digit 6.8  (Blank rows = not tested)  LANDMARK LEFT  eval  10 cm proximal to olecranon process 38.5  Olecranon process 28.6  10 cm proximal to ulnar styloid process 21.7  Just proximal to ulnar styloid process 16.6  Across hand at thumb web space 19.4  At base of 2nd digit 6.5  (Blank rows = not tested)     BREAST COMPLAINTS SURVEY: 75   TODAY'S TREATMENT  11/24/21: In supine with head on 2 pillows as follows: short neck, 5 diaphragmatic  breaths, left axillary nodes and establishment of interaxillary pathway, R inguinal nodes and establishment of axillo inguinal pathway, R breast moving fluid towards pathways and then retracing all steps while educating pt throughout on anatomy and physiology of the lymphatic system and basic MLD principles and techniques  Therapeutic exercise: Instructed pt in supine dowel exercises in direction of flexion and abduction with 5 sec holds while providing verbal and tactile cues for pt to perform correctly  PATIENT EDUCATION:  Education details: how to obtain compression bra, anatomy and physiology of the lymphatic system, lymphedema risk reduction handout, compression sleeve and glove and need for those Person educated: Patient Education method: Explanation and Handouts Education comprehension: verbalized understanding   HOME EXERCISE PROGRAM: Supine dowel abduction and flexion  ASSESSMENT:  CLINICAL IMPRESSION: Patient is a 51 y.o. female who was seen today for physical therapy evaluation and treatment for right breast lymphedema and decreased right shoulder ROM. Pt reports her right breast began swelling in April of this year and her arm also occasionally swells. She reports the arm swelling about once a month and her rings feel more tight at that time and she can feel the swelling in her arm. Pt would benefit from a compression bra for management of breast swelling and compression sleeve and glove to wear when exercising and when her arm feels swollen. She has significantly decreased R shoulder ROM compared to L. She would benefit from skilled PT services to improve R shoulder ROM and decrease R breast lymphedema and assist pt with obtaining appropriate compression garments.     OBJECTIVE IMPAIRMENTS decreased knowledge of condition, decreased knowledge of use of DME, decreased ROM, increased edema, increased fascial restrictions, postural dysfunction, and pain.   ACTIVITY LIMITATIONS lifting  and reach over head  PARTICIPATION LIMITATIONS:  none  PERSONAL FACTORS Time since onset of injury/illness/exacerbation are also affecting patient's functional outcome.   REHAB POTENTIAL: Good  CLINICAL DECISION MAKING: Stable/uncomplicated  EVALUATION COMPLEXITY: Low  GOALS: Goals reviewed with patient? Yes  SHORT TERM GOALS=LONG TERM GOALS  Target date: 12/22/2021    Pt will be independent in self MLD for long term management of lymphedema.  Baseline: Goal status: INITIAL  2.  Pt will demonstrate 160 degrees of R shoulder flexion to allow pt to reach overhead.  Baseline: 130 Goal status: INITIAL  3.  Pt will demonstrate 160 degrees of R shoulder abduction to allow her to reach out to the side. Baseline: 120 Goal status: INITIAL  4.  Pt will obtain a compression bra for long term management of lymphedema.  Baseline:  Goal status: INITIAL  5.  Pt will obtain a compression sleeve and glove to wear for management of R UE edema.  Baseline:  Goal status: INITIAL  6.  Pt will report a 75% improvement in pain in R breast to allow improved comfort. Baseline:  Goal status: INITIAL  PLAN: PT FREQUENCY: 2x/week  PT DURATION: 4 weeks  PLANNED INTERVENTIONS: Therapeutic exercises, Therapeutic activity, Patient/Family education, Self Care, Joint mobilization, Orthotic/Fit training, Manual lymph drainage, Compression bandaging, scar mobilization, Taping, Vasopneumatic device, and Manual therapy  PLAN FOR NEXT SESSION: continue MLD to R breast, instruct pt and issue handout, PROM to R shoulder, how are dowel exercises? Measure for compression sleeve - have benefits come back from [facility]?   [facility] [place], PT 11/24/2021, 9:33 AM

## 2021-11-24 ENCOUNTER — Ambulatory Visit: Payer: BC Managed Care – PPO | Attending: General Surgery | Admitting: Physical Therapy

## 2021-11-24 ENCOUNTER — Other Ambulatory Visit: Payer: Self-pay

## 2021-11-24 DIAGNOSIS — I890 Lymphedema, not elsewhere classified: Secondary | ICD-10-CM | POA: Insufficient documentation

## 2021-11-24 DIAGNOSIS — M25611 Stiffness of right shoulder, not elsewhere classified: Secondary | ICD-10-CM | POA: Diagnosis not present

## 2021-11-24 DIAGNOSIS — R293 Abnormal posture: Secondary | ICD-10-CM | POA: Insufficient documentation

## 2021-11-24 DIAGNOSIS — C50411 Malignant neoplasm of upper-outer quadrant of right female breast: Secondary | ICD-10-CM | POA: Insufficient documentation

## 2021-11-26 DIAGNOSIS — Z9889 Other specified postprocedural states: Secondary | ICD-10-CM | POA: Diagnosis not present

## 2021-11-26 DIAGNOSIS — Z01419 Encounter for gynecological examination (general) (routine) without abnormal findings: Secondary | ICD-10-CM | POA: Diagnosis not present

## 2021-11-26 DIAGNOSIS — I10 Essential (primary) hypertension: Secondary | ICD-10-CM | POA: Diagnosis not present

## 2021-11-26 DIAGNOSIS — R87610 Atypical squamous cells of undetermined significance on cytologic smear of cervix (ASC-US): Secondary | ICD-10-CM | POA: Diagnosis not present

## 2021-11-26 DIAGNOSIS — C50911 Malignant neoplasm of unspecified site of right female breast: Secondary | ICD-10-CM | POA: Diagnosis not present

## 2021-11-27 ENCOUNTER — Other Ambulatory Visit: Payer: Self-pay | Admitting: Adult Health

## 2021-11-27 ENCOUNTER — Ambulatory Visit
Admission: RE | Admit: 2021-11-27 | Discharge: 2021-11-27 | Disposition: A | Discharge status: Home / Self Care | Payer: BC Managed Care – PPO | Source: Ambulatory Visit | Attending: Adult Health | Admitting: Adult Health

## 2021-11-27 DIAGNOSIS — Z853 Personal history of malignant neoplasm of breast: Secondary | ICD-10-CM | POA: Diagnosis not present

## 2021-11-27 DIAGNOSIS — Z9889 Other specified postprocedural states: Secondary | ICD-10-CM

## 2021-11-27 DIAGNOSIS — N6489 Other specified disorders of breast: Secondary | ICD-10-CM | POA: Diagnosis not present

## 2021-11-27 DIAGNOSIS — R928 Other abnormal and inconclusive findings on diagnostic imaging of breast: Secondary | ICD-10-CM | POA: Diagnosis not present

## 2021-12-01 ENCOUNTER — Ambulatory Visit: Payer: BC Managed Care – PPO | Admitting: Physical Therapy

## 2021-12-02 IMAGING — MG DIGITAL DIAGNOSTIC BILAT W/ TOMO W/ CAD
6 of 9 series · 6 of 25 positions shown · non-contrast
Comparison: Previous exam(s).

CLINICAL DATA: History of a right lumpectomy for breast carcinoma,
performed on 06/24/2020. Patient also underwent a benign left breast
excision in late 3639 for a complex sclerosing lesion. She has had 2
additional benign biopsies L1 on the right and another on the left.
She underwent neoadjuvant chemotherapy for her right breast
malignancy and adjuvant radiation therapy, which she has recently
completed.

EXAM:
DIGITAL DIAGNOSTIC BILATERAL MAMMOGRAM WITH TOMOSYNTHESIS AND CAD
TECHNIQUE: Bilateral digital diagnostic mammography and breast tomosynthesis
was performed. The images were evaluated with computer-aided
detection.

[R MLO]
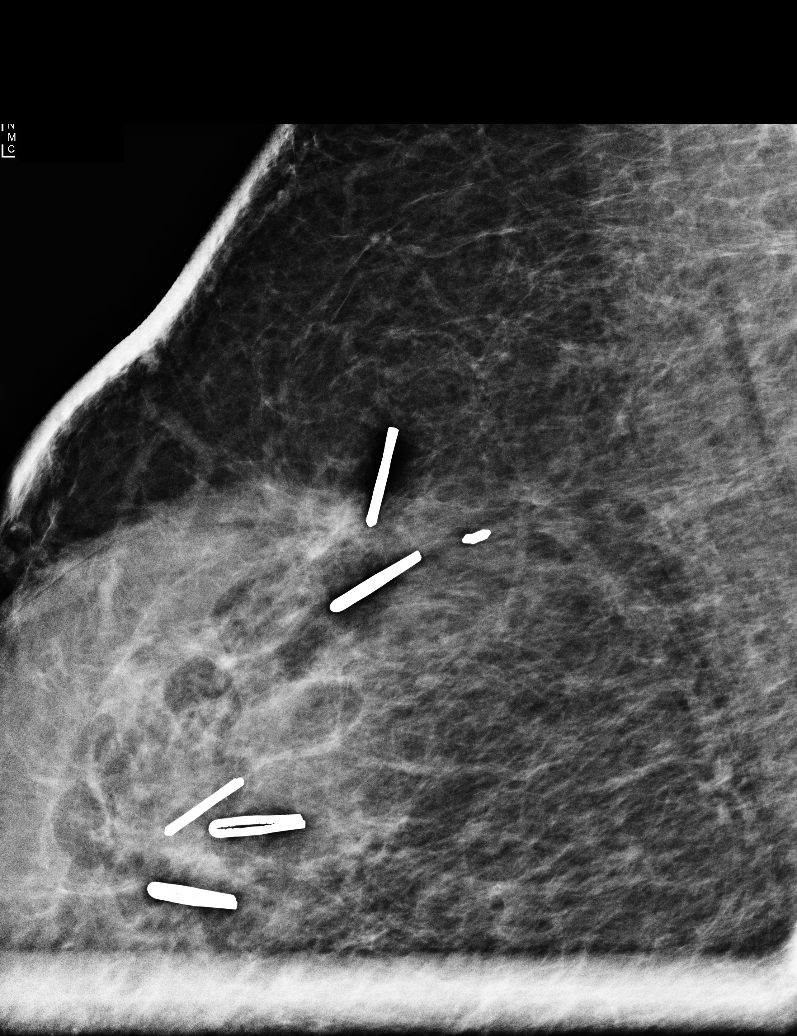

[R CC synth-2D]
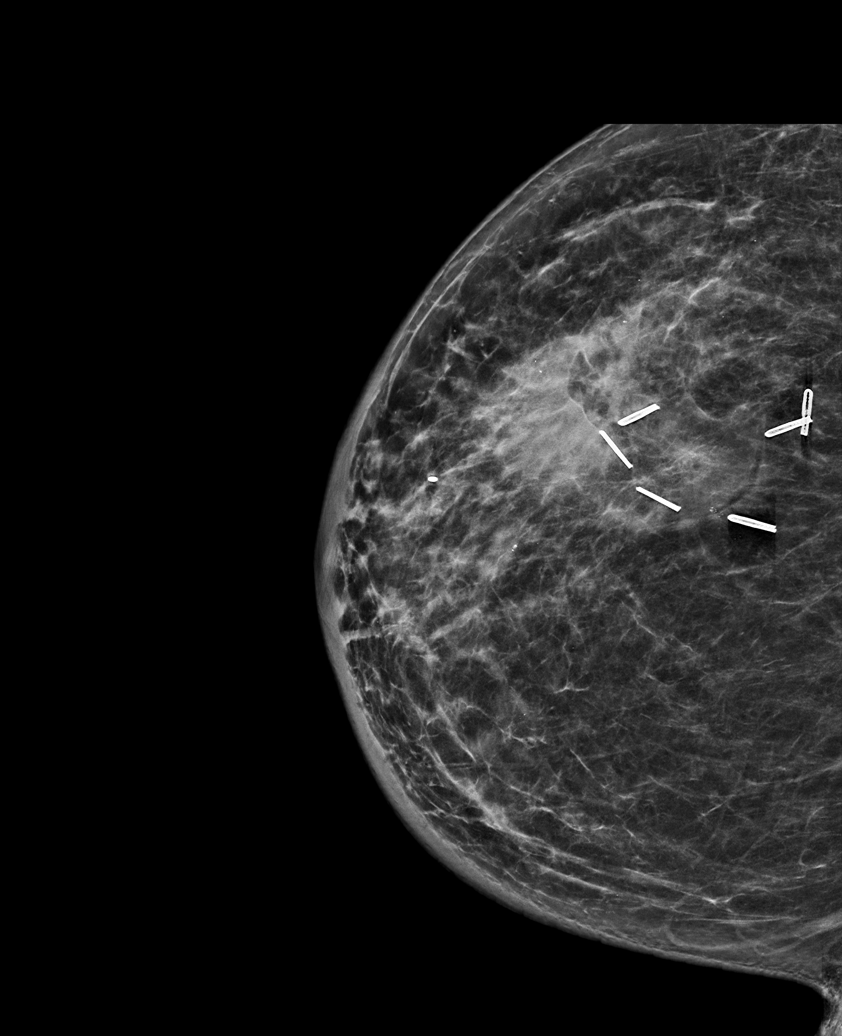

[R MLO synth-2D]
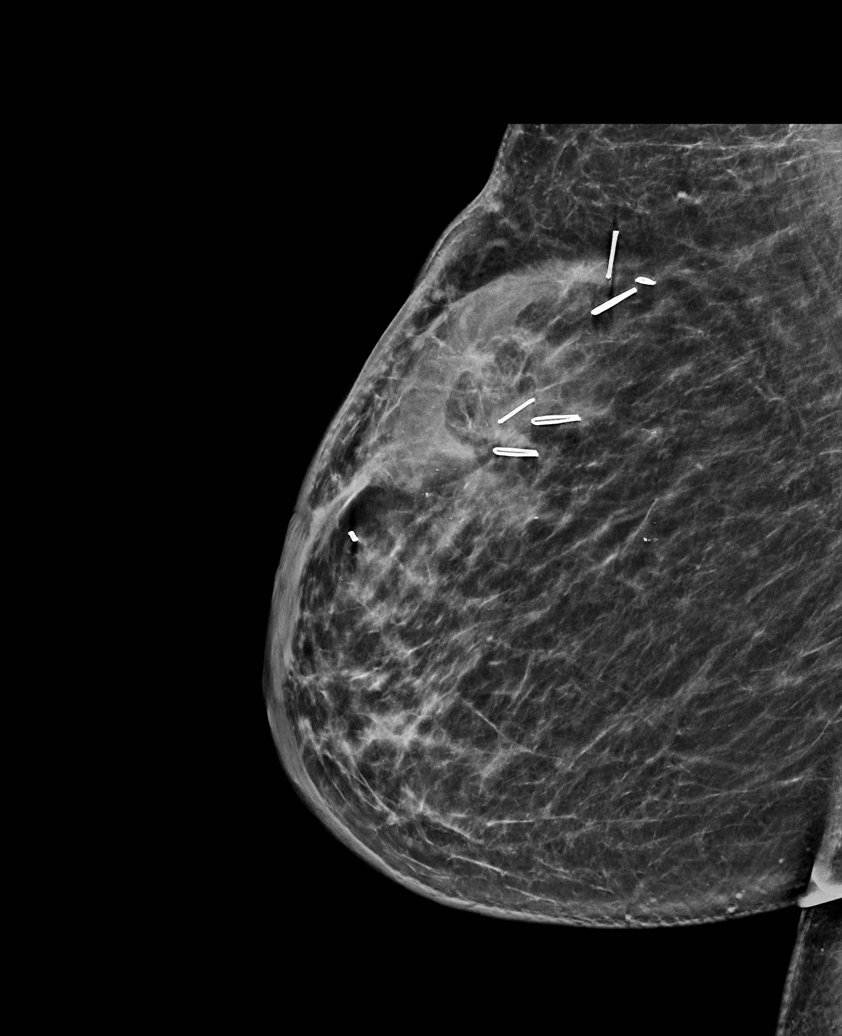

[L CC synth-2D]
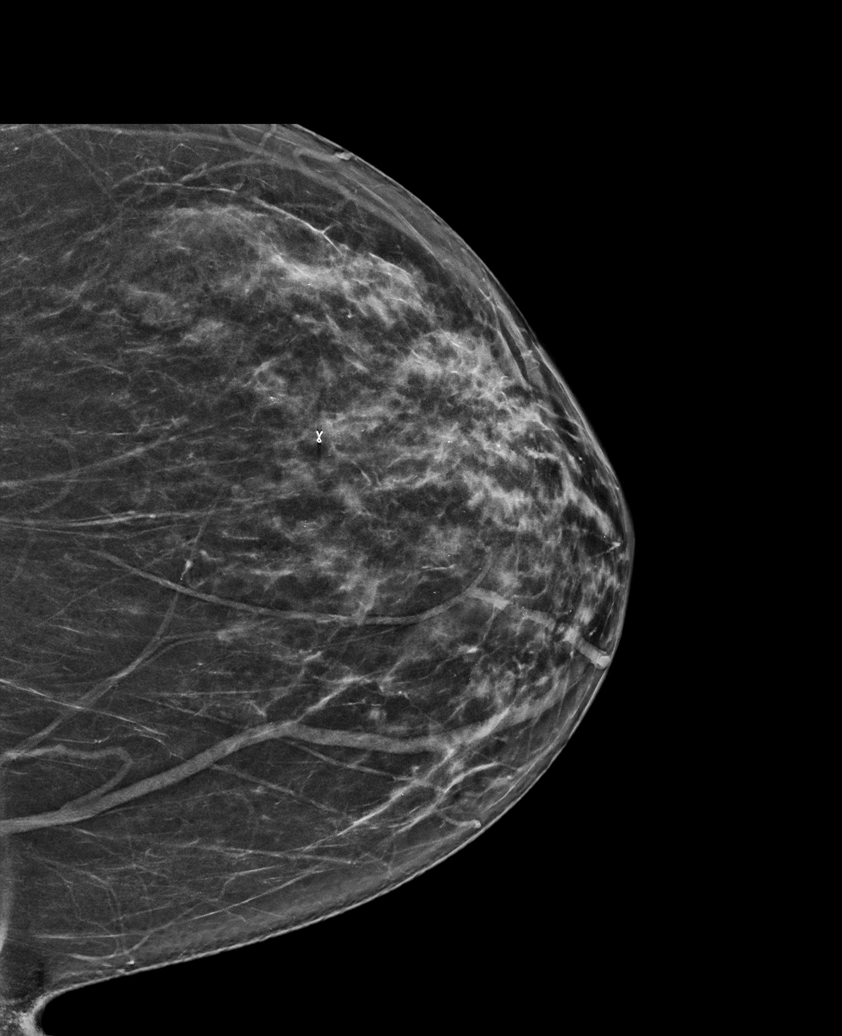

[L MLO synth-2D]
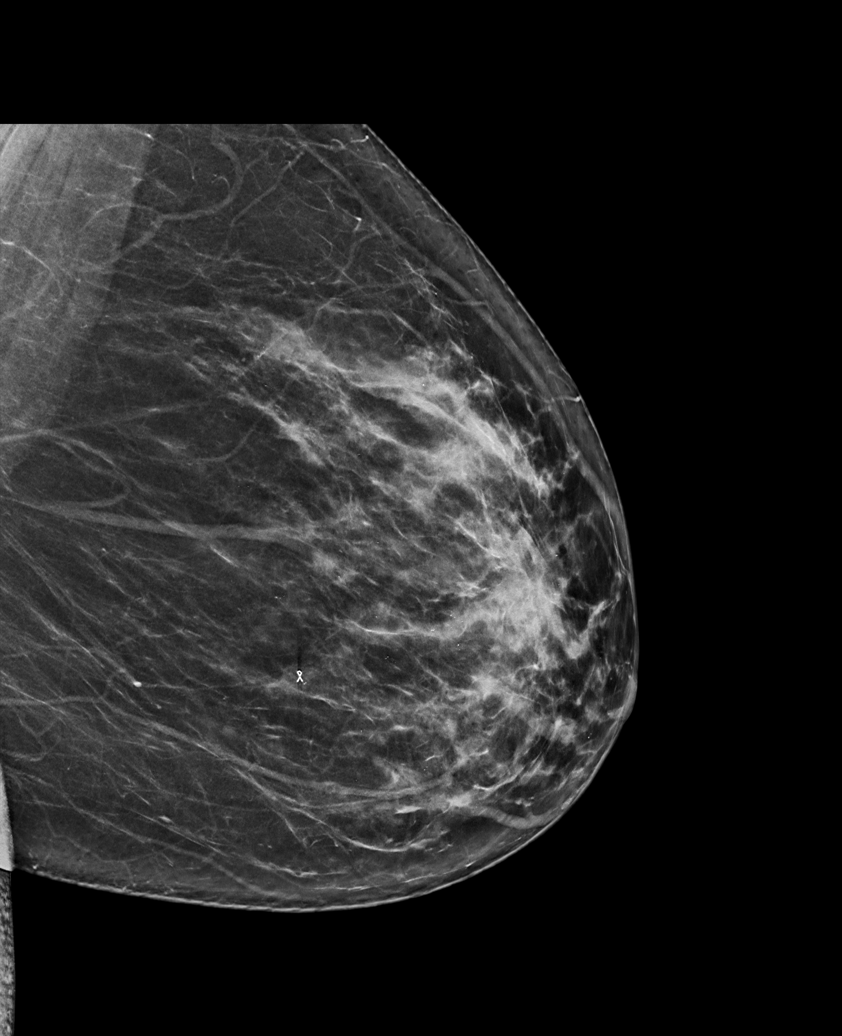

[L CC tomo · tomo slice 35/69.0]
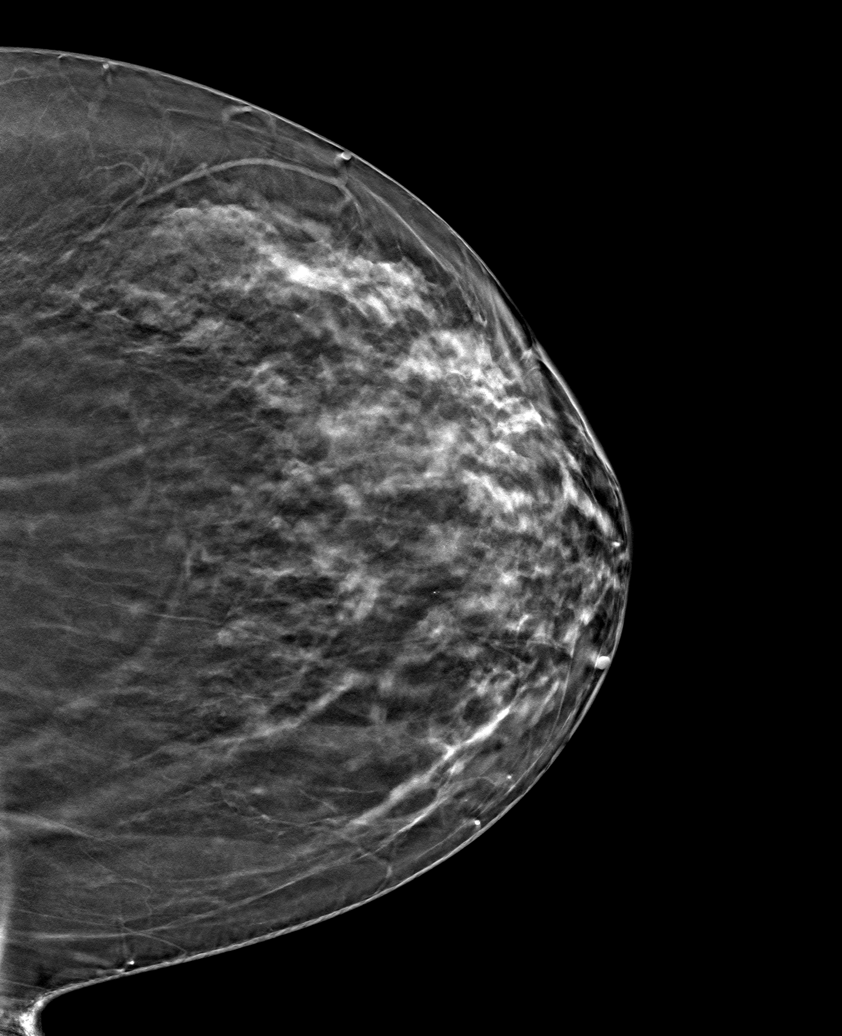

[6 of 25 positions shown; findings below may reference images not displayed]

ACR Breast Density Category c: The breast tissue is heterogeneously
dense, which may obscure small masses.
FINDINGS: Post lumpectomy changes in the upper outer right breast are noted as
is trabecular and skin thickening of the right breast consistent
with radiation induced edema.

There are no masses or areas of nonsurgical architectural
distortion. There are no suspicious calcifications.
IMPRESSION: 1. No evidence of new or recurrent breast malignancy.
2. Benign post lumpectomy and post radiation changes on the right.
3. Benign post excisional biopsy changes on the left.

RECOMMENDATION:
Diagnostic mammography in 1 year per standard post lumpectomy
protocol.

I have discussed the findings and recommendations with the patient.
If applicable, a reminder letter will be sent to the patient
regarding the next appointment.

BI-RADS CATEGORY  2: Benign.

## 2021-12-03 ENCOUNTER — Inpatient Hospital Stay: Payer: BC Managed Care – PPO | Admitting: Hematology and Oncology

## 2021-12-03 ENCOUNTER — Ambulatory Visit: Payer: BC Managed Care – PPO | Admitting: Physical Therapy

## 2021-12-07 ENCOUNTER — Encounter: Payer: Self-pay | Admitting: Physical Therapy

## 2021-12-07 ENCOUNTER — Ambulatory Visit: Payer: BC Managed Care – PPO | Attending: General Surgery | Admitting: Physical Therapy

## 2021-12-07 DIAGNOSIS — I890 Lymphedema, not elsewhere classified: Secondary | ICD-10-CM | POA: Insufficient documentation

## 2021-12-07 DIAGNOSIS — R293 Abnormal posture: Secondary | ICD-10-CM | POA: Insufficient documentation

## 2021-12-07 DIAGNOSIS — C50411 Malignant neoplasm of upper-outer quadrant of right female breast: Secondary | ICD-10-CM | POA: Insufficient documentation

## 2021-12-07 DIAGNOSIS — M25611 Stiffness of right shoulder, not elsewhere classified: Secondary | ICD-10-CM | POA: Diagnosis not present

## 2021-12-07 NOTE — Patient Instructions (Signed)
Self manual lymph drainage: Perform this sequence once a day.  Only give enough pressure no your skin to make the skin move.  Diaphragmatic - Supine   Inhale through nose making navel move out toward hands. Exhale through puckered lips, hands follow navel in. Repeat _5__ times. Rest _10__ seconds between repeats.   Copyright  VHI. All rights reserved.  Hug yourself.  Do circles at your neck just above your collarbones.  Repeat this 10 times.  Axilla - One at a Time   Using full weight of flat hand and fingers at center of uninvolved armpit, make _10__ in-place circles.   Copyright  VHI. All rights reserved.  LEG: Inguinal Nodes Stimulation   With small finger side of hand against hip crease on involved side, gently perform circles at the crease. Repeat __10_ times.   Copyright  VHI. All rights reserved.  Axilla to Inguinal Nodes - Sweep   On involved side, stretch skin_4__ times from armpit along side of trunk to hip crease.  Now gently stretch skin from the involved side to the uninvolved side across the chest at the shoulder line.  Repeat that 4 times.  Draw an imaginary diagonal line from upper outer breast through the nipple area toward lower inner breast.  Direct fluid upward and inward from this line toward the pathway across your upper chest .  Do this in three rows to treat all of the upper inner breast tissue, and do each row 3-4x.      Direct fluid to treat all of lower outer breast tissue downward and outward toward pathway that is aimed at the left groin.  Finish by doing the pathways as described above going from your involved armpit to the same side groin and going across your upper chest from the involved shoulder to the uninvolved shoulder.  Repeat the steps above where you do circles in your right groin and left armpit. Copyright  VHI. All rights reserved.   

## 2021-12-07 NOTE — Therapy (Addendum)
OUTPATIENT PHYSICAL THERAPY ONCOLOGY TREATMENT  Patient Name: Cathy Parrish MRN: [id number] DOB:[birth year], 51 y.o., female Today's Date: 12/07/2021   PT End of Session - 12/07/21 0811     Visit Number 2    Number of Visits 9    Date for PT Re-Evaluation 12/22/21    PT Start Time 0803    PT Stop Time 0851    PT Time Calculation (min) 48 min    Activity Tolerance Patient tolerated treatment well    Behavior During Therapy [facility] for tasks assessed/performed             Past Medical History:  Diagnosis Date   Anxiety    Arthritis    shoulders   Asthma    Breast cancer ([place]) 11/2019   Depression    Diabetes mellitus without complication ([place])    diet controlled   Family history of breast cancer    Family history of colon cancer    Family history of liver cancer    Headache    Hypertension    Personal history of chemotherapy    Personal history of radiation therapy    Stroke ([place])    TIA no deficits, 6 years ago per pt   Past Surgical History:  Procedure Laterality Date   AXILLARY SENTINEL NODE BIOPSY Right 06/24/2020   Procedure: RIGHT AXILLARY SENTINEL LYMPH NODE BIOPSY;  Surgeon: Stark Klein, MD;  Location: [place];  Service: General;  Laterality: Right;   BREAST BIOPSY Right    BREAST LUMPECTOMY WITH RADIOACTIVE SEED AND SENTINEL LYMPH NODE BIOPSY Right 06/24/2020   Procedure: RIGHT BREAST LUMPECTOMY WITH RADIOACTIVE SEED X 2;  Surgeon: Stark Klein, MD;  Location: [place];  Service: General;  Laterality: Right;   COLONOSCOPY  07/13/2011   Procedure: COLONOSCOPY;  Surgeon: Gatha Mayer, MD;  Location: [place];  Service: Endoscopy;  Laterality: N/A;   DILATION AND CURETTAGE OF UTERUS  abortion   DILITATION & CURRETTAGE/HYSTROSCOPY WITH NOVASURE ABLATION N/A 09/28/2012   Procedure: DILATATION & CURETTAGE/HYSTEROSCOPY WITH NOVASURE ABLATION; And Resectoscope;  Surgeon: Marvene Staff, MD;  Location: [facility];  Service: Gynecology;  Laterality: N/A;    ESOPHAGOGASTRODUODENOSCOPY  07/13/2011   Procedure: ESOPHAGOGASTRODUODENOSCOPY (EGD);  Surgeon: Gatha Mayer, MD;  Location: [facility] ENDOSCOPY;  Service: Endoscopy;  Laterality: N/A;   PORT-A-CATH REMOVAL Left 06/24/2020   Procedure: REMOVAL PORT-A-CATH;  Surgeon: Stark Klein, MD;  Location: [place];  Service: General;  Laterality: Left;   PORTACATH PLACEMENT N/A 01/02/2020   Procedure: INSERTION PORT-A-CATH;  Surgeon: Stark Klein, MD;  Location: [place];  Service: General;  Laterality: N/A;   RADIOACTIVE SEED GUIDED EXCISIONAL BREAST BIOPSY Left 06/24/2020   Procedure: RADIOACTIVE SEED GUIDED EXCISIONAL LEFT BREAST BIOPSY;  Surgeon: Stark Klein, MD;  Location: [place];  Service: General;  Laterality: Left;   SENTINEL NODE BIOPSY Right 06/24/2020   Procedure: RADIOACTIVE SEED LOCALIZED RIGHT LYMPH NODE BIOPSY;  Surgeon: Stark Klein, MD;  Location: [place];  Service: General;  Laterality: Right;   TONSILLECTOMY     Patient Active Problem List   Diagnosis Date Noted   Chemotherapy-induced peripheral neuropathy ([place]) 11/02/2020   Genetic testing 02/28/2020   Port-A-Cath in place 01/17/2020   Family history of breast cancer    Family history of colon cancer    Family history of liver cancer    Malignant neoplasm of upper-outer quadrant of right breast in female, estrogen receptor positive ([place]) 12/19/2019   B12 deficiency 07/12/2011   NSAID long-term  use-[employer] 07/12/2011   Weakness 07/11/2011   Iron deficiency anemia 07/11/2011   Hypertension     PCP: Tereasa Coop, PA-C  REFERRING PROVIDER: Stark Klein, MD   REFERRING DIAG: [id number] (ICD-10-CM) - Other specified disorders of breast C50.411 (ICD-10-CM) - Malignant neoplasm of upper-outer quadrant of right female breast Z17.0 (ICD-10-CM) - Estrogen receptor positive status (ER+)   THERAPY DIAG:  Lymphedema, not elsewhere classified  Stiffness of right shoulder, not elsewhere classified  Abnormal posture  Malignant  neoplasm of upper-outer quadrant of right female breast, unspecified estrogen receptor status ([place])  ONSET DATE: April 2023  Rationale for Evaluation and Treatment Rehabilitation  SUBJECTIVE                                                                                                                                                                                           SUBJECTIVE STATEMENT: I have been trying to work on the massage. I have not gotten a compression bra yet.  PERTINENT HISTORY:  Diagnosed with right breast cancer October 202 it was weakly ER positive, PR negative and HER2 negative.  Patient underwent neoadjuvant chemotherapy. S/P R breast lumpectomy and sentinel lymph node (0/2) June 24, 2020. The left side had a seed localized excisional biopsy. There was additional area on the right that also was excised for biopsy. She had no residual cancer and the excisional biopsies were benign. Following this she had adjuvant radiation and started on tamoxifen. Had an episode that was significant for infected seroma with cellulitis on the right in May 2023. She had imaging and aspiration as well as oral antibiotics. Recently right breast was ultrasounded and aspirated by the PA. Only 22 cc were aspirated.  PAIN:  Are you having pain? No NPRS scale: 5/10 Pain location: R breast Pain orientation: Right  PAIN TYPE: sharp and constant Pain description: constant and sharp Aggravating factors: nothing Relieving factors: hot shower, warm rag on breast  PRECAUTIONS: Other: breast lymphedema  WEIGHT BEARING RESTRICTIONS No  FALLS:  Has patient fallen in last 6 months? No  LIVING ENVIRONMENT: Lives with: lives with their son Lives in: House/apartment Stairs: No;  Has following equipment at home: Single point cane  OCCUPATION: full time - billing specialist for [employer]: exercise 5 days/wk, 3 days/wk goes to gym and does treadmill and elliptical; does stretching  HAND  DOMINANCE : right   PRIOR LEVEL OF FUNCTION: Independent  PATIENT GOALS avoid fluid from coming back, decrease swelling   OBJECTIVE  COGNITION:  Overall cognitive status: Within functional limits for tasks assessed   PALPATION: Area of fibrosis in area of seroma  OBSERVATIONS /  OTHER ASSESSMENTS: R breast larger than left with increased pore size noted and seroma palpable superior to areola  POSTURE: forward head and rounded  UPPER EXTREMITY AROM/PROM:  A/PROM RIGHT   eval   Shoulder extension 75  Shoulder flexion 130  Shoulder abduction 120  Shoulder internal rotation 68  Shoulder external rotation 72    (Blank rows = not tested)  A/PROM LEFT   eval  Shoulder extension 80  Shoulder flexion 170  Shoulder abduction 179  Shoulder internal rotation 75  Shoulder external rotation 87    (Blank rows = not tested)    LYMPHEDEMA ASSESSMENTS:   SURGERY TYPE/DATE: 06/24/20 R breast lumpectomy and SLNB  NUMBER OF LYMPH NODES REMOVED: 0/2  CHEMOTHERAPY: neoadjuvant completed  RADIATION:completed  HORMONE TREATMENT: on tamoxifen  INFECTIONS: none  LYMPHEDEMA ASSESSMENTS:   LANDMARK RIGHT  eval  10 cm proximal to olecranon process 39  Olecranon process 30  10 cm proximal to ulnar styloid process 22  Just proximal to ulnar styloid process 17  Across hand at thumb web space 20.3  At base of 2nd digit 6.8  (Blank rows = not tested)  LANDMARK LEFT  eval  10 cm proximal to olecranon process 38.5  Olecranon process 28.6  10 cm proximal to ulnar styloid process 21.7  Just proximal to ulnar styloid process 16.6  Across hand at thumb web space 19.4  At base of 2nd digit 6.5  (Blank rows = not tested)     BREAST COMPLAINTS SURVEY: 75   TODAY'S TREATMENT  12/07/21: Instructed pt in the following and had her demonstrate entire sequence while providing mod verbal and tactile cues for correct sequence and skin stretch: short neck, 5 diaphragmatic breaths, left  axillary nodes and establishment of interaxillary pathway, R inguinal nodes and establishment of axillo inguinal pathway, R breast moving fluid towards pathways and then retracing all steps. Pt practiced self MLD with cueing then therapist performed the remaining. Issued handout for pt to do at home.  PROM to R shoulder in to flexion and abduction to pt's tolerance  Educated pt about coverage for sleeves - plan to have pt go to [facility] once measured for an off the shelf and pt to order glove off compression guru  11/24/21: In supine with head on 2 pillows as follows: short neck, 5 diaphragmatic breaths, left axillary nodes and establishment of interaxillary pathway, R inguinal nodes and establishment of axillo inguinal pathway, R breast moving fluid towards pathways and then retracing all steps while educating pt throughout on anatomy and physiology of the lymphatic system and basic MLD principles and techniques  Therapeutic exercise: Instructed pt in supine dowel exercises in direction of flexion and abduction with 5 sec holds while providing verbal and tactile cues for pt to perform correctly  PATIENT EDUCATION:  Education details: self MLD, how to obtain compression garments Person educated: Patient Education method: Explanation and Handouts Education comprehension: verbalized understanding   HOME EXERCISE PROGRAM: Supine dowel abduction and flexion  ASSESSMENT:  CLINICAL IMPRESSION: Pt returns to PT and was instructed in self MLD and issued a handout for practicing at home. Encouraged pt to get a compression bra to help reduce breast lymphedema. Pt plans to get an appointment for this. Educated pt about her insurance benefits for a compression sleeve and the plan to proceed with this by going through [place] for the sleeve and compression guru for the glove.    OBJECTIVE IMPAIRMENTS decreased knowledge of condition, decreased knowledge  of use of DME, decreased ROM, increased  edema, increased fascial restrictions, postural dysfunction, and pain.   ACTIVITY LIMITATIONS lifting and reach over head  PARTICIPATION LIMITATIONS:  none  PERSONAL FACTORS Time since onset of injury/illness/exacerbation are also affecting patient's functional outcome.   REHAB POTENTIAL: Good  CLINICAL DECISION MAKING: Stable/uncomplicated  EVALUATION COMPLEXITY: Low  GOALS: Goals reviewed with patient? Yes  SHORT TERM GOALS=LONG TERM GOALS  Target date: 12/22/2021    Pt will be independent in self MLD for long term management of lymphedema.  Baseline: Goal status: INITIAL  2.  Pt will demonstrate 160 degrees of R shoulder flexion to allow pt to reach overhead.  Baseline: 130 Goal status: INITIAL  3.  Pt will demonstrate 160 degrees of R shoulder abduction to allow her to reach out to the side. Baseline: 120 Goal status: INITIAL  4.  Pt will obtain a compression bra for long term management of lymphedema.  Baseline:  Goal status: INITIAL  5.  Pt will obtain a compression sleeve and glove to wear for management of R UE edema.  Baseline:  Goal status: INITIAL  6.  Pt will report a 75% improvement in pain in R breast to allow improved comfort. Baseline:  Goal status: INITIAL  PLAN: PT FREQUENCY: 2x/week  PT DURATION: 4 weeks  PLANNED INTERVENTIONS: Therapeutic exercises, Therapeutic activity, Patient/Family education, Self Care, Joint mobilization, Orthotic/Fit training, Manual lymph drainage, Compression bandaging, scar mobilization, Taping, Vasopneumatic device, and Manual therapy  PLAN FOR NEXT SESSION: continue MLD to R breast, assess indep with this, PROM to R shoulder, how are dowel exercises? Measure for compression sleeve -measure for sleeve and send pt to [facility] with what sleeve and size to purchase and have pt purchase glove from compression guru   [employer], PT 12/07/2021, 9:43 AM  PHYSICAL THERAPY DISCHARGE SUMMARY  Visits from  Start of Care: 2  Current functional level related to goals / functional outcomes: See above   Remaining deficits: See above   Education / Equipment: MLD, compression garments   Patient agrees to discharge. Patient goals were not met. Patient is being discharged due to not returning since the last visit.  Milagros Loll [place], [place] 06/18/22 11:36 AM

## 2021-12-08 ENCOUNTER — Ambulatory Visit
Admission: RE | Admit: 2021-12-08 | Discharge: 2021-12-08 | Disposition: A | Discharge status: Home / Self Care | Payer: BC Managed Care – PPO | Source: Ambulatory Visit | Attending: Adult Health | Admitting: Adult Health

## 2021-12-08 DIAGNOSIS — N6022 Fibroadenosis of left breast: Secondary | ICD-10-CM | POA: Diagnosis not present

## 2021-12-08 DIAGNOSIS — R928 Other abnormal and inconclusive findings on diagnostic imaging of breast: Secondary | ICD-10-CM | POA: Diagnosis not present

## 2021-12-08 DIAGNOSIS — Z9889 Other specified postprocedural states: Secondary | ICD-10-CM

## 2021-12-08 DIAGNOSIS — N6489 Other specified disorders of breast: Secondary | ICD-10-CM

## 2021-12-08 HISTORY — PX: BREAST BIOPSY: SHX20

## 2021-12-09 ENCOUNTER — Ambulatory Visit: Payer: BC Managed Care – PPO | Admitting: Physical Therapy

## 2021-12-14 ENCOUNTER — Ambulatory Visit: Payer: BC Managed Care – PPO | Admitting: Physical Therapy

## 2021-12-16 ENCOUNTER — Ambulatory Visit: Payer: BC Managed Care – PPO | Admitting: Physical Therapy

## 2021-12-21 ENCOUNTER — Ambulatory Visit: Payer: BC Managed Care – PPO | Admitting: Physical Therapy

## 2021-12-24 ENCOUNTER — Ambulatory Visit: Payer: BC Managed Care – PPO | Admitting: Physical Therapy

## 2022-01-15 DIAGNOSIS — R5383 Other fatigue: Secondary | ICD-10-CM | POA: Diagnosis not present

## 2022-01-15 DIAGNOSIS — I10 Essential (primary) hypertension: Secondary | ICD-10-CM | POA: Diagnosis not present

## 2022-01-15 DIAGNOSIS — M7989 Other specified soft tissue disorders: Secondary | ICD-10-CM | POA: Diagnosis not present

## 2022-01-15 DIAGNOSIS — R519 Headache, unspecified: Secondary | ICD-10-CM | POA: Diagnosis not present

## 2022-01-25 ENCOUNTER — Telehealth: Payer: Self-pay | Admitting: Licensed Clinical Social Worker

## 2022-01-25 NOTE — Telephone Encounter (Signed)
[place] Work  TC from pt asking if she is eligible to reapply for [employer] for help with food. It has been difficult last few months as she had pneumonia and now lymphedema. Pt not eligible as it has been less then a year (last applied Jan 2023) and is no longer in active treatment. CSW offered information on local food pantries. Pt stated she is aware of them. No other needs at this time.   Cathy Parrish E Vergia Chea, LCSW

## 2022-02-01 ENCOUNTER — Ambulatory Visit (HOSPITAL_COMMUNITY)
Admission: EM | Admit: 2022-02-01 | Discharge: 2022-02-01 | Disposition: A | Discharge status: Home / Self Care | Payer: BC Managed Care – PPO

## 2022-02-02 ENCOUNTER — Ambulatory Visit (HOSPITAL_COMMUNITY)
Admission: EM | Admit: 2022-02-02 | Discharge: 2022-02-02 | Disposition: A | Discharge status: Home / Self Care | Payer: BC Managed Care – PPO

## 2022-02-02 ENCOUNTER — Encounter (HOSPITAL_COMMUNITY): Payer: Self-pay

## 2022-02-02 VITALS — BP 140/77 | HR 80 | Temp 98.8°F | Resp 18

## 2022-02-02 DIAGNOSIS — G5603 Carpal tunnel syndrome, bilateral upper limbs: Secondary | ICD-10-CM

## 2022-02-02 MED ORDER — KETOROLAC TROMETHAMINE 30 MG/ML IJ SOLN
INTRAMUSCULAR | Status: AC
  Filled 2022-02-02: qty 1

## 2022-02-02 MED ORDER — MELOXICAM 7.5 MG PO TABS: 7.5000 mg | ORAL_TABLET | Freq: Every day | ORAL | 0 refills | Status: AC

## 2022-02-02 MED ORDER — KETOROLAC TROMETHAMINE 30 MG/ML IJ SOLN
30.0000 mg | Freq: Once | INTRAMUSCULAR | Status: AC
  Administered 2022-02-02: 30 mg via INTRAMUSCULAR

## 2022-02-02 NOTE — ED Triage Notes (Signed)
Pt states that she has bilateral hand pain x months but worse over the last week. 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 She feels like they have been swollen more the left than right. She is taking tylenol arthritis.

## 2022-02-02 NOTE — ED Provider Notes (Signed)
[place]    CSN: [id number] Arrival date & time: 02/02/22  1758      History   Chief Complaint Chief Complaint  Patient presents with   Hand Pain    HPI Cathy Parrish is a 51 y.o. female.  Patient presents complaining of bilateral wrist and hand pain that is ongoing for the past few months.  Patient reports having numbness and tingling in both hands.  Patient denies any fall or trauma upon onset of symptoms.  Patient reports a sensation of swelling to bilateral hands. Patient reports that pain has progressively worsened over the past week.  Patient states she takes gabapentin for neuropathy in her lower extremities from her diabetes mellitus.  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 Patient states that she has never been seen by another provider for this issue.      Hand Pain    Past Medical History:  Diagnosis Date   Anxiety    Arthritis    shoulders   Asthma    Breast cancer ([place]) 11/2019   Depression    Diabetes mellitus without complication ([place])    diet controlled   Family history of breast cancer    Family history of colon cancer    Family history of liver cancer    Headache    Hypertension    Personal history of chemotherapy    Personal history of radiation therapy    Stroke [name])    TIA no deficits, 6 years ago per pt    Patient Active Problem List   Diagnosis Date Noted   Chemotherapy-induced peripheral neuropathy ([place]) 11/02/2020   Genetic testing 02/28/2020   Port-A-Cath in place 01/17/2020   Family history of breast cancer    Family history of colon cancer    Family history of liver cancer    Malignant neoplasm of upper-outer quadrant of right breast in female, estrogen receptor positive ([place]) 12/19/2019   B12 deficiency 07/12/2011   NSAID long-term use-[facility] 07/12/2011   Weakness 07/11/2011   Iron deficiency anemia 07/11/2011   Hypertension     Past Surgical History:  Procedure Laterality Date    AXILLARY SENTINEL NODE BIOPSY Right 06/24/2020   Procedure: RIGHT AXILLARY SENTINEL LYMPH NODE BIOPSY;  Surgeon: Stark Klein, MD;  Location: [place];  Service: General;  Laterality: Right;   BREAST BIOPSY Right    BREAST LUMPECTOMY WITH RADIOACTIVE SEED AND SENTINEL LYMPH NODE BIOPSY Right 06/24/2020   Procedure: RIGHT BREAST LUMPECTOMY WITH RADIOACTIVE SEED X 2;  Surgeon: Stark Klein, MD;  Location: [place];  Service: General;  Laterality: Right;   COLONOSCOPY  07/13/2011   Procedure: COLONOSCOPY;  Surgeon: Gatha Mayer, MD;  Location: [place];  Service: Endoscopy;  Laterality: N/A;   DILATION AND CURETTAGE OF UTERUS  abortion   DILITATION & CURRETTAGE/HYSTROSCOPY WITH NOVASURE ABLATION N/A 09/28/2012   Procedure: DILATATION & CURETTAGE/HYSTEROSCOPY WITH NOVASURE ABLATION; And Resectoscope;  Surgeon: Marvene Staff, MD;  Location: [facility];  Service: Gynecology;  Laterality: N/A;   ESOPHAGOGASTRODUODENOSCOPY  07/13/2011   Procedure: ESOPHAGOGASTRODUODENOSCOPY (EGD);  Surgeon: Gatha Mayer, MD;  Location: [facility] ENDOSCOPY;  Service: Endoscopy;  Laterality: N/A;   PORT-A-CATH REMOVAL Left 06/24/2020   Procedure: REMOVAL PORT-A-CATH;  Surgeon: Stark Klein, MD;  Location: [place];  Service: General;  Laterality: Left;   PORTACATH PLACEMENT N/A 01/02/2020   Procedure: INSERTION PORT-A-CATH;  Surgeon: Stark Klein, MD;  Location: [place];  Service: General;  Laterality: N/A;  RADIOACTIVE SEED GUIDED EXCISIONAL BREAST BIOPSY Left 06/24/2020   Procedure: RADIOACTIVE SEED GUIDED EXCISIONAL LEFT BREAST BIOPSY;  Surgeon: Stark Klein, MD;  Location: [place];  Service: General;  Laterality: Left;   SENTINEL NODE BIOPSY Right 06/24/2020   Procedure: RADIOACTIVE SEED LOCALIZED RIGHT LYMPH NODE BIOPSY;  Surgeon: Stark Klein, MD;  Location: [place];  Service: General;  Laterality: Right;   TONSILLECTOMY      OB History   No obstetric history on file.      Home Medications     Prior to Admission medications   Medication Sig Start Date End Date Taking? Authorizing Provider  ALPRAZolam (XANAX) 0.5 MG tablet TAKE 1 TABLET(0.5 MG) BY MOUTH AT BEDTIME AS NEEDED FOR ANXIETY 11/03/21  Yes Nicholas Lose, MD  atorvastatin (LIPITOR) 20 MG tablet Take 1 tablet (20 mg total) by mouth daily. 12/10/18  Yes Stallings, Arlie Solomons, MD  Cholecalciferol (VITAMIN D) 125 MCG (5000 UT) CAPS Take 5,000 Units by mouth daily.   Yes [provider]  dicyclomine (BENTYL) 10 MG capsule TAKE 1 CAPSULE(10 MG) BY MOUTH THREE TIMES DAILY AS NEEDED FOR SPASMS 12/04/20  Yes Nicholas Lose, MD  escitalopram (LEXAPRO) 20 MG tablet TAKE 1 TABLET(20 MG) BY MOUTH DAILY 12/22/21  Yes [provider]  furosemide (LASIX) 20 MG tablet TAKE 1 TABLET(20 MG) BY MOUTH DAILY 02/17/21  Yes Nicholas Lose, MD  gabapentin (NEURONTIN) 300 MG capsule TAKE 1 CAPSULE(300 MG) BY MOUTH AT BEDTIME 08/11/21  Yes Nicholas Lose, MD  loratadine (CLARITIN) 10 MG tablet Take 10 mg by mouth daily.   Yes [provider]  meloxicam (MOBIC) 7.5 MG tablet Take 1 tablet (7.5 mg total) by mouth daily. 02/02/22  Yes Flossie Dibble, NP  potassium chloride SA (KLOR-CON M) 20 MEQ tablet Take 1 tablet (20 mEq total) by mouth 2 (two) times daily. 03/06/21  Yes Nicholas Lose, MD  SUMAtriptan (IMITREX) 50 MG tablet Take one tablet by mouth at the first sign of headache. May repeat in 2 hours if headache persists or recurs. Patient taking differently: Take 50 mg by mouth every 2 (two) hours as needed. May repeat in 2 hours if headache persists or recurs. 09/04/18  Yes Stallings, Zoe A, MD  tamoxifen (NOLVADEX) 20 MG tablet TAKE 1 TABLET(20 MG) BY MOUTH DAILY 09/16/21  Yes Nicholas Lose, MD  tetrahydrozoline 0.05 % ophthalmic solution Place 1 drop into both eyes daily as needed (dry/irritated eyes).   Yes [provider]  acetaminophen (TYLENOL) 650 MG CR tablet Take 1,300 mg by mouth every 8 (eight) hours as needed for  pain.    [provider]  amoxicillin-clavulanate (AUGMENTIN) 875-125 MG tablet Take 1 tablet by mouth 2 (two) times daily. 07/28/21   Gardenia Phlegm, NP  ibuprofen (ADVIL) 800 MG tablet Take 800 mg by mouth every 8 (eight) hours as needed for moderate pain. 03/20/20   [provider]  irbesartan-hydrochlorothiazide (AVALIDE) 150-12.5 MG tablet Take 1 tablet by mouth daily. 07/20/19   Forrest Moron, MD  prochlorperazine (COMPAZINE) 10 MG tablet TAKE 1 TABLET(10 MG) BY MOUTH EVERY 6 HOURS AS NEEDED FOR NAUSEA Patient not taking: Reported on 07/23/2020 05/13/20 09/30/20  Nicholas Lose, MD    Family History Family History  Problem Relation Age of Onset   Liver cancer Father        dx early 48s   Breast cancer Maternal Aunt        dx 70s   Colon cancer Maternal Aunt  dx 24s   Cancer Paternal Uncle        unknown type, dx >50   Breast cancer Half-Sister 72   Esophageal cancer Neg Hx    Stomach cancer Neg Hx     Social History Social History   Tobacco Use   Smoking status: Never   Smokeless tobacco: Never  Vaping Use   Vaping Use: Never used  Substance Use Topics   Alcohol use: Not Currently   Drug use: No     Allergies   Latex   Review of Systems Review of Systems Per HPI   Physical Exam Triage Vital Signs ED Triage Vitals  Enc Vitals Group     BP 02/02/22 1819 (!) 140/77     Pulse Rate 02/02/22 1819 80     Resp 02/02/22 1819 18     Temp 02/02/22 1819 98.8 F (37.1 C)     Temp Source 02/02/22 1819 Oral     SpO2 02/02/22 1819 99 %     Weight --      Height --      Head Circumference --      Peak Flow --      Pain Score 02/02/22 1816 10     Pain Loc --      Pain Edu? --      Excl. in [place]? --    No data found.  Updated Vital Signs BP (!) 140/77 (BP Location: Right Arm)   Pulse 80   Temp 98.8 F (37.1 C) (Oral)   Resp 18   SpO2 99%     Physical Exam Vitals and nursing note reviewed.  Constitutional:      Appearance:  Normal appearance.  Cardiovascular:     Pulses:          Radial pulses are 2+ on the right side and 2+ on the left side.  Musculoskeletal:     Right wrist: Tenderness present. No swelling, deformity, bony tenderness, snuff box tenderness or crepitus. Normal range of motion. Normal pulse.     Left wrist: Tenderness present. No swelling, deformity, bony tenderness, snuff box tenderness or crepitus. Normal range of motion. Normal pulse.     Right hand: Normal. No swelling, deformity, tenderness or bony tenderness. Normal range of motion. Normal strength. Normal sensation. Normal capillary refill. Normal pulse.     Left hand: Normal. No swelling, deformity, tenderness or bony tenderness. Normal range of motion. Normal strength. Normal sensation. Normal capillary refill. Normal pulse.     Comments: Positive Phalens Test. Patient reports tenderness to posterior side of wrist along medial side bilaterally.     Skin:    General: Skin is warm.     Capillary Refill: Capillary refill takes less than 2 seconds.     Comments: Normal capillary refill in bilateral   Neurological:     Mental Status: She is alert.      UC Treatments / Results  Labs (all labs ordered are listed, but only abnormal results are displayed) Labs Reviewed - No data to display  EKG   Radiology No results found.  Procedures Procedures (including critical care time)  Medications Ordered in UC Medications  ketorolac (TORADOL) 30 MG/ML injection 30 mg (30 mg Intramuscular Given 02/02/22 1855)    Initial Impression / Assessment and Plan / UC Course  I have reviewed the triage vital signs and the nursing notes.  Pertinent labs & imaging results that were available during my care of the patient were reviewed by me  and considered in my medical decision making (see chart for details).     Patient was evaluated for carpal tunnel bilaterally. Unable to give glucocorticoids due to DM and concern for hyperglycemia, last A1c  in chart was 6.8%.  Due to this patient was given a Toradol injection in office .  Patient was prescribed meloxicam for discharge, patient was made aware of due to GI bleeding risk, she was made aware that she will need to put food on her stomach each time that she takes this medication . Wrist brace given in office to wear on using this at night.  Patient was made aware to go ahead and schedule a follow-up with [facility].  Patient verbalized that she will go ahead and schedule an appointment for follow-up.  Final Clinical Impressions(s) / UC Diagnoses   Final diagnoses:  Bilateral carpal tunnel syndrome     Discharge Instructions      Meloxicam has been sent to the pharmacy, you can take this 1 time daily for the next 2 weeks. Please make sure to put food on your stomach when you are taking this medication.   Please call [facility] and schedule an appointment for follow up.   Use the braces we gave you at night.        ED Prescriptions     Medication Sig Dispense Auth. Provider   meloxicam (MOBIC) 7.5 MG tablet Take 1 tablet (7.5 mg total) by mouth daily. 14 tablet Flossie Dibble, NP      PDMP not reviewed this encounter.   Flossie Dibble, NP 02/02/22 2115

## 2022-02-02 NOTE — Discharge Instructions (Addendum)
Meloxicam has been sent to the pharmacy, you can take this 1 time daily for the next 2 weeks. Please make sure to put food on your stomach when you are taking this medication.   Please call [facility] and schedule an appointment for follow up.   Use the braces we gave you at night.

## 2022-02-19 DIAGNOSIS — G5603 Carpal tunnel syndrome, bilateral upper limbs: Secondary | ICD-10-CM | POA: Diagnosis not present

## 2022-02-19 DIAGNOSIS — M65312 Trigger thumb, left thumb: Secondary | ICD-10-CM | POA: Diagnosis not present

## 2022-03-18 ENCOUNTER — Telehealth: Payer: Self-pay | Admitting: Hematology and Oncology

## 2022-03-18 ENCOUNTER — Telehealth: Payer: Self-pay

## 2022-03-18 NOTE — Telephone Encounter (Signed)
Patient called to r/s missed appointments to go over specimen results. Patient scheduled and notified.

## 2022-03-18 NOTE — Telephone Encounter (Signed)
Notified Patient of response from [facility] regarding prior authorization request for Tamoxifen '20mg'$  Tablets. Per [facility] response, Patient does not require a prior authorization for Tamoxifen. Medication is covered and Patient has a co-payment for a 90 days supply. Understanding verbalized. Patient questioning co-payment of $20 instead of $10 as specified by [facility]. Advised Patient to contact [facility] and have them contact her Pharmacy in order to resolve the issue of co-payment. [facility] telephone number [phone] provided to Patient. No other needs or concerns voiced at this time.

## 2022-03-23 ENCOUNTER — Other Ambulatory Visit: Payer: Self-pay | Admitting: *Deleted

## 2022-03-23 DIAGNOSIS — C50411 Malignant neoplasm of upper-outer quadrant of right female breast: Secondary | ICD-10-CM

## 2022-03-23 NOTE — Assessment & Plan Note (Signed)
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  Neoadjuvant chemotherapy with dose dense Adriamycin and Cytoxan followed by Taxol (stopped after 7 cycles) and Carbo 2. Bilateral Lumpectomies: Complete response 2 SLN Neg 3.  Adjuvant radiation 08/08/2020-09/22/2020  4.  Followed by adjuvant antiestrogen therapy with tamoxifen  --------------------------------------------------------------------------------------------------------------------------------------------------   Tamoxifen toxicities: 1.  Hot flashes: Improved significantly and now mild to moderate.  Takes 20 mg tamoxifen daily. 2. Fatigue: Labs have been performed, CBC looks normal.     Breast cancer surveillance: Mammogram 11/26/2021: Distortion was noted on biopsy was benign, breast density category C    Ultrasound lower extremity: 01/20/2021: No evidence of DVT   Return to clinic in 1 year for follow-up

## 2022-03-24 ENCOUNTER — Other Ambulatory Visit: Payer: Self-pay

## 2022-03-24 ENCOUNTER — Inpatient Hospital Stay: Payer: BC Managed Care – PPO

## 2022-03-24 ENCOUNTER — Inpatient Hospital Stay: Payer: BC Managed Care – PPO | Attending: Hematology and Oncology | Admitting: Hematology and Oncology

## 2022-03-24 VITALS — BP 124/69 | HR 88 | Temp 97.4°F | Resp 18 | Ht 65.5 in | Wt 282.3 lb

## 2022-03-24 DIAGNOSIS — C50411 Malignant neoplasm of upper-outer quadrant of right female breast: Secondary | ICD-10-CM | POA: Insufficient documentation

## 2022-03-24 DIAGNOSIS — Z170 Estrogen receptor positive status [ER+]: Secondary | ICD-10-CM | POA: Diagnosis not present

## 2022-03-24 DIAGNOSIS — Z79899 Other long term (current) drug therapy: Secondary | ICD-10-CM | POA: Insufficient documentation

## 2022-03-24 DIAGNOSIS — Z9221 Personal history of antineoplastic chemotherapy: Secondary | ICD-10-CM | POA: Diagnosis not present

## 2022-03-24 DIAGNOSIS — Z923 Personal history of irradiation: Secondary | ICD-10-CM | POA: Diagnosis not present

## 2022-03-24 LAB — CMP (CANCER CENTER ONLY)
ALT: 11 U/L (ref 0–44)
AST: 15 U/L (ref 15–41)
Albumin: 3.5 g/dL (ref 3.5–5.0)
Alkaline Phosphatase: 60 U/L (ref 38–126)
Anion gap: 7 (ref 5–15)
BUN: 13 mg/dL (ref 6–20)
CO2: 26 mmol/L (ref 22–32)
Calcium: 8.8 mg/dL — ABNORMAL LOW (ref 8.9–10.3)
Chloride: 108 mmol/L (ref 98–111)
Creatinine: 0.63 mg/dL (ref 0.44–1.00)
GFR, Estimated: >60 mL/min (ref >60)
Glucose, Bld: 101 mg/dL — ABNORMAL HIGH (ref 70–99)
Potassium: 3.9 mmol/L (ref 3.5–5.1)
Sodium: 141 mmol/L (ref 135–145)
Total Bilirubin: 0.5 mg/dL (ref 0.3–1.2)
Total Protein: 6.8 g/dL (ref 6.5–8.1)

## 2022-03-24 LAB — CBC WITH DIFFERENTIAL (CANCER CENTER ONLY)
Abs Immature Granulocytes: 0.01 10*3/uL (ref 0.00–0.07)
Basophils Absolute: 0 10*3/uL (ref 0.0–0.1)
Basophils Relative: 1 %
Eosinophils Absolute: 0.2 10*3/uL (ref 0.0–0.5)
Eosinophils Relative: 4 %
HCT: 37.5 % (ref 36.0–46.0)
Hemoglobin: 12.8 g/dL (ref 12.0–15.0)
Immature Granulocytes: 0 %
Lymphocytes Relative: 48 %
Lymphs Abs: 2.5 10*3/uL (ref 0.7–4.0)
MCH: 28.6 pg (ref 26.0–34.0)
MCHC: 34.1 g/dL (ref 30.0–36.0)
MCV: 83.7 fL (ref 80.0–100.0)
Monocytes Absolute: 0.6 10*3/uL (ref 0.1–1.0)
Monocytes Relative: 11 %
Neutro Abs: 1.9 10*3/uL (ref 1.7–7.7)
Neutrophils Relative %: 36 %
Platelet Count: 237 10*3/uL (ref 150–400)
RBC: 4.48 MIL/uL (ref 3.87–5.11)
RDW: 14.6 % (ref 11.5–15.5)
Smear Review: NORMAL
WBC Count: 5.3 10*3/uL (ref 4.0–10.5)
nRBC: 0 % (ref 0.0–0.2)

## 2022-03-24 MED ORDER — DICYCLOMINE HCL 10 MG PO CAPS: ORAL_CAPSULE | ORAL | 3 refills | Status: DC

## 2022-03-24 NOTE — Progress Notes (Signed)
Patient Care Team: Elinor Parkinson as PCP - General (Physician Assistant) Nicholas Lose, MD as Consulting Physician (Hematology and Oncology) Kyung Rudd, MD as Consulting Physician (Radiation Oncology) Stark Klein, MD as Consulting Physician (General Surgery) Servando Salina, MD as Consulting Physician (Obstetrics and Gynecology)  DIAGNOSIS:  Encounter Diagnosis  Name Primary?   Malignant neoplasm of upper-outer quadrant of right breast in female, estrogen receptor positive ([place]) Yes    SUMMARY OF ONCOLOGIC HISTORY: Oncology History  Malignant neoplasm of upper-outer quadrant of right breast in female, estrogen receptor positive ([place])  12/13/2019 Initial Diagnosis   Screening detected right breast masses 11:30 position: 4.3 cm: Biopsy grade 3 IDC ER 90% PR 0%, KI 40%, HER-2 negative 12 o'clock position: 1.9 cm: Biopsy: Grade 3 IDC ER 40%, PR 0%, HER-2 negative, Ki-67 40%, right axillary lymph node biopsied benign but discordant   12/19/2019 Cancer Staging   Staging form: Breast, AJCC 8th Edition - Clinical stage from 12/19/2019: Stage IIIA (cT2, cN1, cM0, G3, ER+, PR-, HER2-) - Signed by Nicholas Lose, MD on 12/19/2019   01/03/2020 - 05/01/2020 Neo-Adjuvant Chemotherapy   Adriamycin and Cytoxan x 4 01/03/2020-02/14/2020 Taxol/Carbo x 6 02/29/2020-05/01/2020   02/28/2020 Genetic Testing   Negative genetic testing:  No pathogenic variants detected on the [facility] CustomNext-Cancer + RNAinsight panel. The report date is 02/28/2020.   The CustomNext-Cancer+RNAinsight panel offered by [facility] includes sequencing and rearrangement analysis for the following 47 genes:  APC, ATM, AXIN2, BARD1, BMPR1A, BRCA1, BRCA2, BRIP1, CDH1, CDK4, CDKN2A, CHEK2, DICER1, EPCAM, GREM1, HOXB13, MEN1, MLH1, MSH2, MSH3, MSH6, MUTYH, NBN, NF1, NF2, NTHL1, PALB2, PMS2, POLD1, POLE, PTEN, RAD51C, RAD51D, RECQL, RET, SDHA, SDHAF2, SDHB, SDHC, SDHD, SMAD4, SMARCA4, STK11, TP53, TSC1, TSC2, and VHL.   RNA data is routinely analyzed for use in variant interpretation for all genes.   06/24/2020 Surgery   Bilateral lumpectomies Barry Dienes):  Left breast: fibrocystic changes with usual ductal hyperplasia Right breast: no evidence of malignancy, with 2 right axillary lymph nodes negative for carcinoma   08/04/2020 - 09/23/2020 Radiation Therapy   Adjuvant radiation therapy 08/04/2020 through 09/23/2020 Site Technique Total Dose (Gy) Dose per Fx (Gy) Completed Fx Beam Energies  Breast, Right: Breast_Rt 3D 50.4/50.4 1.8 28/28 10X  Breast, Right: Breast_Rt_SCLV 3D 50.4/50.4 1.8 28/28 6X, 10X  Breast, Right: Breast_Rt_Bst 3D 10/10 2 5/5 6X, 10X      09/2020 -  Anti-estrogen oral therapy   Tamoxifen daily     CHIEF COMPLIANT:   Patient Care Team: Elinor Parkinson as PCP - General (Physician Assistant) Nicholas Lose, MD as Consulting Physician (Hematology and Oncology) Kyung Rudd, MD as Consulting Physician (Radiation Oncology) Stark Klein, MD as Consulting Physician (General Surgery) Servando Salina, MD as Consulting Physician (Obstetrics and Gynecology)  DIAGNOSIS:  Encounter Diagnosis  Name Primary?   Malignant neoplasm of upper-outer quadrant of right breast in female, estrogen receptor positive ([place]) Yes    SUMMARY OF ONCOLOGIC HISTORY: Oncology History  Malignant neoplasm of upper-outer quadrant of right breast in female, estrogen receptor positive ([place])  12/13/2019 Initial Diagnosis   Screening detected right breast masses 11:30 position: 4.3 cm: Biopsy grade 3 IDC ER 90% PR 0%, KI 40%, HER-2 negative 12 o'clock position: 1.9 cm: Biopsy: Grade 3 IDC ER 40%, PR 0%, HER-2 negative, Ki-67 40%, right axillary lymph node biopsied benign but discordant   12/19/2019 Cancer Staging   Staging form: Breast, AJCC 8th Edition - Clinical stage from 12/19/2019: Stage IIIA (cT2, cN1, cM0, G3, ER+, PR-,  HER2-) - Signed by Nicholas Lose, MD on 12/19/2019   01/03/2020 - 05/01/2020  Neo-Adjuvant Chemotherapy   Adriamycin and Cytoxan x 4 01/03/2020-02/14/2020 Taxol/Carbo x 6 02/29/2020-05/01/2020   02/28/2020 Genetic Testing   Negative genetic testing:  No pathogenic variants detected on the [facility] [employer]Next-Cancer + RNAinsight panel. The report date is 02/28/2020.   The CustomNext-Cancer+RNAinsight panel offered by [facility] includes sequencing and rearrangement analysis for the following 47 genes:  APC, ATM, AXIN2, BARD1, BMPR1A, BRCA1, BRCA2, BRIP1, CDH1, CDK4, CDKN2A, CHEK2, DICER1, EPCAM, GREM1, HOXB13, MEN1, MLH1, MSH2, MSH3, MSH6, MUTYH, NBN, NF1, NF2, NTHL1, PALB2, PMS2, POLD1, POLE, PTEN, RAD51C, RAD51D, RECQL, RET, SDHA, SDHAF2, SDHB, SDHC, SDHD, SMAD4, SMARCA4, STK11, TP53, TSC1, TSC2, and VHL.  RNA data is routinely analyzed for use in variant interpretation for all genes.   06/24/2020 Surgery   Bilateral lumpectomies Barry Dienes):  Left breast: fibrocystic changes with usual ductal hyperplasia Right breast: no evidence of malignancy, with 2 right axillary lymph nodes negative for carcinoma   08/04/2020 - 09/23/2020 Radiation Therapy   Adjuvant radiation therapy 08/04/2020 through 09/23/2020 Site Technique Total Dose (Gy) Dose per Fx (Gy) Completed Fx Beam Energies  Breast, Right: Breast_Rt 3D 50.4/50.4 1.8 28/28 10X  Breast, Right: Breast_Rt_SCLV 3D 50.4/50.4 1.8 28/28 6X, 10X  Breast, Right: Breast_Rt_Bst 3D 10/10 2 5/5 6X, 10X      09/2020 -  Anti-estrogen oral therapy   Tamoxifen daily     CHIEF COMPLIANT:  Tamoxifen  INTERVAL HISTORY: Cathy Parrish is a 52 y.o. with above-mentioned history of right breast cancer who completed neoadjuvant chemotherapy, underwent bilateral lumpectomies, and radiation therapy. She reports to the clinic today for follow-up. She states that the last months she has been kind of down. She denies any hot flashes or muscle cramps from taking the tamoxifen. She says she has been out of breath, a little winded when she do too much.   More when she walks. She does stay active and go to the gym. She complains of fluid build upon the right side breast.   ALLERGIES:  is allergic to latex.  MEDICATIONS:  Current Outpatient Medications  Medication Sig Dispense Refill   SUMAtriptan (IMITREX) 50 MG tablet Take by mouth.     acetaminophen (TYLENOL) 650 MG CR tablet Take 1,300 mg by mouth every 8 (eight) hours as needed for pain.     ALPRAZolam (XANAX) 0.5 MG tablet TAKE 1 TABLET(0.5 MG) BY MOUTH AT BEDTIME AS NEEDED FOR ANXIETY 30 tablet 3   amoxicillin-clavulanate (AUGMENTIN) 875-125 MG tablet Take 1 tablet by mouth 2 (two) times daily. 14 tablet 0   atorvastatin (LIPITOR) 20 MG tablet Take 1 tablet (20 mg total) by mouth daily. 90 tablet 3   Cholecalciferol (VITAMIN D) 125 MCG (5000 UT) CAPS Take 5,000 Units by mouth daily.     dicyclomine (BENTYL) 10 MG capsule TAKE 1 CAPSULE(10 MG) BY MOUTH THREE TIMES DAILY AS NEEDED FOR SPASMS 30 capsule 3   escitalopram (LEXAPRO) 20 MG tablet TAKE 1 TABLET(20 MG) BY MOUTH DAILY     furosemide (LASIX) 20 MG tablet TAKE 1 TABLET(20 MG) BY MOUTH DAILY 90 tablet 0   gabapentin (NEURONTIN) 300 MG capsule TAKE 1 CAPSULE(300 MG) BY MOUTH AT BEDTIME 90 capsule 3   ibuprofen (ADVIL) 800 MG tablet Take 800 mg by mouth every 8 (eight) hours as needed for moderate pain.     irbesartan (AVAPRO) 150 MG tablet Take 150 mg by mouth daily.     irbesartan-hydrochlorothiazide (AVALIDE) 150-12.5  MG tablet Take 1 tablet by mouth daily. 90 tablet 2   loratadine (CLARITIN) 10 MG tablet Take 10 mg by mouth daily.     meloxicam (MOBIC) 7.5 MG tablet Take 1 tablet (7.5 mg total) by mouth daily. 14 tablet 0   potassium chloride SA (KLOR-CON M) 20 MEQ tablet Take 1 tablet (20 mEq total) by mouth 2 (two) times daily. 30 tablet 1   SUMAtriptan (IMITREX) 50 MG tablet Take one tablet by mouth at the first sign of headache. May repeat in 2 hours if headache persists or recurs. (Patient taking differently: Take 50 mg by  mouth every 2 (two) hours as needed. May repeat in 2 hours if headache persists or recurs.) 10 tablet 0   tamoxifen (NOLVADEX) 20 MG tablet TAKE 1 TABLET(20 MG) BY MOUTH DAILY 90 tablet 3   tetrahydrozoline 0.05 % ophthalmic solution Place 1 drop into both eyes daily as needed (dry/irritated eyes).     No current facility-administered medications for this visit.    PHYSICAL EXAMINATION: ECOG PERFORMANCE STATUS: 1 - Symptomatic but completely ambulatory  Vitals:   03/24/22 0918  BP: 124/69  Pulse: 88  Resp: 18  Temp: (!) 97.4 F (36.3 C)  SpO2: 97%   Filed Weights   03/24/22 0918  Weight: 282 lb 4.8 oz (128.1 kg)    BREAST: No palpable masses or nodules in either right or left breasts.  Tenderness in the right axilla at the surgical scar.  No palpable axillary supraclavicular or infraclavicular adenopathy no breast tenderness or nipple discharge. (exam performed in the presence of a chaperone)  LABORATORY DATA:  I have reviewed the data as listed    Latest Ref Rng & Units 09/29/2021   12:32 AM 04/30/2021    2:34 PM 03/04/2021   10:57 AM  CMP  Glucose 70 - 99 mg/dL 196  93  129   BUN 6 - 20 mg/dL '10  14  17   '$ Creatinine 0.44 - 1.00 mg/dL 0.69  0.63  0.74   Sodium 135 - 145 mmol/L 138  136  138   Potassium 3.5 - 5.1 mmol/L 2.9  3.3  3.1   Chloride 98 - 111 mmol/L 104  99  99   CO2 22 - 32 mmol/L '23  30  28   '$ Calcium 8.9 - 10.3 mg/dL 8.7  9.3  9.2   Total Protein 6.5 - 8.1 g/dL  7.3  7.5   Total Bilirubin 0.3 - 1.2 mg/dL  0.5  0.4   Alkaline Phos 38 - 126 U/L  70  69   AST 15 - 41 U/L  14  13   ALT 0 - 44 U/L  14  12     Lab Results  Component Value Date   WBC 5.3 03/24/2022   HGB 12.8 03/24/2022   HCT 37.5 03/24/2022   MCV 83.7 03/24/2022   PLT 237 03/24/2022   NEUTROABS PENDING 03/24/2022    ASSESSMENT & PLAN:  Malignant neoplasm of upper-outer quadrant of right breast in female, estrogen receptor positive ([place]) 12/13/2019:Screening detected right breast masses  11:30 position: 4.3 cm: Biopsy grade 3 IDC ER 90% PR 0%, KI 40%, HER-2 negative 12 o'clock position: 1.9 cm: Biopsy: Grade 3 IDC ER 40% (weakly positive), PR 0%, HER-2 negative, Ki-67 40%, right axillary lymph node biopsied benign but discordant T2N?M0 stage IIa versus stage IIIa (based on the lymph node)   Treatment plan: 1.  Neoadjuvant chemotherapy with dose dense Adriamycin and Cytoxan followed  by Taxol (stopped after 7 cycles) and Carbo 2. Bilateral Lumpectomies: Complete response 2 SLN Neg 3.  Adjuvant radiation 08/08/2020-09/22/2020  4.  Followed by adjuvant antiestrogen therapy with tamoxifen  --------------------------------------------------------------------------------------------------------------------------------------------------   Tamoxifen toxicities: 1.  Hot flashes: Improved significantly and now mild to moderate.  Takes 20 mg tamoxifen daily. 2. Fatigue: Labs have been performed, CBC looks normal.     Breast cancer surveillance: Mammogram 11/26/2021: Distortion was noted on biopsy was benign, breast density category C    Ultrasound lower extremity: 01/20/2021: No evidence of DVT   Return to clinic in 1 year for follow-up    No orders of the defined types were placed in this encounter.  The patient has a good understanding of the overall plan. she agrees with it. she will call with any problems that may develop before the next visit here. Total time spent: 30 mins including face to face time and time spent for planning, charting and co-ordination of care   Harriette Ohara, MD 03/24/22    I Gardiner Coins am acting as a Education administrator for [employer]  I have reviewed the above documentation for accuracy and completeness, and I agree with the above.    INTERVAL HISTORY: Brinlynn Gorton is a   ALLERGIES:  is allergic to latex.  MEDICATIONS:  Current Outpatient Medications  Medication Sig Dispense Refill   SUMAtriptan (IMITREX) 50 MG tablet Take by mouth.      acetaminophen (TYLENOL) 650 MG CR tablet Take 1,300 mg by mouth every 8 (eight) hours as needed for pain.     ALPRAZolam (XANAX) 0.5 MG tablet TAKE 1 TABLET(0.5 MG) BY MOUTH AT BEDTIME AS NEEDED FOR ANXIETY 30 tablet 3   amoxicillin-clavulanate (AUGMENTIN) 875-125 MG tablet Take 1 tablet by mouth 2 (two) times daily. 14 tablet 0   atorvastatin (LIPITOR) 20 MG tablet Take 1 tablet (20 mg total) by mouth daily. 90 tablet 3   Cholecalciferol (VITAMIN D) 125 MCG (5000 UT) CAPS Take 5,000 Units by mouth daily.     dicyclomine (BENTYL) 10 MG capsule TAKE 1 CAPSULE(10 MG) BY MOUTH THREE TIMES DAILY AS NEEDED FOR SPASMS 30 capsule 3   escitalopram (LEXAPRO) 20 MG tablet TAKE 1 TABLET(20 MG) BY MOUTH DAILY     furosemide (LASIX) 20 MG tablet TAKE 1 TABLET(20 MG) BY MOUTH DAILY 90 tablet 0   gabapentin (NEURONTIN) 300 MG capsule TAKE 1 CAPSULE(300 MG) BY MOUTH AT BEDTIME 90 capsule 3   ibuprofen (ADVIL) 800 MG tablet Take 800 mg by mouth every 8 (eight) hours as needed for moderate pain.     irbesartan (AVAPRO) 150 MG tablet Take 150 mg by mouth daily.     irbesartan-hydrochlorothiazide (AVALIDE) 150-12.5 MG tablet Take 1 tablet by mouth daily. 90 tablet 2   loratadine (CLARITIN) 10 MG tablet Take 10 mg by mouth daily.     meloxicam (MOBIC) 7.5 MG tablet Take 1 tablet (7.5 mg total) by mouth daily. 14 tablet 0   potassium chloride SA (KLOR-CON M) 20 MEQ tablet Take 1 tablet (20 mEq total) by mouth 2 (two) times daily. 30 tablet 1   SUMAtriptan (IMITREX) 50 MG tablet Take one tablet by mouth at the first sign of headache. May repeat in 2 hours if headache persists or recurs. (Patient taking differently: Take 50 mg by mouth every 2 (two) hours as needed. May repeat in 2 hours if headache persists or recurs.) 10 tablet 0   tamoxifen (NOLVADEX) 20 MG tablet TAKE 1 TABLET(20  MG) BY MOUTH DAILY 90 tablet 3   tetrahydrozoline 0.05 % ophthalmic solution Place 1 drop into both eyes daily as needed (dry/irritated  eyes).     No current facility-administered medications for this visit.    PHYSICAL EXAMINATION: ECOG PERFORMANCE STATUS: 1 - Symptomatic but completely ambulatory  Vitals:   03/24/22 0918  BP: 124/69  Pulse: 88  Resp: 18  Temp: (!) 97.4 F (36.3 C)  SpO2: 97%   Filed Weights   03/24/22 0918  Weight: 282 lb 4.8 oz (128.1 kg)    BREAST: Tenderness in the right axillary surgical scar.  No palpable masses or nodules in either right or left breasts. No palpable axillary supraclavicular or infraclavicular adenopathy no breast tenderness or nipple discharge. (exam performed in the presence of a chaperone)  LABORATORY DATA:  I have reviewed the data as listed    Latest Ref Rng & Units 09/29/2021   12:32 AM 04/30/2021    2:34 PM 03/04/2021   10:57 AM  CMP  Glucose 70 - 99 mg/dL 196  93  129   BUN 6 - 20 mg/dL '10  14  17   '$ Creatinine 0.44 - 1.00 mg/dL 0.69  0.63  0.74   Sodium 135 - 145 mmol/L 138  136  138   Potassium 3.5 - 5.1 mmol/L 2.9  3.3  3.1   Chloride 98 - 111 mmol/L 104  99  99   CO2 22 - 32 mmol/L '23  30  28   '$ Calcium 8.9 - 10.3 mg/dL 8.7  9.3  9.2   Total Protein 6.5 - 8.1 g/dL  7.3  7.5   Total Bilirubin 0.3 - 1.2 mg/dL  0.5  0.4   Alkaline Phos 38 - 126 U/L  70  69   AST 15 - 41 U/L  14  13   ALT 0 - 44 U/L  14  12     Lab Results  Component Value Date   WBC 5.3 03/24/2022   HGB 12.8 03/24/2022   HCT 37.5 03/24/2022   MCV 83.7 03/24/2022   PLT 237 03/24/2022   NEUTROABS PENDING 03/24/2022    ASSESSMENT & PLAN:  Malignant neoplasm of upper-outer quadrant of right breast in female, estrogen receptor positive ([place]) 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%, right axillary lymph node biopsied benign but discordant T2N?M0 stage IIa versus stage IIIa (based on the lymph  node)   Treatment plan: 1.  Neoadjuvant chemotherapy with dose dense Adriamycin and Cytoxan followed by Taxol (stopped after 7 cycles) and Carbo 2. Bilateral Lumpectomies: Complete response 2 SLN Neg 3.  Adjuvant radiation 08/08/2020-09/22/2020  4.  Followed by adjuvant antiestrogen therapy with tamoxifen  --------------------------------------------------------------------------------------------------------------------------------------------------   Tamoxifen toxicities: 1.  Hot flashes: Improved significantly and now mild to moderate.  Takes 20 mg tamoxifen daily. 2. Fatigue: Labs have been performed, CBC looks normal.     Breast cancer surveillance: Mammogram 11/26/2021: Distortion was noted on biopsy was benign, breast density category C    Ultrasound lower extremity: 01/20/2021: No evidence of DVT   Return to clinic in 1 year for follow-up    No orders of the defined types were placed in this encounter.  The patient has a good understanding of the overall plan. she agrees with it. she will call with any  problems that may develop before the next visit here. Total time spent: 30 mins including face to face time and time spent for planning, charting and co-ordination of care   Harriette Ohara, MD 03/24/22    I Gardiner Coins am acting as a Education administrator for [employer]  I have reviewed the above documentation for accuracy and completeness, and I agree with the above.

## 2022-04-08 ENCOUNTER — Telehealth: Payer: Self-pay | Admitting: Hematology and Oncology

## 2022-04-08 NOTE — Telephone Encounter (Signed)
Scheduled appointment per 12/4 los. Patient is aware. 

## 2022-05-04 ENCOUNTER — Other Ambulatory Visit: Payer: Self-pay | Admitting: Hematology and Oncology

## 2022-05-23 ENCOUNTER — Ambulatory Visit (HOSPITAL_COMMUNITY)
Admission: EM | Admit: 2022-05-23 | Discharge: 2022-05-23 | Disposition: A | Discharge status: Home / Self Care | Payer: BC Managed Care – PPO | Attending: Physician Assistant | Admitting: Physician Assistant

## 2022-05-23 ENCOUNTER — Encounter (HOSPITAL_COMMUNITY): Payer: Self-pay

## 2022-05-23 DIAGNOSIS — K5289 Other specified noninfective gastroenteritis and colitis: Secondary | ICD-10-CM

## 2022-05-23 DIAGNOSIS — R112 Nausea with vomiting, unspecified: Secondary | ICD-10-CM

## 2022-05-23 MED ORDER — ONDANSETRON HCL 4 MG PO TABS: 4.0000 mg | ORAL_TABLET | Freq: Three times a day (TID) | ORAL | 0 refills | Status: DC | PRN

## 2022-05-23 NOTE — Discharge Instructions (Signed)
Advised take the Zofran 4 mg every 8 hours on a regular basis to help decrease stomach cramping and nausea.  Advised to continue to increase clear liquids, bland diet over the next 48 hours.  Advised follow-up PCP return to urgent care as needed.

## 2022-05-23 NOTE — ED Provider Notes (Signed)
[place]    CSN: [id number] Arrival date & time: 05/23/22  1056      History   Chief Complaint Chief Complaint  Patient presents with   Abdominal Pain   Diarrhea   Fatigue    HPI Cathy Parrish is a 52 y.o. female.   52 year old female presents with nausea and vomiting.  Patient indicates that 4 days ago she ate at a restaurant, she indicates her meal was shrimp and chicken.  Later that evening about 6 hours later she indicated she started having stomach cramping, nausea, with repeated vomiting.  She indicates also that the next day she started having episodes of diarrhea and loose stools.  She indicates that the nausea and vomiting and diarrhea have continued for the past 2 days.  She relates this morning she is feeling better after taking Pepto-Bismol and her symptoms have improved.  She does still feel nauseous although has not thrown up today.  She also indicates that her son had stomach virus last week and she was around him on a regular basis.  Patient indicates she has not have any fever, chills, muscle aches or pains but she does have some fatigue and weakness due to her symptoms.  She is tolerating fluids well.   Abdominal Pain Associated symptoms: diarrhea   Diarrhea Associated symptoms: abdominal pain     Past Medical History:  Diagnosis Date   Anxiety    Arthritis    shoulders   Asthma    Breast cancer ([place]) 11/2019   Depression    Diabetes mellitus without complication ([place])    diet controlled   Family history of breast cancer    Family history of colon cancer    Family history of liver cancer    Headache    Hypertension    Personal history of chemotherapy    Personal history of radiation therapy    Stroke [facility])    TIA no deficits, 6 years ago per pt    Patient Active Problem List   Diagnosis Date Noted   Chemotherapy-induced peripheral neuropathy ([place]) 11/02/2020   Genetic testing 02/28/2020   Port-A-Cath in place 01/17/2020    Family history of breast cancer    Family history of colon cancer    Family history of liver cancer    Malignant neoplasm of upper-outer quadrant of right breast in female, estrogen receptor positive ([place]) 12/19/2019   B12 deficiency 07/12/2011   NSAID long-term use-[facility] 07/12/2011   Weakness 07/11/2011   Iron deficiency anemia 07/11/2011   Hypertension     Past Surgical History:  Procedure Laterality Date   AXILLARY SENTINEL NODE BIOPSY Right 06/24/2020   Procedure: RIGHT AXILLARY SENTINEL LYMPH NODE BIOPSY;  Surgeon: Stark Klein, MD;  Location: [place];  Service: General;  Laterality: Right;   BREAST BIOPSY Right    BREAST LUMPECTOMY WITH RADIOACTIVE SEED AND SENTINEL LYMPH NODE BIOPSY Right 06/24/2020   Procedure: RIGHT BREAST LUMPECTOMY WITH RADIOACTIVE SEED X 2;  Surgeon: Stark Klein, MD;  Location: [place];  Service: General;  Laterality: Right;   COLONOSCOPY  07/13/2011   Procedure: COLONOSCOPY;  Surgeon: Gatha Mayer, MD;  Location: [place];  Service: Endoscopy;  Laterality: N/A;   DILATION AND CURETTAGE OF UTERUS  abortion   DILITATION & CURRETTAGE/HYSTROSCOPY WITH NOVASURE ABLATION N/A 09/28/2012   Procedure: DILATATION & CURETTAGE/HYSTEROSCOPY WITH NOVASURE ABLATION; And Resectoscope;  Surgeon: Marvene Staff, MD;  Location: [facility];  Service: Gynecology;  Laterality: N/A;   ESOPHAGOGASTRODUODENOSCOPY  07/13/2011   Procedure: ESOPHAGOGASTRODUODENOSCOPY (EGD);  Surgeon: Gatha Mayer, MD;  Location: [facility] ENDOSCOPY;  Service: Endoscopy;  Laterality: N/A;   PORT-A-CATH REMOVAL Left 06/24/2020   Procedure: REMOVAL PORT-A-CATH;  Surgeon: Stark Klein, MD;  Location: [place];  Service: General;  Laterality: Left;   PORTACATH PLACEMENT N/A 01/02/2020   Procedure: INSERTION PORT-A-CATH;  Surgeon: Stark Klein, MD;  Location: [place];  Service: General;  Laterality: N/A;   RADIOACTIVE SEED GUIDED EXCISIONAL BREAST BIOPSY Left 06/24/2020   Procedure: RADIOACTIVE  SEED GUIDED EXCISIONAL LEFT BREAST BIOPSY;  Surgeon: Stark Klein, MD;  Location: [place];  Service: General;  Laterality: Left;   SENTINEL NODE BIOPSY Right 06/24/2020   Procedure: RADIOACTIVE SEED LOCALIZED RIGHT LYMPH NODE BIOPSY;  Surgeon: Stark Klein, MD;  Location: [place];  Service: General;  Laterality: Right;   TONSILLECTOMY      OB History   No obstetric history on file.      Home Medications    Prior to Admission medications   Medication Sig Start Date End Date Taking? Authorizing Provider  ondansetron (ZOFRAN) 4 MG tablet Take 1 tablet (4 mg total) by mouth every 8 (eight) hours as needed for nausea or vomiting. 05/23/22  Yes Nyoka Lint, PA-C  acetaminophen (TYLENOL) 650 MG CR tablet Take 1,300 mg by mouth every 8 (eight) hours as needed for pain.    [provider]  ALPRAZolam (XANAX) 0.5 MG tablet TAKE 1 TABLET(0.5 MG) BY MOUTH AT BEDTIME AS NEEDED FOR ANXIETY 05/05/22   Nicholas Lose, MD  amoxicillin-clavulanate (AUGMENTIN) 875-125 MG tablet Take 1 tablet by mouth 2 (two) times daily. 07/28/21   Gardenia Phlegm, NP  atorvastatin (LIPITOR) 20 MG tablet Take 1 tablet (20 mg total) by mouth daily. 12/10/18   Forrest Moron, MD  Cholecalciferol (VITAMIN D) 125 MCG (5000 UT) CAPS Take 5,000 Units by mouth daily.    [provider]  dicyclomine (BENTYL) 10 MG capsule TAKE 1 CAPSULE(10 MG) BY MOUTH THREE TIMES DAILY AS NEEDED FOR SPASMS 03/24/22   Nicholas Lose, MD  escitalopram (LEXAPRO) 20 MG tablet TAKE 1 TABLET(20 MG) BY MOUTH DAILY 12/22/21   [provider]  furosemide (LASIX) 20 MG tablet TAKE 1 TABLET(20 MG) BY MOUTH DAILY 02/17/21   Nicholas Lose, MD  gabapentin (NEURONTIN) 300 MG capsule TAKE 1 CAPSULE(300 MG) BY MOUTH AT BEDTIME 08/11/21   Nicholas Lose, MD  ibuprofen (ADVIL) 800 MG tablet Take 800 mg by mouth every 8 (eight) hours as needed for moderate pain. 03/20/20   [provider]  irbesartan (AVAPRO) 150 MG tablet Take 150  mg by mouth daily.    [provider]  irbesartan-hydrochlorothiazide (AVALIDE) 150-12.5 MG tablet Take 1 tablet by mouth daily. 07/20/19   Forrest Moron, MD  loratadine (CLARITIN) 10 MG tablet Take 10 mg by mouth daily.    [provider]  meloxicam (MOBIC) 7.5 MG tablet Take 1 tablet (7.5 mg total) by mouth daily. 02/02/22   Flossie Dibble, NP  potassium chloride SA (KLOR-CON M) 20 MEQ tablet Take 1 tablet (20 mEq total) by mouth 2 (two) times daily. 03/06/21   Nicholas Lose, MD  SUMAtriptan (IMITREX) 50 MG tablet Take one tablet by mouth at the first sign of headache. May repeat in 2 hours if headache persists or recurs. Patient taking differently: Take 50 mg by mouth every 2 (two) hours as needed. May repeat in 2 hours if headache persists or recurs. 09/04/18   Nolon Rod,  Zoe A, MD  SUMAtriptan (IMITREX) 50 MG tablet Take by mouth. 01/15/22   [provider]  tamoxifen (NOLVADEX) 20 MG tablet TAKE 1 TABLET(20 MG) BY MOUTH DAILY 09/16/21   Nicholas Lose, MD  tetrahydrozoline 0.05 % ophthalmic solution Place 1 drop into both eyes daily as needed (dry/irritated eyes).    [provider]  prochlorperazine (COMPAZINE) 10 MG tablet TAKE 1 TABLET(10 MG) BY MOUTH EVERY 6 HOURS AS NEEDED FOR NAUSEA Patient not taking: Reported on 07/23/2020 05/13/20 09/30/20  Nicholas Lose, MD    Family History Family History  Problem Relation Age of Onset   Liver cancer Father        dx early 98s   Breast cancer Maternal Aunt        dx 19s   Colon cancer Maternal Aunt        dx 62s   Cancer Paternal Uncle        unknown type, dx >50   Breast cancer Half-Sister 5   Esophageal cancer Neg Hx    Stomach cancer Neg Hx     Social History Social History   Tobacco Use   Smoking status: Never   Smokeless tobacco: Never  Vaping Use   Vaping Use: Never used  Substance Use Topics   Alcohol use: Not Currently   Drug use: No     Allergies   Latex   Review of  Systems Review of Systems  Gastrointestinal:  Positive for abdominal pain and diarrhea.     Physical Exam Triage Vital Signs ED Triage Vitals  Enc Vitals Group     BP 05/23/22 1257 122/81     Pulse Rate 05/23/22 1257 67     Resp 05/23/22 1257 16     Temp 05/23/22 1257 98.3 F (36.8 C)     Temp Source 05/23/22 1257 Oral     SpO2 05/23/22 1257 96 %     Weight --      Height --      Head Circumference --      Peak Flow --      Pain Score 05/23/22 1259 10     Pain Loc --      Pain Edu? --      Excl. in [place]? --    No data found.  Updated Vital Signs BP 122/81 (BP Location: Right Arm)   Pulse 67   Temp 98.3 F (36.8 C) (Oral)   Resp 16   SpO2 96%   Visual Acuity Right Eye Distance:   Left Eye Distance:   Bilateral Distance:    Right Eye Near:   Left Eye Near:    Bilateral Near:     Physical Exam Constitutional:      Appearance: She is well-developed.  HENT:     Mouth/Throat:     Mouth: Mucous membranes are moist.     Pharynx: Oropharynx is clear.  Cardiovascular:     Rate and Rhythm: Normal rate and regular rhythm.     Heart sounds: Normal heart sounds.  Pulmonary:     Effort: Pulmonary effort is normal.     Breath sounds: Normal breath sounds and air entry. No wheezing, rhonchi or rales.  Abdominal:     General: Abdomen is flat. Bowel sounds are normal.     Palpations: Abdomen is soft.     Tenderness: There is generalized abdominal tenderness. There is guarding. There is no rebound.  Lymphadenopathy:     Cervical: No cervical adenopathy.  Neurological:  Mental Status: She is alert.      UC Treatments / Results  Labs (all labs ordered are listed, but only abnormal results are displayed) Labs Reviewed - No data to display  EKG   Radiology No results found.  Procedures Procedures (including critical care time)  Medications Ordered in UC Medications - No data to display  Initial Impression / Assessment and Plan / UC Course  I have  reviewed the triage vital signs and the nursing notes.  Pertinent labs & imaging results that were available during my care of the patient were reviewed by me and considered in my medical decision making (see chart for details).    Plan: The diagnosis be treated with the following: Nausea and vomiting: A.  Zofran 4 mg every 8 hours to control nausea and vomiting. 2.  Gastroenteritis: A.  Zofran 4 mg every 8 hours to control nausea and vomiting. 3.  Advised follow-up PCP return to urgent care as needed. Final Clinical Impressions(s) / UC Diagnoses   Final diagnoses:  Nausea and vomiting, unspecified vomiting type  Other noninfectious gastroenteritis     Discharge Instructions      Advised take the Zofran 4 mg every 8 hours on a regular basis to help decrease stomach cramping and nausea.  Advised to continue to increase clear liquids, bland diet over the next 48 hours.  Advised follow-up PCP return to urgent care as needed.    ED Prescriptions     Medication Sig Dispense Auth. Provider   ondansetron (ZOFRAN) 4 MG tablet Take 1 tablet (4 mg total) by mouth every 8 (eight) hours as needed for nausea or vomiting. 20 tablet Nyoka Lint, PA-C      PDMP not reviewed this encounter.   Nyoka Lint, PA-C 05/23/22 1336

## 2022-05-23 NOTE — ED Triage Notes (Signed)
Patient c/o mid abdominal pain, diarrhea, and fatigue x 4 days. Patient states symptoms started after she ate at a restaurant the night before.  Patient states she has taken Pepto Bismol, ginger ale and water. Last dose of Pepto Bismol was 2 days ago.

## 2022-06-30 ENCOUNTER — Telehealth: Payer: Self-pay | Admitting: *Deleted

## 2022-06-30 NOTE — Telephone Encounter (Signed)
Received call from pt with complaint of painful right breast seroma needing to be drained.  Per MD pt needing to f/u with surgeon, Dr. Donell Beers for further evaluation and tx.  Pt notified and states she will reach out to that office.

## 2022-07-12 ENCOUNTER — Telehealth: Payer: Self-pay

## 2022-07-12 NOTE — Telephone Encounter (Signed)
Returned Pt's call regarding work accommodations. Pt states she works part time at [employer] where they are requesting she stand for more than 8 hours at a time. Pt states she requires periods of sitting related to neuropathy and edema. Encouraged Pt to wear compression stockings when standing for long periods, also printed a letter to allow Pt to have rest breaks from standing. Pt verbalized she will come and pick up letter from front desk.

## 2022-07-27 ENCOUNTER — Other Ambulatory Visit: Payer: Self-pay | Admitting: Hematology and Oncology

## 2022-08-10 ENCOUNTER — Other Ambulatory Visit: Payer: Self-pay | Admitting: Hematology and Oncology

## 2022-10-01 ENCOUNTER — Other Ambulatory Visit: Payer: Self-pay | Admitting: General Surgery

## 2022-10-18 ENCOUNTER — Other Ambulatory Visit: Payer: Self-pay | Admitting: Physician Assistant

## 2022-10-18 DIAGNOSIS — Z853 Personal history of malignant neoplasm of breast: Secondary | ICD-10-CM

## 2022-10-19 ENCOUNTER — Ambulatory Visit (HOSPITAL_COMMUNITY)
Admission: EM | Admit: 2022-10-19 | Discharge: 2022-10-19 | Disposition: A | Discharge status: Home / Self Care | Payer: BC Managed Care – PPO

## 2022-10-19 ENCOUNTER — Encounter (HOSPITAL_COMMUNITY): Payer: Self-pay | Admitting: *Deleted

## 2022-10-19 DIAGNOSIS — K1120 Sialoadenitis, unspecified: Secondary | ICD-10-CM

## 2022-10-19 MED ORDER — AMOXICILLIN-POT CLAVULANATE 875-125 MG PO TABS: 1.0000 | ORAL_TABLET | Freq: Two times a day (BID) | ORAL | 0 refills | Status: DC

## 2022-10-19 NOTE — ED Triage Notes (Signed)
Pt has had a mass under her tongue x 3 days and it is painful.

## 2022-10-19 NOTE — ED Provider Notes (Signed)
[facility]    CSN: [id number] Arrival date & time: 10/19/22  1652      History   Chief Complaint Chief Complaint  Patient presents with   Mouth Lesions    HPI Cathy Parrish is a 52 y.o. female.   Patient presents today with a 3 to 4-day history of painful mass under the right side of her tongue.  She denies any known thermal or physical injury to the area.  She denies any episodes of similar symptoms in the past.  Pain is rated 10 on a 0-10 pain scale, described as throbbing, no aggravating relieving factors identified.  She has been taking Tylenol and ibuprofen without improvement of symptoms.  She does report a decrease in her saliva production send symptoms began.  She denies any recent antibiotics.  She denies any dysphagia, difficulty swallowing, swelling of her throat, shortness of breath, fever.  She does have a history of breast cancer but is not currently receiving chemotherapy.    Past Medical History:  Diagnosis Date   Anxiety    Arthritis    shoulders   Asthma    Breast cancer ([facility]) 11/2019   Depression    Diabetes mellitus without complication ([facility])    diet controlled   Family history of breast cancer    Family history of colon cancer    Family history of liver cancer    Headache    Hypertension    Personal history of chemotherapy    Personal history of radiation therapy    Stroke [facility])    TIA no deficits, 6 years ago per pt    Patient Active Problem List   Diagnosis Date Noted   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   Genetic testing 02/28/2020   Port-A-Cath in place 01/17/2020   Family history of breast cancer    Family history of colon cancer    Family history of liver cancer    Malignant neoplasm of upper-outer quadrant of right breast in female, estrogen receptor positive ([facility]) 12/19/2019   B12 deficiency 07/12/2011   NSAID long-term use-[facility] 07/12/2011   Weakness 07/11/2011   Iron deficiency anemia  07/11/2011   Hypertension     Past Surgical History:  Procedure Laterality Date   AXILLARY SENTINEL NODE BIOPSY Right 06/24/2020   Procedure: RIGHT AXILLARY SENTINEL LYMPH NODE BIOPSY;  Surgeon: Almond Lint, MD;  [facility];  Service: General;  Laterality: Right;   BREAST BIOPSY Right    BREAST LUMPECTOMY WITH RADIOACTIVE SEED AND SENTINEL LYMPH NODE BIOPSY Right 06/24/2020   Procedure: RIGHT BREAST LUMPECTOMY WITH RADIOACTIVE SEED X 2;  Surgeon: Almond Lint, MD;  [facility];  Service: General;  Laterality: Right;   COLONOSCOPY  07/13/2011   Procedure: COLONOSCOPY;  Surgeon: Iva Boop, MD;  Location: [facility] ENDOSCOPY;  Service: Endoscopy;  Laterality: N/A;   DILATION AND CURETTAGE OF UTERUS  abortion   DILITATION & CURRETTAGE/HYSTROSCOPY WITH NOVASURE ABLATION N/A 09/28/2012   Procedure: DILATATION & CURETTAGE/HYSTEROSCOPY WITH NOVASURE ABLATION; And Resectoscope;  Surgeon: Serita Kyle, MD;  Location: [facility];  Service: Gynecology;  Laterality: N/A;   ESOPHAGOGASTRODUODENOSCOPY  07/13/2011   Procedure: ESOPHAGOGASTRODUODENOSCOPY (EGD);  Surgeon: Iva Boop, MD;  Location: [facility] ENDOSCOPY;  Service: Endoscopy;  Laterality: N/A;   PORT-A-CATH REMOVAL Left 06/24/2020   Procedure: REMOVAL PORT-A-CATH;  Surgeon: Almond Lint, MD;  [facility];  Service: General;  Laterality: Left;   PORTACATH PLACEMENT N/A 01/02/2020   Procedure: INSERTION PORT-A-CATH;  Surgeon: Donell Beers,  Darrick Huntsman, MD;  Location: [facility];  Service: General;  Laterality: N/A;   RADIOACTIVE SEED GUIDED EXCISIONAL BREAST BIOPSY Left 06/24/2020   Procedure: RADIOACTIVE SEED GUIDED EXCISIONAL LEFT BREAST BIOPSY;  Surgeon: Almond Lint, MD;  [facility];  Service: General;  Laterality: Left;   SENTINEL NODE BIOPSY Right 06/24/2020   Procedure: RADIOACTIVE SEED LOCALIZED RIGHT LYMPH NODE BIOPSY;  Surgeon: Almond Lint, MD;  [facility];  Service: General;  Laterality: Right;   TONSILLECTOMY       OB History   No obstetric history on file.      Home Medications    Prior to Admission medications   Medication Sig Start Date End Date Taking? Authorizing Provider  ALPRAZolam (XANAX) 0.5 MG tablet TAKE 1 TABLET(0.5 MG) BY MOUTH AT BEDTIME AS NEEDED FOR ANXIETY 05/05/22  Yes Serena Croissant, MD  amoxicillin-clavulanate (AUGMENTIN) 875-125 MG tablet Take 1 tablet by mouth every 12 (twelve) hours. 10/19/22  Yes Burney Calzadilla K, PA-C  atorvastatin (LIPITOR) 20 MG tablet Take 1 tablet (20 mg total) by mouth daily. 12/10/18  Yes Stallings, Manus Rudd, MD  Cholecalciferol (VITAMIN D) 125 MCG (5000 UT) CAPS Take 5,000 Units by mouth daily.   Yes [provider]  dicyclomine (BENTYL) 10 MG capsule TAKE 1 CAPSULE(10 MG) BY MOUTH THREE TIMES DAILY AS NEEDED FOR SPASMS 03/24/22  Yes Serena Croissant, MD  escitalopram (LEXAPRO) 20 MG tablet TAKE 1 TABLET(20 MG) BY MOUTH DAILY 12/22/21  Yes [provider]  furosemide (LASIX) 20 MG tablet TAKE 1 TABLET(20 MG) BY MOUTH DAILY 02/17/21  Yes Serena Croissant, MD  gabapentin (NEURONTIN) 300 MG capsule TAKE 1 CAPSULE(300 MG) BY MOUTH AT BEDTIME 07/27/22  Yes Serena Croissant, MD  irbesartan (AVAPRO) 150 MG tablet Take 150 mg by mouth daily.   Yes [provider]  MOUNJARO 5 MG/0.5ML Pen Inject into the skin. 09/24/22  Yes [provider]  potassium chloride SA (KLOR-CON M) 20 MEQ tablet Take 1 tablet (20 mEq total) by mouth 2 (two) times daily. 03/06/21  Yes Serena Croissant, MD  tamoxifen (NOLVADEX) 20 MG tablet TAKE 1 TABLET(20 MG) BY MOUTH DAILY 08/10/22  Yes Serena Croissant, MD  tetrahydrozoline 0.05 % ophthalmic solution Place 1 drop into both eyes daily as needed (dry/irritated eyes).   Yes [provider]  acetaminophen (TYLENOL) 650 MG CR tablet Take 1,300 mg by mouth every 8 (eight) hours as needed for pain.    [provider]  ibuprofen (ADVIL) 800 MG tablet Take 800 mg by mouth every 8 (eight) hours as needed for  moderate pain. 03/20/20   [provider]  loratadine (CLARITIN) 10 MG tablet Take 10 mg by mouth daily.    [provider]  meloxicam (MOBIC) 7.5 MG tablet Take 1 tablet (7.5 mg total) by mouth daily. 02/02/22   Debby Freiberg, NP  ondansetron (ZOFRAN) 4 MG tablet Take 1 tablet (4 mg total) by mouth every 8 (eight) hours as needed for nausea or vomiting. 05/23/22   Ellsworth Lennox, PA-C  SUMAtriptan (IMITREX) 50 MG tablet Take one tablet by mouth at the first sign of headache. May repeat in 2 hours if headache persists or recurs. Patient taking differently: Take 50 mg by mouth every 2 (two) hours as needed. May repeat in 2 hours if headache persists or recurs. 09/04/18   Doristine Bosworth, MD  prochlorperazine (COMPAZINE) 10 MG tablet TAKE 1 TABLET(10 MG) BY MOUTH EVERY 6 HOURS AS NEEDED FOR NAUSEA Patient not  taking: Reported on 07/23/2020 05/13/20 09/30/20  Serena Croissant, MD    Family History Family History  Problem Relation Age of Onset   Liver cancer Father        dx early 30s   Breast cancer Maternal Aunt        dx 44s   Colon cancer Maternal Aunt        dx 10s   Cancer Paternal Uncle        unknown type, dx >50   Breast cancer Half-Sister 68   Esophageal cancer Neg Hx    Stomach cancer Neg Hx     Social History Social History   Tobacco Use   Smoking status: Never   Smokeless tobacco: Never  Vaping Use   Vaping status: Never Used  Substance Use Topics   Alcohol use: Not Currently   Drug use: No     Allergies   Latex   Review of Systems Review of Systems  Constitutional:  Positive for activity change. Negative for appetite change, fatigue and fever.  HENT:  Positive for sore throat. Negative for dental problem, drooling, trouble swallowing and voice change.   Respiratory:  Negative for shortness of breath.   Gastrointestinal:  Negative for abdominal pain, diarrhea, nausea and vomiting.     Physical Exam Triage Vital Signs ED Triage Vitals   Encounter Vitals Group     BP 10/19/22 1730 121/63     Systolic BP Percentile --      Diastolic BP Percentile --      Pulse Rate 10/19/22 1730 84     Resp 10/19/22 1730 18     Temp 10/19/22 1730 98.1 F (36.7 C)     Temp Source 10/19/22 1730 Oral     SpO2 10/19/22 1730 97 %     Weight --      Height --      Head Circumference --      Peak Flow --      Pain Score 10/19/22 1727 10     Pain Loc --      Pain Education --      Exclude from Growth Chart --    No data found.  Updated Vital Signs BP 121/63 (BP Location: Left Arm)   Pulse 84   Temp 98.1 F (36.7 C) (Oral)   Resp 18   SpO2 97%   Visual Acuity Right Eye Distance:   Left Eye Distance:   Bilateral Distance:    Right Eye Near:   Left Eye Near:    Bilateral Near:     Physical Exam Vitals reviewed.  Constitutional:      General: She is awake. She is not in acute distress.    Appearance: Normal appearance. She is well-developed. She is not ill-appearing.     Comments: Very pleasant female appears stated age in no acute distress sitting comfortably in exam room  HENT:     Head: Normocephalic and atraumatic.     Right Ear: External ear normal.     Left Ear: External ear normal.     Mouth/Throat:     Tongue: No lesions.     Pharynx: Uvula midline. No oropharyngeal exudate, posterior oropharyngeal erythema or postnasal drip.     Comments: Erythematous inflamed nodule measuring approximately 2 cm by half a centimeter noted under right tongue.  No evidence of Ludwig angina.  Normal-appearing posterior oropharynx. Cardiovascular:     Rate and Rhythm: Normal rate and regular rhythm.     Heart  sounds: Normal heart sounds, S1 normal and S2 normal. No murmur heard. Pulmonary:     Effort: Pulmonary effort is normal.     Breath sounds: Normal breath sounds. No wheezing, rhonchi or rales.     Comments: Clear to auscultation bilaterally Psychiatric:        Behavior: Behavior is cooperative.      UC Treatments /  Results  Labs (all labs ordered are listed, but only abnormal results are displayed) Labs Reviewed - No data to display  EKG   Radiology No results found.  Procedures Procedures (including critical care time)  Medications Ordered in UC Medications - No data to display  Initial Impression / Assessment and Plan / UC Course  I have reviewed the triage vital signs and the nursing notes.  Pertinent labs & imaging results that were available during my care of the patient were reviewed by me and considered in my medical decision making (see chart for details).     Concern for submandibular sialoadenitis caused by stone.  Given increased swelling and pain will cover for infection with Augmentin twice daily for 7 days.  She was encouraged to gargle with warm salt water and use sour candies/food to encourage salivary production in the hopes of dislodging and removing stone.  Did discuss that sometimes these require surgical removal and encouraged her to follow-up with ENT as soon as possible.  She was given the contact information for local provider with instruction to call to schedule appointment.  She can use over-the-counter analgesics for pain relief.  Discussed that if she has any worsening or changing symptoms including additional swelling, swelling of her throat, shortness of breath, muffled voice, fever, nausea, vomiting she should be seen immediately.  Strict return precautions given.  Work excuse note provided.  Final Clinical Impressions(s) / UC Diagnoses   Final diagnoses:  Submandibular sialoadenitis     Discharge Instructions      Take antibiotic as prescribed.  Suck on hard sour candies to encourage saliva production and hopefully dislodge the stone.  Follow-up with ENT soon as possible.  Call them to schedule an appointment.  Use over-the-counter medication for symptom management.  If anything worsens and you have swelling of your throat, shortness of breath, change in your  voice, fever, nausea, vomiting you need to be seen immediately.     ED Prescriptions     Medication Sig Dispense Auth. Provider   amoxicillin-clavulanate (AUGMENTIN) 875-125 MG tablet Take 1 tablet by mouth every 12 (twelve) hours. 14 tablet Netta Fodge, Noberto Retort, PA-C      PDMP not reviewed this encounter.   Jeani Hawking, PA-C 10/19/22 1827

## 2022-10-19 NOTE — Discharge Instructions (Addendum)
Take antibiotic as prescribed.  Suck on hard sour candies to encourage saliva production and hopefully dislodge the stone.  Follow-up with ENT soon as possible.  Call them to schedule an appointment.  Use over-the-counter medication for symptom management.  If anything worsens and you have swelling of your throat, shortness of breath, change in your voice, fever, nausea, vomiting you need to be seen immediately.

## 2022-12-03 ENCOUNTER — Other Ambulatory Visit: Payer: Self-pay | Admitting: Physician Assistant

## 2022-12-03 ENCOUNTER — Ambulatory Visit
Admission: RE | Admit: 2022-12-03 | Discharge: 2022-12-03 | Disposition: A | Discharge status: Home / Self Care | Payer: BC Managed Care – PPO | Source: Ambulatory Visit | Attending: Physician Assistant | Admitting: Physician Assistant

## 2022-12-03 DIAGNOSIS — Z853 Personal history of malignant neoplasm of breast: Secondary | ICD-10-CM

## 2023-03-28 ENCOUNTER — Inpatient Hospital Stay: Payer: BC Managed Care – PPO | Attending: Hematology and Oncology | Admitting: Hematology and Oncology

## 2023-03-28 ENCOUNTER — Other Ambulatory Visit: Payer: Self-pay

## 2023-03-28 VITALS — BP 123/66 | HR 78 | Temp 97.3°F | Resp 18 | Ht 65.5 in | Wt 248.5 lb

## 2023-03-28 DIAGNOSIS — C50411 Malignant neoplasm of upper-outer quadrant of right female breast: Secondary | ICD-10-CM | POA: Diagnosis not present

## 2023-03-28 DIAGNOSIS — R413 Other amnesia: Secondary | ICD-10-CM | POA: Diagnosis not present

## 2023-03-28 DIAGNOSIS — G620 Drug-induced polyneuropathy: Secondary | ICD-10-CM | POA: Diagnosis not present

## 2023-03-28 DIAGNOSIS — R5383 Other fatigue: Secondary | ICD-10-CM | POA: Diagnosis not present

## 2023-03-28 DIAGNOSIS — Z170 Estrogen receptor positive status [ER+]: Secondary | ICD-10-CM

## 2023-03-28 DIAGNOSIS — T451X5A Adverse effect of antineoplastic and immunosuppressive drugs, initial encounter: Secondary | ICD-10-CM | POA: Insufficient documentation

## 2023-03-28 DIAGNOSIS — E119 Type 2 diabetes mellitus without complications: Secondary | ICD-10-CM | POA: Diagnosis not present

## 2023-03-28 DIAGNOSIS — Z923 Personal history of irradiation: Secondary | ICD-10-CM | POA: Insufficient documentation

## 2023-03-28 NOTE — Assessment & Plan Note (Signed)
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  Neoadjuvant chemotherapy with dose dense Adriamycin and Cytoxan followed by Taxol (stopped after 7 cycles) and Carbo 2. Bilateral Lumpectomies: Complete response 2 SLN Neg 3.  Adjuvant radiation 08/08/2020-09/22/2020  4.  Followed by adjuvant antiestrogen therapy with tamoxifen  --------------------------------------------------------------------------------------------------------------------------------------------------   Tamoxifen toxicities: 1.  Hot flashes: Improved significantly and now mild to moderate.  Takes 20 mg tamoxifen daily. 2. Fatigue: Labs have been performed, CBC looks normal.    Breast cancer surveillance:  Mammogram 11/26/2021: Distortion was noted on biopsy was benign, breast density category C  Breast exam: 03/28/2023:   Ultrasound lower extremity: 01/20/2021: No evidence of DVT   Return to clinic in 1 year for follow-up

## 2023-03-28 NOTE — Research (Unsigned)
[id number]: COGNITIVE TRAINING FOR CANCER RELATED COGNITIVE IMPAIRMENT IN BREAST CANCER SURVIVORS: A MULTI-CENTER RANDOMIZED DOUBLE- BLINDED CONTROLLED TRIAL   Patient Cathy Parrish was identified by Dr. Pamelia Hoit as a potential candidate for the above listed study.  This Clinical Research Nurse met with Cathy Parrish, [id number], on 03/28/23 in a manner and location that ensures patient privacy to discuss participation in the above listed research study.  Patient is Unaccompanied.  A copy of the informed consent document and separate HIPAA Authorization was provided to the patient.  Patient reads, speaks, and understands [place].   Patient was provided with the business card of this Nurse and encouraged to contact the research team with any questions.  Approximately 20 minutes were spent with the patient reviewing the informed consent documents.  Patient was provided the option of taking informed consent documents home to review and was encouraged to review at their convenience with their support network, including other care providers. Patient took the consent documents home to review.    The screening script was read to the pt and pt gave verbal consent to continue with screening questions. Based on screening questions pt was deemed eligible and remained interested in the study. Research RN Cathy Parrish will call pt next Thursday (04-07-23) at 10 am at [phone] to discuss the study further with pt.   Cathy Parrish BSN RN Clinical Research Nurse [facility] Direct Dial: [phone] 03/28/2023  4:07 PM

## 2023-03-28 NOTE — Progress Notes (Signed)
Patient Care Team: Leonard Downing as PCP - General (Physician Assistant) Serena Croissant, MD as Consulting Physician (Hematology and Oncology) Dorothy Puffer, MD as Consulting Physician (Radiation Oncology) Almond Lint, MD as Consulting Physician (General Surgery) Maxie Better, MD as Consulting Physician (Obstetrics and Gynecology)  DIAGNOSIS:  Encounter Diagnosis  Name Primary?   Malignant neoplasm of upper-outer quadrant of right breast in female, estrogen receptor positive (HCC) Yes    SUMMARY OF ONCOLOGIC HISTORY: Oncology History  Malignant neoplasm of upper-outer quadrant of right breast in female, estrogen receptor positive (HCC)  12/13/2019 Initial Diagnosis   Screening detected right breast masses 11:30 position: 4.3 cm: Biopsy grade 3 IDC ER 90% PR 0%, KI 40%, HER-2 negative 12 o'clock position: 1.9 cm: Biopsy: Grade 3 IDC ER 40%, PR 0%, HER-2 negative, Ki-67 40%, right axillary lymph node biopsied benign but discordant   12/19/2019 Cancer Staging   Staging form: Breast, AJCC 8th Edition - Clinical stage from 12/19/2019: Stage IIIA (cT2, cN1, cM0, G3, ER+, PR-, HER2-) - Signed by Serena Croissant, MD on 12/19/2019   01/03/2020 - 05/01/2020 Neo-Adjuvant Chemotherapy   Adriamycin and Cytoxan x 4 01/03/2020-02/14/2020 Taxol/Carbo x 6 02/29/2020-05/01/2020   02/28/2020 Genetic Testing   Negative genetic testing:  No pathogenic variants detected on the [employer] CustomNext-Cancer + RNAinsight panel. The report date is 02/28/2020.   The CustomNext-Cancer+RNAinsight panel offered by [employer] includes sequencing and rearrangement analysis for the following 47 genes:  APC, ATM, AXIN2, BARD1, BMPR1A, BRCA1, BRCA2, BRIP1, CDH1, CDK4, CDKN2A, CHEK2, DICER1, EPCAM, GREM1, HOXB13, MEN1, MLH1, MSH2, MSH3, MSH6, MUTYH, NBN, NF1, NF2, NTHL1, PALB2, PMS2, POLD1, POLE, PTEN, RAD51C, RAD51D, RECQL, RET, SDHA, SDHAF2, SDHB, SDHC, SDHD, SMAD4, SMARCA4, STK11, TP53, TSC1, TSC2, and VHL.   RNA data is routinely analyzed for use in variant interpretation for all genes.   06/24/2020 Surgery   Bilateral lumpectomies Donell Beers):  Left breast: fibrocystic changes with usual ductal hyperplasia Right breast: no evidence of malignancy, with 2 right axillary lymph nodes negative for carcinoma   08/04/2020 - 09/23/2020 Radiation Therapy   Adjuvant radiation therapy 08/04/2020 through 09/23/2020 Site Technique Total Dose (Gy) Dose per Fx (Gy) Completed Fx Beam Energies  Breast, Right: Breast_Rt 3D 50.4/50.4 1.8 28/28 10X  Breast, Right: Breast_Rt_SCLV 3D 50.4/50.4 1.8 28/28 6X, 10X  Breast, Right: Breast_Rt_Bst 3D 10/10 2 5/5 6X, 10X      09/2020 -  Anti-estrogen oral therapy   Tamoxifen daily     CHIEF COMPLIANT: Follow-up on tamoxifen therapy  HISTORY OF PRESENT ILLNESS:  History of Present Illness   The patient, a three-year cancer survivor, has been managing well post-diagnosis and treatment. She has been on [facility], which has resulted in significant weight loss of approximately 35-36 pounds since the previous summer. The weight loss has positively impacted her previously diagnosed diabetes, with her recent A1c being 5.7.  However, she continues to experience neuropathy, a side effect from her chemotherapy treatment. She also reports struggles with memory issues, another potential long-term effect of chemotherapy.  The patient has been proactive in managing her health, with regular exercise being part of her routine. She has been on various medications, including tamoxifen, which she reports is not causing any discomfort. She also takes carbapentin and Xanax, the latter not very frequently. She has been off several other medications, including Augmentin, Bento, Lasix, Zofran, Enzatron, and potassium.  She has been experiencing no chest pain or discomfort. She has a seroma, a pocket of fluid in the scar tissue, which is  not uncommon post-surgery.         ALLERGIES:  is allergic to  latex.  MEDICATIONS:  Current Outpatient Medications  Medication Sig Dispense Refill   acetaminophen (TYLENOL) 650 MG CR tablet Take 1,300 mg by mouth every 8 (eight) hours as needed for pain.     ALPRAZolam (XANAX) 0.5 MG tablet TAKE 1 TABLET(0.5 MG) BY MOUTH AT BEDTIME AS NEEDED FOR ANXIETY 30 tablet 3   atorvastatin (LIPITOR) 20 MG tablet Take 1 tablet (20 mg total) by mouth daily. 90 tablet 3   Cholecalciferol (VITAMIN D) 125 MCG (5000 UT) CAPS Take 5,000 Units by mouth daily.     escitalopram (LEXAPRO) 20 MG tablet TAKE 1 TABLET(20 MG) BY MOUTH DAILY     gabapentin (NEURONTIN) 300 MG capsule TAKE 1 CAPSULE(300 MG) BY MOUTH AT BEDTIME 90 capsule 3   ibuprofen (ADVIL) 800 MG tablet Take 800 mg by mouth every 8 (eight) hours as needed for moderate pain.     irbesartan (AVAPRO) 150 MG tablet Take 150 mg by mouth daily.     loratadine (CLARITIN) 10 MG tablet Take 10 mg by mouth daily.     meloxicam (MOBIC) 7.5 MG tablet Take 1 tablet (7.5 mg total) by mouth daily. 14 tablet 0   MOUNJARO 5 MG/0.5ML Pen Inject into the skin.     SUMAtriptan (IMITREX) 50 MG tablet Take one tablet by mouth at the first sign of headache. May repeat in 2 hours if headache persists or recurs. (Patient taking differently: Take 50 mg by mouth every 2 (two) hours as needed. May repeat in 2 hours if headache persists or recurs.) 10 tablet 0   tamoxifen (NOLVADEX) 20 MG tablet TAKE 1 TABLET(20 MG) BY MOUTH DAILY 90 tablet 3   tetrahydrozoline 0.05 % ophthalmic solution Place 1 drop into both eyes daily as needed (dry/irritated eyes).     No current facility-administered medications for this visit.    PHYSICAL EXAMINATION: ECOG PERFORMANCE STATUS: 1 - Symptomatic but completely ambulatory  Vitals:   03/28/23 1451  BP: 123/66  Pulse: 78  Resp: 18  Temp: (!) 97.3 F (36.3 C)  SpO2: 99%   Filed Weights   03/28/23 1451  Weight: 248 lb 8 oz (112.7 kg)    Physical Exam   MEASUREMENTS: WT- 35 pounds  lost BREAST: Able to raise arms fully. Presence of seroma with tenderness. SKIN: Skin described as 'a little cold'.      (exam performed in the presence of a chaperone)  LABORATORY DATA:  I have reviewed the data as listed    Latest Ref Rng & Units 03/24/2022    9:00 AM 09/29/2021   12:32 AM 04/30/2021    2:34 PM  CMP  Glucose 70 - 99 mg/dL 161  096  93   BUN 6 - 20 mg/dL 13  10  14    Creatinine 0.44 - 1.00 mg/dL 0.45  4.09  8.11   Sodium 135 - 145 mmol/L 141  138  136   Potassium 3.5 - 5.1 mmol/L 3.9  2.9  3.3   Chloride 98 - 111 mmol/L 108  104  99   CO2 22 - 32 mmol/L 26  23  30    Calcium 8.9 - 10.3 mg/dL 8.8  8.7  9.3   Total Protein 6.5 - 8.1 g/dL 6.8   7.3   Total Bilirubin 0.3 - 1.2 mg/dL 0.5   0.5   Alkaline Phos 38 - 126 U/L 60   70  AST 15 - 41 U/L 15   14   ALT 0 - 44 U/L 11   14     Lab Results  Component Value Date   WBC 5.3 03/24/2022   HGB 12.8 03/24/2022   HCT 37.5 03/24/2022   MCV 83.7 03/24/2022   PLT 237 03/24/2022   NEUTROABS 1.9 03/24/2022    ASSESSMENT & PLAN:  Malignant neoplasm of upper-outer quadrant of right breast in female, estrogen receptor positive (HCC) 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%, right axillary lymph node biopsied benign but discordant T2N?M0 stage IIa versus stage IIIa (based on the lymph node)   Treatment plan: 1.  Neoadjuvant chemotherapy with dose dense Adriamycin and Cytoxan followed by Taxol (stopped after 7 cycles) and Carbo 2. Bilateral Lumpectomies: Complete response 2 SLN Neg 3.  Adjuvant radiation 08/08/2020-09/22/2020  4.  Followed by adjuvant antiestrogen therapy with tamoxifen   --------------------------------------------------------------------------------------------------------------------------------------------------   Tamoxifen toxicities: 1.  Hot flashes: Improved significantly and now mild to moderate.  Takes 20 mg tamoxifen daily. 2. Fatigue: Labs have been performed, CBC looks normal. 3.  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  Recommended participation in the memory loss trial    Breast cancer surveillance:  Mammogram 11/26/2021: Distortion was noted on biopsy was benign, breast density category C  Breast exam: 03/28/2023: Benign palpable seroma in the right breast skin darkening of the right breast from prior radiation and tenderness   Ultrasound lower extremity: 01/20/2021: No evidence of DVT   Return to clinic in 1 year for follow-up ------------------------------------- Assessment and Plan    Breast Cancer Three years post-diagnosis, no current complaints of chest pain or discomfort. -Continue current management plan. -Plan for mammogram and ultrasound in April.  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 -Continue Gabapentin as currently prescribed.  Chemotherapy-induced Cognitive Impairment Reports of memory issues post-chemotherapy. -Consider participation in computer gaming study aimed at improving memory function.  Type 2 Diabetes Significant improvement with A1c of 5.7, down from previous diabetic range, likely secondary to weight loss of 35-36 pounds. -Continue current management plan.  General Health Maintenance -Research nurse to provide information packet on memory loss trial. -Annual follow-up appointment scheduled.      No orders of the defined types were placed in this encounter.  The patient has a good understanding of the overall plan. she agrees with it. she will call with any problems that may develop before the next visit here. Total time spent: 30 mins including face to face time and time  spent for planning, charting and co-ordination of care   Tamsen Meek, MD 03/28/23

## 2023-03-29 ENCOUNTER — Telehealth: Payer: Self-pay

## 2023-03-29 NOTE — Telephone Encounter (Signed)
[id number]: COGNITIVE TRAINING FOR CANCER RELATED COGNITIVE IMPAIRMENT IN BREAST CANCER SURVIVORS: A MULTI-CENTER RANDOMIZED DOUBLE- BLINDED CONTROLLED TRIAL   Research RN Victorino Dike called pt to arrange for follow up phone call next week about participation in this research study. Pt stated that Tuesday (04-05-23) at 10 am would work for her. RN encouraged patient to reach out with any questions or concerns related to this study and her participation. RN looks forward to talking to patient next week.   Zerita Boers BSN RN Clinical Research Nurse [facility] Direct Dial: [phone] 03/29/2023  9:56 AM

## 2023-04-05 ENCOUNTER — Telehealth: Payer: Self-pay

## 2023-04-05 NOTE — Telephone Encounter (Signed)
 [id number]: COGNITIVE TRAINING FOR CANCER RELATED COGNITIVE IMPAIRMENT IN BREAST CANCER SURVIVORS: A MULTI-CENTER RANDOMIZED DOUBLE- BLINDED CONTROLLED TRIAL   RN called pt for follow up about participation in this study. Pt reported she was able to look over the consent. Pt stated she did want to participate in this study and help future patients. Research RN made arrangements for pt to consent on 04-12-23 at 4:30pm. Pt knows to call with any questions or concerns.   Delon Pinal BSN RN Clinical Research Nurse [facility] Direct Dial: [phone] 04/05/2023  10:19 AM

## 2023-04-06 ENCOUNTER — Encounter: Payer: Self-pay | Admitting: *Deleted

## 2023-04-12 ENCOUNTER — Telehealth: Payer: Self-pay

## 2023-04-12 ENCOUNTER — Inpatient Hospital Stay: Payer: BC Managed Care – PPO | Attending: Hematology and Oncology | Admitting: *Deleted

## 2023-04-12 NOTE — Telephone Encounter (Signed)
[id number]: COGNITIVE TRAINING FOR CANCER RELATED COGNITIVE IMPAIRMENT IN BREAST CANCER SURVIVORS: A MULTI-CENTER RANDOMIZED DOUBLE- BLINDED CONTROLLED TRIAL   Research Rn Cathy Parrish called pt to inform her of ineligibility in this trial due her past history of TIA in 2013. Pt was supposed to come in today and consent for this trial. RN answered pt questions about this ineligibility. RN did introduce the DCP study to pt and she stated she would be willing to participate. Research RN will call her next week (04-22-23 at 1000) to consent and obtain this information for DCP over the phone. Pt had no questions or concerns at this time.   Cathy Parrish BSN RN Clinical Research Nurse [facility] Direct Dial: [phone] 04/12/2023  9:49 AM

## 2023-04-18 ENCOUNTER — Telehealth: Payer: Self-pay

## 2023-04-18 NOTE — Telephone Encounter (Signed)
 DCP-001: Use of a Clinical Trial Screening Tool to Address Cancer Health Disparities in the [employer] [facility] ([facility])  Research RN called pt to verify correct email so a copy of the consent for this study could be e-mailed to this pt for review. Pt agreed to have consent e-mailed. Rn will call pt on Friday 04-22-23 at 11am to obtain verbal consent and obtain the necessary information for this study. Pt knows to reach out with any questions or concerns.   Zerita Boers BSN RN Clinical Research Nurse [facility] Direct Dial: [phone] 04/18/2023  11:13 AM

## 2023-04-22 DIAGNOSIS — Z170 Estrogen receptor positive status [ER+]: Secondary | ICD-10-CM

## 2023-04-22 NOTE — Research (Signed)
 DCP-001: Use of a Clinical Trial Screening Tool to Address Cancer Health Disparities in the [employer] [facility] Research Program [facility])  Patient Cathy Parrish was identified by Dr. Pamelia Hoit as a potential candidate for the above listed study.  This Clinical Research Nurse called and spoke with Zan Triska, [id number] on 04/22/23 in a manner and location that ensures patient privacy to discuss participation in the above listed research study.  Patient was emailed a copy of the informed consent and separate HIPAA from prior to consent phone call today.  Patient confirmed they have read the informed consent documents. Pt reads, speaks, and understands [place].   As outlined in the informed consent form, this Nurse and Misk Galentine discussed the purpose of the research study, the investigational nature of the study, study procedures and requirements for study participation, potential risks and benefits of study participation, as well as alternatives to participation.  This study is not blinded or double-blinded. The patient understands participation is voluntary and they may withdraw from study participation at any time.  Patient understands enrollment is pending full eligibility review.   Confidentiality and how the patient's information will be used as part of study participation were discussed.  Patient was informed there is not reimbursement provided for their time and effort spent on trial participation.  After Research RN went over the study provided phone script with pt, she agreed to participate in the study and study questions/information was completed.   All questions were answered to patient's satisfaction.  The informed consent and separate HIPAA Authorization was reviewed page by page.  The patient's mental and emotional status is appropriate to provide informed consent, and the patient verbalizes an understanding of study participation.  Patient has verbally agreed to participate in  the above listed research study.  The patient was provided with a copy of the signed informed consent form and separate HIPAA Authorization form via email for their reference.  No study specific procedures were obtained prior to the signing of the informed consent document.  Approximately 20 minutes were spent with the patient reviewing the informed consent documents.  Patient was not requested to complete a Release of Information form.  Zerita Boers BSN RN Clinical Research Nurse [facility] Direct Dial: [phone] 04/22/2023  11:38 AM

## 2023-05-23 ENCOUNTER — Telehealth: Payer: Self-pay

## 2023-05-23 NOTE — Telephone Encounter (Signed)
 Pt called and states she has missed a few days of her Tamoxifen d/t pharmacy being out of the medication. She states she has began to experience dizziness, sinus congestion, scratchy throat and asks if this could be from missing Tamoxifen doses. Advised pt this sounds like seasonal allergies as pollen count was high over the weekend. She was encouraged to take OTC antihistamines and to restart Tamoxifen asap. She verbalized thanks and agreement.

## 2023-05-24 ENCOUNTER — Other Ambulatory Visit: Payer: Self-pay | Admitting: Hematology and Oncology

## 2023-05-26 ENCOUNTER — Ambulatory Visit (HOSPITAL_COMMUNITY): Admission: EM | Admit: 2023-05-26 | Discharge: 2023-05-26 | Disposition: A | Discharge status: Home / Self Care

## 2023-05-26 ENCOUNTER — Encounter (HOSPITAL_COMMUNITY): Payer: Self-pay | Admitting: Emergency Medicine

## 2023-05-26 DIAGNOSIS — R0981 Nasal congestion: Secondary | ICD-10-CM

## 2023-05-26 DIAGNOSIS — J309 Allergic rhinitis, unspecified: Secondary | ICD-10-CM

## 2023-05-26 MED ORDER — SPACER/AERO-HOLDING CHAMBERS DEVI: 1.0000 | 0 refills | Status: AC | PRN

## 2023-05-26 MED ORDER — ALBUTEROL SULFATE HFA 108 (90 BASE) MCG/ACT IN AERS: 1.0000 | INHALATION_SPRAY | Freq: Four times a day (QID) | RESPIRATORY_TRACT | 0 refills | Status: AC | PRN

## 2023-05-26 MED ORDER — FLUTICASONE PROPIONATE 50 MCG/ACT NA SUSP: 2.0000 | Freq: Every day | NASAL | 1 refills | Status: AC

## 2023-05-26 NOTE — ED Triage Notes (Signed)
 Pt reports for a week been sneezing, congested, headaches, blurry vision, scratchy throat. Taken Sudafed, and Benadryl. Reports not helping.  Is cancer patient so called the cancer center and told that was probably allergies and what she could take.

## 2023-05-26 NOTE — Discharge Instructions (Addendum)
 At this time I suspect that your symptoms are largely from your allergies.  I recommend continuing with your Claritin daily.  I also recommend adding Flonase nasal spray to assist with your symptoms.  I have sent in a prescription for this to the pharmacy that we have on file.  I do not hear any signs that concern me for potential pneumonia but it does not sound like you are moving a lot of air in your lungs.  I have sent in an albuterol rescue inhaler to assist with this.  You can use the albuterol inhaler as needed up to every 6 hours for coughing spells and shortness of breath.  I recommend taking over-the-counter medications such as Mucinex, Robitussin as needed for symptom relief.  You can also do nasal rinses/flushes with sterile solution to help with nasal congestion and pressure.  If your symptoms seem like they are getting worse or do not appear to be improving over the next week or so you can always return back here or visit your primary care provider for follow-up.

## 2023-05-26 NOTE — ED Provider Notes (Signed)
 [facility]    CSN: [id number] Arrival date & time: 05/26/23  1147      History   Chief Complaint Chief Complaint  Patient presents with   Nasal Congestion   Headache    HPI Cathy Parrish is a 53 y.o. female.   HPI  Patient is here with concerns for nasal congestion, pressure, ear pain and hearing changes She reports she does have a hx of allergies but thinks this could be more She reports a few days ago she had some scratchy throat as well as blurry vision   Interventions: Sudafed, Claritin,     Past Medical History:  Diagnosis Date   Anxiety    Arthritis    shoulders   Asthma    Breast cancer (HCC) 11/2019   Depression    Diabetes mellitus without complication (HCC)    diet controlled   Family history of breast cancer    Family history of colon cancer    Family history of liver cancer    Headache    Hypertension    Personal history of chemotherapy    Personal history of radiation therapy    Stroke (HCC)    TIA no deficits, 6 years ago per pt    Patient Active Problem List   Diagnosis Date Noted   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   Genetic testing 02/28/2020   Port-A-Cath in place 01/17/2020   Family history of breast cancer    Family history of colon cancer    Family history of liver cancer    Malignant neoplasm of upper-outer quadrant of right breast in female, estrogen receptor positive (HCC) 12/19/2019   B12 deficiency 07/12/2011   NSAID long-term use-[place] 07/12/2011   Weakness 07/11/2011   Iron deficiency anemia 07/11/2011   Hypertension     Past Surgical History:  Procedure Laterality Date   AXILLARY SENTINEL NODE BIOPSY Right 06/24/2020   Procedure: RIGHT AXILLARY SENTINEL LYMPH NODE BIOPSY;  Surgeon: Almond Lint, MD;  Location: [facility];  Service: General;  Laterality: Right;   BREAST BIOPSY Right    BREAST BIOPSY Left 12/08/2021   BREAST LUMPECTOMY Right 06/24/2020   BREAST LUMPECTOMY WITH  RADIOACTIVE SEED AND SENTINEL LYMPH NODE BIOPSY Right 06/24/2020   Procedure: RIGHT BREAST LUMPECTOMY WITH RADIOACTIVE SEED X 2;  Surgeon: Almond Lint, MD;  Location: [facility];  Service: General;  Laterality: Right;   COLONOSCOPY  07/13/2011   Procedure: COLONOSCOPY;  Surgeon: Iva Boop, MD;  Location: [facility] ENDOSCOPY;  Service: Endoscopy;  Laterality: N/A;   DILATION AND CURETTAGE OF UTERUS  abortion   DILITATION & CURRETTAGE/HYSTROSCOPY WITH NOVASURE ABLATION N/A 09/28/2012   Procedure: DILATATION & CURETTAGE/HYSTEROSCOPY WITH NOVASURE ABLATION; And Resectoscope;  Surgeon: Serita Kyle, MD;  Location: [facility];  Service: Gynecology;  Laterality: N/A;   ESOPHAGOGASTRODUODENOSCOPY  07/13/2011   Procedure: ESOPHAGOGASTRODUODENOSCOPY (EGD);  Surgeon: Iva Boop, MD;  Location: [facility] END[facility]COPY;  Service: Endoscopy;  Laterality: N/A;   PORT-A-CATH REMOVAL Left 06/24/2020   Procedure: REMOVAL PORT-A-CATH;  Surgeon: Almond Lint, MD;  Location: [facility];  Service: General;  Laterality: Left;   PORTACATH PLACEMENT N/A 01/02/2020   Procedure: INSERTION PORT-A-CATH;  Surgeon: Almond Lint, MD;  Location: [facility];  Service: General;  Laterality: N/A;   RADIOACTIVE SEED GUIDED EXCISIONAL BREAST BIOPSY Left 06/24/2020   Procedure: RADIOACTIVE SEED GUIDED EXCISIONAL LEFT BREAST BIOPSY;  Surgeon: Almond Lint, MD;  Location: [facility];  Service: General;  Laterality: Left;   SENTINEL NODE  BIOPSY Right 06/24/2020   Procedure: RADIOACTIVE SEED LOCALIZED RIGHT LYMPH NODE BIOPSY;  Surgeon: Almond Lint, MD;  Location: [facility];  Service: General;  Laterality: Right;   TONSILLECTOMY      OB History   No obstetric history on file.      Home Medications    Prior to Admission medications   Medication Sig Start Date End Date Taking? Authorizing Provider  albuterol (VENTOLIN HFA) 108 (90 Base) MCG/ACT inhaler Inhale 1-2 puffs into the lungs every 6 (six) hours as needed for wheezing or  shortness of breath. 05/26/23  Yes Quintavious Rinck E, PA-C  DULoxetine (CYMBALTA) 20 MG capsule Take 20 mg by mouth daily. 02/11/23  Yes [provider]  fluticasone (FLONASE) 50 MCG/ACT nasal spray Place 2 sprays into both nostrils daily. 05/26/23  Yes Mohsin Crum, Oswaldo Conroy, PA-C  Spacer/Aero-Holding Chambers DEVI 1 Device by Does not apply route as needed. 05/26/23  Yes Cross Jorge E, PA-C  acetaminophen (TYLENOL) 650 MG CR tablet Take 1,300 mg by mouth every 8 (eight) hours as needed for pain.    [provider]  ALPRAZolam (XANAX) 0.5 MG tablet TAKE 1 TABLET(0.5 MG) BY MOUTH AT BEDTIME AS NEEDED FOR ANXIETY 05/05/22   Serena Croissant, MD  atorvastatin (LIPITOR) 20 MG tablet Take 1 tablet (20 mg total) by mouth daily. 12/10/18   Doristine Bosworth, MD  Cholecalciferol (VITAMIN D) 125 MCG (5000 UT) CAPS Take 5,000 Units by mouth daily.    [provider]  escitalopram (LEXAPRO) 20 MG tablet TAKE 1 TABLET(20 MG) BY MOUTH DAILY 12/22/21   [provider]  gabapentin (NEURONTIN) 300 MG capsule TAKE 1 CAPSULE(300 MG) BY MOUTH AT BEDTIME 07/27/22   Serena Croissant, MD  ibuprofen (ADVIL) 800 MG tablet Take 800 mg by mouth every 8 (eight) hours as needed for moderate pain. 03/20/20   [provider]  irbesartan (AVAPRO) 150 MG tablet Take 150 mg by mouth daily.    [provider]  loratadine (CLARITIN) 10 MG tablet Take 10 mg by mouth daily.    [provider]  meloxicam (MOBIC) 7.5 MG tablet Take 1 tablet (7.5 mg total) by mouth daily. 02/02/22   Debby Freiberg, NP  MOUNJARO 5 MG/0.5ML Pen Inject into the skin. 09/24/22   [provider]  SUMAtriptan (IMITREX) 50 MG tablet Take one tablet by mouth at the first sign of headache. May repeat in 2 hours if headache persists or recurs. Patient taking differently: Take 50 mg by mouth every 2 (two) hours as needed. May repeat in 2 hours if headache persists or recurs. 09/04/18   Doristine Bosworth, MD  tamoxifen  (NOLVADEX) 20 MG tablet TAKE 1 TABLET(20 MG) BY MOUTH DAILY 05/24/23   Serena Croissant, MD  tetrahydrozoline 0.05 % ophthalmic solution Place 1 drop into both eyes daily as needed (dry/irritated eyes).    [provider]  prochlorperazine (COMPAZINE) 10 MG tablet TAKE 1 TABLET(10 MG) BY MOUTH EVERY 6 HOURS AS NEEDED FOR NAUSEA Patient not taking: Reported on 07/23/2020 05/13/20 09/30/20  Serena Croissant, MD    Family History Family History  Problem Relation Age of Onset   Liver cancer Father        dx early 28s   Breast cancer Maternal Aunt        dx 1s   Colon cancer Maternal Aunt        dx 27s   Cancer Paternal Uncle        unknown type, dx >  50   Breast cancer Half-Sister 5   Esophageal cancer Neg Hx    Stomach cancer Neg Hx     Social History Social History   Tobacco Use   Smoking status: Never   Smokeless tobacco: Never  Vaping Use   Vaping status: Never Used  Substance Use Topics   Alcohol use: Not Currently   Drug use: No     Allergies   Latex   Review of Systems Review of Systems  Constitutional:  Positive for chills and fever.  HENT:  Positive for congestion, ear pain, postnasal drip, sinus pressure, sinus pain and sore throat.   Eyes:  Positive for discharge.  Respiratory:  Positive for cough (started today- predominantly dry) and shortness of breath (states she had an episode of coughing that caused some SOB).   Gastrointestinal:  Negative for diarrhea, nausea and vomiting.  Musculoskeletal:  Negative for myalgias.  Neurological:  Positive for headaches. Negative for dizziness and light-headedness.     Physical Exam Triage Vital Signs ED Triage Vitals  Encounter Vitals Group     BP 05/26/23 1222 117/77     Systolic BP Percentile --      Diastolic BP Percentile --      Pulse Rate 05/26/23 1222 88     Resp 05/26/23 1222 19     Temp 05/26/23 1222 97.9 F (36.6 C)     Temp Source 05/26/23 1222 Oral     SpO2 05/26/23 1222 97 %     Weight --       Height --      Head Circumference --      Peak Flow --      Pain Score 05/26/23 1220 10     Pain Loc --      Pain Education --      Exclude from Growth Chart --    No data found.  Updated Vital Signs BP 117/77 (BP Location: Left Arm)   Pulse 88   Temp 97.9 F (36.6 C) (Oral)   Resp 19   SpO2 97%   Visual Acuity Right Eye Distance:   Left Eye Distance:   Bilateral Distance:    Right Eye Near:   Left Eye Near:    Bilateral Near:     Physical Exam Vitals reviewed.  Constitutional:      General: She is awake.     Appearance: Normal appearance. She is well-developed and well-groomed.  HENT:     Head: Normocephalic and atraumatic.     Right Ear: Hearing, tympanic membrane and ear canal normal.     Left Ear: Hearing, tympanic membrane and ear canal normal.     Mouth/Throat:     Lips: Pink.     Mouth: Mucous membranes are moist.     Pharynx: Oropharynx is clear. Uvula midline.  Cardiovascular:     Rate and Rhythm: Normal rate and regular rhythm.     Heart sounds: Normal heart sounds.  Pulmonary:     Effort: Pulmonary effort is normal.     Breath sounds: Normal breath sounds. Decreased air movement present. No decreased breath sounds, wheezing, rhonchi or rales.  Skin:    General: Skin is warm and dry.  Neurological:     Mental Status: She is alert and oriented to person, place, and time.     GCS: GCS eye subscore is 4. GCS verbal subscore is 5. GCS motor subscore is 6.  Psychiatric:        Mood and Affect:  Mood normal.        Speech: Speech normal.        Behavior: Behavior normal. Behavior is cooperative.      UC Treatments / Results  Labs (all labs ordered are listed, but only abnormal results are displayed) Labs Reviewed - No data to display  EKG   Radiology No results found.  Procedures Procedures (including critical care time)  Medications Ordered in UC Medications - No data to display  Initial Impression / Assessment and Plan / UC Course  I  have reviewed the triage vital signs and the nursing notes.  Pertinent labs & imaging results that were available during my care of the patient were reviewed by me and considered in my medical decision making (see chart for details).     *** Final Clinical Impressions(s) / UC Diagnoses   Final diagnoses:  Allergic rhinitis, unspecified seasonality, unspecified trigger  Nasal congestion     Discharge Instructions      At this time I suspect that your symptoms are largely from your allergies.  I recommend continuing with your Claritin daily.  I also recommend adding Flonase nasal spray to assist with your symptoms.  I have sent in a prescription for this to the pharmacy that we have on file.  I do not hear any signs that concern me for potential pneumonia but it does not sound like you are moving a lot of air in your lungs.  I have sent in an albuterol rescue inhaler to assist with this.  You can use the albuterol inhaler as needed up to every 6 hours for coughing spells and shortness of breath.  I recommend taking over-the-counter medications such as Mucinex, Robitussin as needed for symptom relief.  You can also do nasal rinses/flushes with sterile solution to help with nasal congestion and pressure.  If your symptoms seem like they are getting worse or do not appear to be improving over the next week or so you can always return back here or visit your primary care provider for follow-up.     ED Prescriptions     Medication Sig Dispense Auth. Provider   albuterol (VENTOLIN HFA) 108 (90 Base) MCG/ACT inhaler Inhale 1-2 puffs into the lungs every 6 (six) hours as needed for wheezing or shortness of breath. 8 g Anabella Capshaw E, PA-C   Spacer/Aero-Holding Chambers DEVI 1 Device by Does not apply route as needed. 2 each Cosimo Schertzer E, PA-C   fluticasone (FLONASE) 50 MCG/ACT nasal spray Place 2 sprays into both nostrils daily. 11.1 mL Kapil Petropoulos E, PA-C      PDMP not reviewed this  encounter.

## 2023-06-10 ENCOUNTER — Ambulatory Visit
Admission: RE | Admit: 2023-06-10 | Discharge: 2023-06-10 | Disposition: A | Discharge status: Home / Self Care | Payer: BC Managed Care – PPO | Source: Ambulatory Visit | Attending: Physician Assistant | Admitting: Physician Assistant

## 2023-06-10 DIAGNOSIS — Z853 Personal history of malignant neoplasm of breast: Secondary | ICD-10-CM

## 2023-06-18 ENCOUNTER — Other Ambulatory Visit: Payer: Self-pay | Admitting: Physician Assistant

## 2023-07-21 ENCOUNTER — Other Ambulatory Visit: Payer: Self-pay | Admitting: Physician Assistant

## 2023-07-22 NOTE — Telephone Encounter (Signed)
 No longer under prescriber care, will refuse.  Requested Prescriptions  Refused Prescriptions Disp Refills   fluticasone  (FLONASE ) 50 MCG/ACT nasal spray [Pharmacy Med Name: FLUTICASONE  NASAL SP (120) RX] 16 g     Sig: SHAKE LIQUID AND USE 2 SPRAYS IN EACH NOSTRIL DAILY     Ear, Nose, and Throat: Nasal Preparations - Corticosteroids Failed - 07/22/2023  2:57 PM      Failed - Valid encounter within last 12 months    Recent Outpatient Visits           4 years ago Essential hypertension   Primary Care at [facility], Cathye Coca, MD   4 years ago Essential hypertension   Primary Care at [facility], [place], MD   4 years ago Need for prophylactic vaccination and inoculation against influenza   Primary Care at [facility], Cathye Coca, MD   4 years ago Newly diagnosed diabetes [facility])   Primary Care at [facility], Cathye Coca, MD   5 years ago Acute otitis externa of left ear, unspecified type   Primary Care at [facility], [place] D, PA-C

## 2023-10-19 ENCOUNTER — Other Ambulatory Visit: Payer: Self-pay | Admitting: Physician Assistant

## 2023-10-19 DIAGNOSIS — Z1231 Encounter for screening mammogram for malignant neoplasm of breast: Secondary | ICD-10-CM

## 2023-12-09 ENCOUNTER — Ambulatory Visit
Admission: RE | Admit: 2023-12-09 | Discharge: 2023-12-09 | Disposition: A | Discharge status: Home / Self Care | Source: Ambulatory Visit | Attending: Physician Assistant | Admitting: Physician Assistant

## 2023-12-09 DIAGNOSIS — Z1231 Encounter for screening mammogram for malignant neoplasm of breast: Secondary | ICD-10-CM

## 2024-02-01 ENCOUNTER — Other Ambulatory Visit: Payer: Self-pay | Admitting: Hematology and Oncology

## 2024-02-02 NOTE — Telephone Encounter (Signed)
 Please refill.

## 2024-03-28 ENCOUNTER — Inpatient Hospital Stay: Payer: BC Managed Care – PPO | Attending: Hematology and Oncology | Admitting: Hematology and Oncology

## 2024-03-28 DIAGNOSIS — Z170 Estrogen receptor positive status [ER+]: Secondary | ICD-10-CM | POA: Insufficient documentation

## 2024-03-28 DIAGNOSIS — Z1722 Progesterone receptor negative status: Secondary | ICD-10-CM | POA: Insufficient documentation

## 2024-03-28 DIAGNOSIS — Z923 Personal history of irradiation: Secondary | ICD-10-CM | POA: Diagnosis not present

## 2024-03-28 DIAGNOSIS — C50411 Malignant neoplasm of upper-outer quadrant of right female breast: Secondary | ICD-10-CM | POA: Diagnosis present

## 2024-03-28 DIAGNOSIS — Z1732 Human epidermal growth factor receptor 2 negative status: Secondary | ICD-10-CM | POA: Diagnosis not present

## 2024-03-28 DIAGNOSIS — Z79810 Long term (current) use of selective estrogen receptor modulators (SERMs): Secondary | ICD-10-CM | POA: Diagnosis not present

## 2024-03-28 MED ORDER — TAMOXIFEN CITRATE 20 MG PO TABS: 20.0000 mg | ORAL_TABLET | Freq: Every day | ORAL | 3 refills | Status: AC

## 2024-03-28 NOTE — Assessment & Plan Note (Signed)
 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  Neoadjuvant chemotherapy with dose dense Adriamycin  and Cytoxan  followed by Taxol  (stopped after 7 cycles) and Carbo 2. Bilateral Lumpectomies: Complete response 2 SLN Neg 3.  Adjuvant radiation 08/08/2020-09/22/2020  4.  Followed by adjuvant antiestrogen therapy with tamoxifen   --------------------------------------------------------------------------------------------------------------------------------------------------   Tamoxifen  toxicities: 1.  Hot flashes: Improved significantly and now mild to moderate.  Takes 20 mg tamoxifen  daily. 2. Fatigue: Labs have been performed, CBC looks normal. 3.  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  Recommended participation in the memory loss trial    Breast cancer surveillance:  Mammogram 12/09/23: benign, breast density category C  Breast exam: 03/28/2024: Benign palpable seroma in the right breast skin darkening of the right breast from prior radiation and tenderness   Ultrasound lower extremity: 01/20/2021: No evidence of DVT   Return to clinic in 1 year for follow-up

## 2024-03-28 NOTE — Progress Notes (Signed)
 "  Patient Care Team: Emilio Joesph VEAR DEVONNA as PCP - General (Physician Assistant) Odean Potts, MD as Consulting Physician (Hematology and Oncology) Dewey Rush, MD as Consulting Physician (Radiation Oncology) Aron Shoulders, MD as Consulting Physician (General Surgery) Rutherford Gain, MD as Consulting Physician (Obstetrics and Gynecology)  DIAGNOSIS:  Encounter Diagnosis  Name Primary?   Malignant neoplasm of upper-outer quadrant of right breast in female, estrogen receptor positive (HCC) Yes    SUMMARY OF ONCOLOGIC HISTORY: Oncology History  Malignant neoplasm of upper-outer quadrant of right breast in female, estrogen receptor positive (HCC)  12/13/2019 Initial Diagnosis   Screening detected right breast masses 11:30 position: 4.3 cm: Biopsy grade 3 IDC ER 90% PR 0%, KI 40%, HER-2 negative 12 o'clock position: 1.9 cm: Biopsy: Grade 3 IDC ER 40%, PR 0%, HER-2 negative, Ki-67 40%, right axillary lymph node biopsied benign but discordant   12/19/2019 Cancer Staging   Staging form: Breast, AJCC 8th Edition - Clinical stage from 12/19/2019: Stage IIIA (cT2, cN1, cM0, G3, ER+, PR-, HER2-) - Signed by Odean Potts, MD on 12/19/2019   01/03/2020 - 05/01/2020 Neo-Adjuvant Chemotherapy   Adriamycin  and Cytoxan  x 4 01/03/2020-02/14/2020 Taxol Dennice x 6 02/29/2020-05/01/2020   02/28/2020 Genetic Testing   Negative genetic testing:  No pathogenic variants detected on the [facility] CustomNext-Cancer + RNAinsight panel. The report date is 02/28/2020.   The CustomNext-Cancer+RNAinsight panel offered by [facility] includes sequencing and rearrangement analysis for the following 47 genes:  APC, ATM, AXIN2, BARD1, BMPR1A, BRCA1, BRCA2, BRIP1, CDH1, CDK4, CDKN2A, CHEK2, DICER1, EPCAM, GREM1, HOXB13, MEN1, MLH1, MSH2, MSH3, MSH6, MUTYH, NBN, NF1, NF2, NTHL1, PALB2, PMS2, POLD1, POLE, PTEN, RAD51C, RAD51D, RECQL, RET, SDHA, SDHAF2, SDHB, SDHC, SDHD, SMAD4, SMARCA4, STK11, TP53, TSC1, TSC2, and VHL.   RNA data is routinely analyzed for use in variant interpretation for all genes.   06/24/2020 Surgery   Bilateral lumpectomies [facility]):  Left breast: fibrocystic changes with usual ductal hyperplasia Right breast: no evidence of malignancy, with 2 right axillary lymph nodes negative for carcinoma   08/04/2020 - 09/23/2020 Radiation Therapy   Adjuvant radiation therapy 08/04/2020 through 09/23/2020 Site Technique Total Dose (Gy) Dose per Fx (Gy) Completed Fx Beam Energies  Breast, Right: Breast_Rt 3D 50.4/50.4 1.8 28/28 10X  Breast, Right: Breast_Rt_SCLV 3D 50.4/50.4 1.8 28/28 6X, 10X  Breast, Right: Breast_Rt_Bst 3D 10/10 2 5/5 6X, 10X      09/2020 -  Anti-estrogen oral therapy   Tamoxifen  daily     CHIEF COMPLIANT:   HISTORY OF PRESENT ILLNESS:  History of Present Illness Renda Tonie Elsey is a 54 year old female with 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  She is over four years from diagnosis and remains on tamoxifen  with only mild vasomotor symptoms and occasional fatigue. She is worried about bone fragility but has no new musculoskeletal pain or other symptoms of recurrence, and otherwise feels well.  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  She has lost significant weight with Mounjaro and reports mild fatigue but no current nausea. She exercises regularly and feels her overall health and energy have improved.  She reviewed her ongoing weight loss and exercise and asked about the duration and renewal of her handicap placard. She reports no new cancer-related concerns.  Mar 28, 2023: Follow-up visit for ongoing tamoxifen  therapy after completion of neoadjuvant chemotherapy, bilateral lumpectomies, and adjuvant radiation for stage IIIA  ER+ right breast cancer. Patient tolerating tamoxifen  well with  mild hot flashes and fatigue; persistent neuropathy and memory issues noted. Physical exam revealed benign seroma in right breast; surveillance mammogram in September 2023 was benign. Diabetes well controlled with significant weight loss; advised to continue gabapentin  and consider memory study. Next follow-up scheduled in one year.     ALLERGIES:  is allergic to latex.  MEDICATIONS:  Current Outpatient Medications  Medication Sig Dispense Refill   acetaminophen  (TYLENOL ) 650 MG CR tablet Take 1,300 mg by mouth every 8 (eight) hours as needed for pain.     albuterol  (VENTOLIN  HFA) 108 (90 Base) MCG/ACT inhaler Inhale 1-2 puffs into the lungs every 6 (six) hours as needed for wheezing or shortness of breath. 8 g 0   ALPRAZolam  (XANAX ) 0.5 MG tablet TAKE 1 TABLET(0.5 MG) BY MOUTH AT BEDTIME AS NEEDED FOR ANXIETY 30 tablet 3   atorvastatin  (LIPITOR) 20 MG tablet Take 1 tablet (20 mg total) by mouth daily. 90 tablet 3   Cholecalciferol (VITAMIN D ) 125 MCG (5000 UT) CAPS Take 5,000 Units by mouth daily.     DULoxetine (CYMBALTA) 20 MG capsule Take 20 mg by mouth daily.     escitalopram (LEXAPRO) 20 MG tablet TAKE 1 TABLET(20 MG) BY MOUTH DAILY     fluticasone  (FLONASE ) 50 MCG/ACT nasal spray Place 2 sprays into both nostrils daily. 11.1 mL 1   gabapentin  (NEURONTIN ) 300 MG capsule TAKE 1 CAPSULE(300 MG) BY MOUTH AT BEDTIME 90 capsule 3   ibuprofen  (ADVIL ) 800 MG tablet Take 800 mg by mouth every 8 (eight) hours as needed for moderate pain.     irbesartan  (AVAPRO ) 150 MG tablet Take 150 mg by mouth daily.     loratadine (CLARITIN) 10 MG tablet Take 10 mg by mouth daily.     meloxicam  (MOBIC ) 7.5 MG tablet Take 1 tablet (7.5 mg total) by mouth daily. 14 tablet 0   MOUNJARO 5 MG/0.5ML Pen Inject into the skin.     Spacer/Aero-Holding Chambers DEVI 1 Device by Does not apply route as needed. 2 each 0   SUMAtriptan  (IMITREX ) 50 MG tablet Take one tablet by mouth at the first sign of headache. May  repeat in 2 hours if headache persists or recurs. 10 tablet 0   tamoxifen  (NOLVADEX ) 20 MG tablet TAKE 1 TABLET(20 MG) BY MOUTH DAILY 90 tablet 3   tetrahydrozoline 0.05 % ophthalmic solution Place 1 drop into both eyes daily as needed (dry/irritated eyes).     No current facility-administered medications for this visit.    PHYSICAL EXAMINATION: ECOG PERFORMANCE STATUS: 1 - Symptomatic but completely ambulatory  There were no vitals filed for this visit. There were no vitals filed for this visit.  Physical Exam MEASUREMENTS: Weight- 198. BREAST: Breasts with scar tissue, tenderness, and keloid formation.  (exam performed in the presence of a chaperone)  LABORATORY DATA:  I have reviewed the data as listed    Latest Ref Rng & Units 03/24/2022    9:00 AM 09/29/2021   12:32 AM 04/30/2021    2:34 PM  CMP  Glucose 70 - 99 mg/dL 898  803  93   BUN 6 - 20 mg/dL 13  10  14    Creatinine 0.44 - 1.00 mg/dL 9.36  9.30  9.36   Sodium 135 - 145 mmol/L 141  138  136   Potassium 3.5 - 5.1 mmol/L 3.9  2.9  3.3   Chloride 98 - 111 mmol/L 108  104  99   CO2 22 - 32 mmol/L 26  23  30    Calcium  8.9 - 10.3 mg/dL 8.8  8.7  9.3   Total Protein 6.5 - 8.1 g/dL 6.8   7.3   Total Bilirubin 0.3 - 1.2 mg/dL 0.5   0.5   Alkaline Phos 38 - 126 U/L 60   70   AST 15 - 41 U/L 15   14   ALT 0 - 44 U/L 11   14     Lab Results  Component Value Date   WBC 5.3 03/24/2022   HGB 12.8 03/24/2022   HCT 37.5 03/24/2022   MCV 83.7 03/24/2022   PLT 237 03/24/2022   NEUTROABS 1.9 03/24/2022    ASSESSMENT & PLAN:  Malignant neoplasm of upper-outer quadrant of right breast in female, estrogen receptor positive (HCC) 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%, right axillary lymph node biopsied benign but discordant T2N?M0  stage IIa versus stage IIIa (based on the lymph node)   Treatment plan: 1.  Neoadjuvant chemotherapy with dose dense Adriamycin  and Cytoxan  followed by Taxol  (stopped after 7 cycles) and Carbo 2. Bilateral Lumpectomies: Complete response 2 SLN Neg 3.  Adjuvant radiation 08/08/2020-09/22/2020  4.  Followed by adjuvant antiestrogen therapy with tamoxifen  August 2022 --------------------------------------------------------------------------------------------------------------------------------------------------   Tamoxifen  toxicities: 1.  Hot flashes: Improved significantly and now mild to moderate.  Takes 20 mg tamoxifen  daily. 2. Fatigue: Labs have been performed, CBC looks normal. 3.  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  Recommended participation in the memory loss trial    Breast cancer surveillance:  Mammogram 12/09/23: benign, breast density category C  Breast exam: 03/28/2024: Benign palpable seroma in the right breast skin darkening of the right breast from prior radiation and tenderness   Ultrasound lower extremity: 01/20/2021: No evidence of DVT   Return to clinic in 1 year for follow-up ------------------------------------- Assessment and Plan Assessment & Plan Estrogen receptor positive malignant neoplasm of upper-outer quadrant of right breast Tolerates tamoxifen  with mild symptoms. Weight loss and physical activity are positive prognostic factors. - Renewed tamoxifen  prescription for 10-year adjuvant therapy unless intolerable side effects or recurrence. - Provided guidance on tamoxifen  duration and side effects. - Reassured regarding bone health benefits of tamoxifen .  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 - Confirmed ongoing gabapentin  use. - Gabapentin  refills through primary care provider.  Type 2 diabetes mellitus Managed with Mounjaro, resulting in weight loss and improved energy. Regular exercise and  improved health reported. - Reinforced exercise and healthy lifestyle.      No orders of the defined types were placed in this encounter.  The patient has a good understanding of the overall plan. she agrees with it. she will call with any problems that may develop before the next visit here.  I personally spent a total of 30 minutes in the care of the patient today including preparing to see the patient, getting/reviewing separately obtained history, performing a medically appropriate exam/evaluation, counseling and educating, placing orders, referring and communicating with other health care professionals, documenting clinical information in the EHR, independently interpreting results, communicating results, and coordinating care.   Dr.Elayne Gruver 03/28/24    "

## 2025-03-28 ENCOUNTER — Inpatient Hospital Stay: Admitting: Hematology and Oncology
